# Patient Record
Sex: Male | Born: 1975 | Hispanic: Yes | Marital: Married | Smoking: Former smoker
Health system: Southern US, Community
[De-identification: ages and names within clinical notes are randomized; demographics above are authoritative.]

## PROBLEM LIST (undated history)

## (undated) DIAGNOSIS — G709 Myoneural disorder, unspecified: Secondary | ICD-10-CM

## (undated) DIAGNOSIS — I10 Essential (primary) hypertension: Secondary | ICD-10-CM

## (undated) DIAGNOSIS — E119 Type 2 diabetes mellitus without complications: Secondary | ICD-10-CM

## (undated) HISTORY — PX: UMBILICAL HERNIA REPAIR: SHX196

## (undated) HISTORY — PX: LUMBAR DISC SURGERY: SHX700

---

## 2008-05-14 HISTORY — PX: UMBILICAL HERNIA REPAIR: SHX196

## 2008-05-14 HISTORY — PX: LUMBAR DISC SURGERY: SHX700

## 2009-08-22 ENCOUNTER — Ambulatory Visit: Payer: Self-pay | Admitting: Family Medicine

## 2009-08-26 ENCOUNTER — Ambulatory Visit: Payer: Self-pay | Admitting: Internal Medicine

## 2009-10-07 ENCOUNTER — Ambulatory Visit: Payer: Self-pay | Admitting: Surgery

## 2009-10-11 ENCOUNTER — Ambulatory Visit: Payer: Self-pay | Admitting: Surgery

## 2009-11-16 ENCOUNTER — Ambulatory Visit: Payer: Self-pay | Admitting: Orthopedic Surgery

## 2009-12-05 ENCOUNTER — Encounter: Admission: RE | Admit: 2009-12-05 | Discharge: 2009-12-05 | Payer: Self-pay | Admitting: Neurosurgery

## 2009-12-23 ENCOUNTER — Encounter: Admission: RE | Admit: 2009-12-23 | Discharge: 2009-12-23 | Payer: Self-pay | Admitting: Neurosurgery

## 2010-03-17 ENCOUNTER — Encounter
Admission: RE | Admit: 2010-03-17 | Discharge: 2010-03-17 | Payer: Self-pay | Source: Home / Self Care | Admitting: Neurosurgery

## 2010-04-27 ENCOUNTER — Ambulatory Visit (HOSPITAL_COMMUNITY)
Admission: RE | Admit: 2010-04-27 | Discharge: 2010-04-28 | Payer: Self-pay | Source: Home / Self Care | Attending: Neurosurgery | Admitting: Neurosurgery

## 2010-07-25 LAB — CBC
HCT: 45.9 % (ref 39.0–52.0)
MCHC: 34 g/dL (ref 30.0–36.0)
MCV: 95.6 fL (ref 78.0–100.0)
RDW: 12.6 % (ref 11.5–15.5)

## 2013-03-04 ENCOUNTER — Ambulatory Visit: Payer: Self-pay | Admitting: Internal Medicine

## 2014-09-17 DIAGNOSIS — F17200 Nicotine dependence, unspecified, uncomplicated: Secondary | ICD-10-CM | POA: Insufficient documentation

## 2014-11-23 ENCOUNTER — Emergency Department
Admission: EM | Admit: 2014-11-23 | Discharge: 2014-11-23 | Disposition: A | Discharge status: Home / Self Care | Payer: No Typology Code available for payment source | Attending: Emergency Medicine | Admitting: Emergency Medicine

## 2014-11-23 ENCOUNTER — Emergency Department: Payer: No Typology Code available for payment source

## 2014-11-23 ENCOUNTER — Other Ambulatory Visit: Payer: Self-pay

## 2014-11-23 ENCOUNTER — Encounter: Payer: Self-pay | Admitting: Emergency Medicine

## 2014-11-23 DIAGNOSIS — R079 Chest pain, unspecified: Secondary | ICD-10-CM | POA: Diagnosis present

## 2014-11-23 DIAGNOSIS — E1165 Type 2 diabetes mellitus with hyperglycemia: Secondary | ICD-10-CM | POA: Diagnosis not present

## 2014-11-23 DIAGNOSIS — R0789 Other chest pain: Secondary | ICD-10-CM | POA: Diagnosis not present

## 2014-11-23 DIAGNOSIS — R739 Hyperglycemia, unspecified: Secondary | ICD-10-CM

## 2014-11-23 DIAGNOSIS — Z720 Tobacco use: Secondary | ICD-10-CM | POA: Insufficient documentation

## 2014-11-23 HISTORY — DX: Type 2 diabetes mellitus without complications: E11.9

## 2014-11-23 LAB — CBC
HCT: 44.8 % (ref 40.0–52.0)
Hemoglobin: 15.1 g/dL (ref 13.0–18.0)
MCH: 33.2 pg (ref 26.0–34.0)
MCHC: 33.6 g/dL (ref 32.0–36.0)
MCV: 98.7 fL (ref 80.0–100.0)
PLATELETS: 150 10*3/uL (ref 150–440)
RBC: 4.54 MIL/uL (ref 4.40–5.90)
RDW: 12.6 % (ref 11.5–14.5)
WBC: 14.8 10*3/uL — AB (ref 3.8–10.6)

## 2014-11-23 LAB — BASIC METABOLIC PANEL
ANION GAP: 9 (ref 5–15)
BUN: 13 mg/dL (ref 6–20)
CO2: 25 mmol/L (ref 22–32)
Calcium: 9.3 mg/dL (ref 8.9–10.3)
Chloride: 102 mmol/L (ref 101–111)
Creatinine, Ser: 0.9 mg/dL (ref 0.61–1.24)
GFR calc Af Amer: >60 mL/min (ref >60)
Glucose, Bld: 316 mg/dL — ABNORMAL HIGH (ref 65–99)
POTASSIUM: 4.1 mmol/L (ref 3.5–5.1)
Sodium: 136 mmol/L (ref 135–145)

## 2014-11-23 LAB — TROPONIN I: Troponin I: <0.03 ng/mL (ref <0.031)

## 2014-11-23 MED ORDER — KETOROLAC TROMETHAMINE 30 MG/ML IJ SOLN
30.0000 mg | Freq: Once | INTRAMUSCULAR | Status: AC
  Administered 2014-11-23: 30 mg via INTRAVENOUS
  Filled 2014-11-23: qty 1

## 2014-11-23 MED ORDER — SODIUM CHLORIDE 0.9 % IV SOLN
Freq: Once | INTRAVENOUS | Status: AC
  Administered 2014-11-23: 15:00:00 via INTRAVENOUS

## 2014-11-23 MED ORDER — IBUPROFEN 800 MG PO TABS: 800.0000 mg | ORAL_TABLET | Freq: Three times a day (TID) | ORAL | Status: DC | PRN

## 2014-11-23 MED ORDER — METFORMIN HCL 500 MG PO TABS: 500.0000 mg | ORAL_TABLET | Freq: Two times a day (BID) | ORAL | Status: DC

## 2014-11-23 NOTE — Discharge Instructions (Signed)

## 2014-11-23 NOTE — ED Notes (Signed)
Pt comes to ED POV c/o chest pain bilaterally especially on the lower section of rib cage.  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  He had one moment of sharp pain that radiated towards his back.  Describes an excess amount of diaphoresis compared to his normal when working. Denies shortness of breath, dizziness, or N/V.

## 2014-11-23 NOTE — ED Notes (Signed)
Patient transported to X-ray 

## 2014-11-23 NOTE — ED Provider Notes (Addendum)
[facility] Emergency Department Provider Note     Time seen: ----------------------------------------- 2:25 PM on 11/23/2014 -----------------------------------------    I have reviewed the triage vital signs and the nursing notes.   HISTORY  Chief Complaint Chest Pain    HPI Brendan BaliGabriel Russey is a 39 y.o. male who presents to ER for chest pain bilaterally that started while working on a roof. Patient states his been working on the roof since about Sunday, there is significant heat outside right now. States the pain radiate towards his back is worse when he lays down, had a hard time breathing due to it. Denies any current shortness of breath. Pain seems to be sharp on both sides of the chest and lower axillary area.   Past Medical History  Diagnosis Date  . Diabetes mellitus without complication     There are no active problems to display for this patient.   Past Surgical History  Procedure Laterality Date  . Umbilical hernia repair      Allergies Review of patient's allergies indicates no known allergies.  Social History History  Substance Use Topics  . Smoking status: Current Every Day Smoker -- 0.25 packs/day  . Smokeless tobacco: Not on file  . Alcohol Use: Yes    Review of Systems Constitutional: Negative for fever. Eyes: Negative for visual changes. ENT: Negative for sore throat. Cardiovascular: Positive for chest pain Respiratory: Positive for shortness of breath and orthopnea Gastrointestinal: Negative for abdominal pain, vomiting and diarrhea. Genitourinary: Negative for dysuria. Musculoskeletal: Negative for back pain. Skin: Negative for rash. Neurological: Negative for headaches, focal weakness or numbness.  10-point ROS otherwise negative.  ____________________________________________   PHYSICAL EXAM:  VITAL SIGNS: ED Triage Vitals  Enc Vitals Group     BP --      Pulse --      Resp --      Temp --      Temp  src --      SpO2 --      Weight --      Height --      Head Cir --      Peak Flow --      Pain Score 11/23/14 1417 5     Pain Loc --      Pain Edu? --      Excl. in GC? --     Constitutional: Alert and oriented. Well appearing and in no distress. Eyes: Conjunctivae are normal. PERRL. Normal extraocular movements. ENT   Head: Normocephalic and atraumatic.   Nose: No congestion/rhinnorhea.   Mouth/Throat: Mucous membranes are moist.   Neck: No stridor. Hematological/Lymphatic/Immunilogical: No cervical lymphadenopathy. Cardiovascular: Normal rate, regular rhythm. Normal and symmetric distal pulses are present in all extremities. No murmurs, rubs, or gallops. Respiratory: Normal respiratory effort without tachypnea nor retractions. Breath sounds are clear and equal bilaterally. No wheezes/rales/rhonchi. Gastrointestinal: Soft and nontender. No distention. No abdominal bruits. There is no CVA tenderness. Musculoskeletal: Nontender with normal range of motion in all extremities. Reproducible bilateral inferior axillary rib tenderness Neurologic:  Normal speech and language. No gross focal neurologic deficits are appreciated. Speech is normal. No gait instability. Skin:  Skin is warm, dry and intact. No rash noted. Psychiatric: Mood and affect are normal. Speech and behavior are normal. Patient exhibits appropriate insight and judgment. ____________________________________________  EKG: Interpreted by me. Normal sinus rhythm with normal axis normal intervals. No evidence of hypertrophy or acute infarction. Rate is 80 bpm  ____________________________________________  ED COURSE:  Pertinent labs & imaging results that were available during my care of the patient were reviewed by me and considered in my medical decision making (see chart for details).  ____________________________________________    LABS (pertinent positives/negatives)  Labs Reviewed  BASIC METABOLIC  PANEL - Abnormal; Notable for the following:    Glucose, Bld 316 (*)    All other components within normal limits  CBC  TROPONIN I    RADIOLOGY Images were viewed by me  Chest x-ray is unremarkable  ____________________________________________  FINAL ASSESSMENT AND PLAN  Chest pain, hyperglycemia  Plan: Pain is likely secondary to musculoskeletal strain, heavy physical activity and heat related. Patient is stable for outpatient follow-up, encouraged Motrin as needed for symptoms. We'll need to add metformin, currently is only taking glipizide for his diabetes control.   Emily Filbert, MD   Emily Filbert, MD 11/23/14 1543  Emily Filbert, MD 11/23/14 [phone]

## 2015-08-02 DIAGNOSIS — E782 Mixed hyperlipidemia: Secondary | ICD-10-CM | POA: Insufficient documentation

## 2015-08-19 ENCOUNTER — Encounter: Payer: No Typology Code available for payment source | Attending: Internal Medicine | Admitting: *Deleted

## 2015-08-19 ENCOUNTER — Encounter: Payer: Self-pay | Admitting: *Deleted

## 2015-08-19 VITALS — BP 110/68 | Ht 65.0 in | Wt 200.6 lb

## 2015-08-19 DIAGNOSIS — E119 Type 2 diabetes mellitus without complications: Secondary | ICD-10-CM | POA: Diagnosis not present

## 2015-08-19 NOTE — Patient Instructions (Addendum)
Check blood sugars 2 x day before breakfast and 2 hrs after supper every day Exercise: Begin walking for 15 minutes  3 days a week and increase to 150 minutes/week Nothing strenuous until blood sugars less than 250 Eat 3 meals day,  1-2  snacks a day Space meals 4-6 hours apart Limit fried foods Drink plenty of water Bring blood sugar records to the next appointment Return for appointment on:  Friday September 09, 2015 [facility])

## 2015-08-19 NOTE — Progress Notes (Signed)
Diabetes Self-Management Education  Visit Type: First/Initial  Appt. Start Time: 0925 Appt. End Time: 1050  08/19/2015  [name], identified by name and date of birth, is a 40 y.o. male with a diagnosis of Diabetes: Type 2.   ASSESSMENT  Blood pressure 110/68, height 5\' 5"  (1.651 m), weight 200 lb 9.6 oz (90.992 kg). Body mass index is 33.38 kg/(m^2).      Diabetes Self-Management Education - 08/19/15 1320    Visit Information   Visit Type First/Initial   Initial Visit   Diabetes Type Type 2   Are you currently following a meal plan? No  Pt works out of town for most days and eats out for all meals.    Are you taking your medications as prescribed? No  Pt reports he thought he was taking too many mg of Metformin so he stopped taking Glipizide. Explained the actions of both medications and he needed to continue taking. He hasn't received Bydureon from pharmacy and may not be able to afford.    Date Diagnosed 3 years ago   Health Coping   How would you rate your overall health? Poor   Psychosocial Assessment   Patient Belief/Attitude about Diabetes Afraid  "not good"   Self-care barriers English as a second language   Self-management support Doctor's office;Family   Patient Concerns Nutrition/Meal planning;Medication;Glycemic Control;Problem Solving;Healthy Lifestyle   Special Needs Other (comment)  Able to read and write English but may need some materials in [place].    Preferred Learning Style Auditory   Learning Readiness Ready   How often do you need to have someone help you when you read instructions, pamphlets, or other written materials from your doctor or pharmacy? 2 - Rarely   What is the last grade level you completed in school? 9th   Complications   Last HgB A1C per patient/outside source 11.8 %  08/02/15   How often do you check your blood sugar? 1-2 times/day   Fasting Blood glucose range (mg/dL) >161>200  FBG's [phone] mg/dL   Have you had a dilated eye  exam in the past 12 months? No   Have you had a dental exam in the past 12 months? No   Are you checking your feet? Yes   How many days per week are you checking your feet? 7   Dietary Intake   Breakfast chicken biscuit, oatmeal and fruit    Lunch fast foods - no fries   Dinner steak, hamburger, chicken, fish, with salad, beans, tortilla, rice   Snack (evening) sweet snack   Beverage(s) water, unsweetened coffee, diet soda   Exercise   Exercise Type ADL's   Patient Education   Previous Diabetes Education No   Disease state  Definition of diabetes, type 1 and 2, and the diagnosis of diabetes;Factors that contribute to the development of diabetes   Nutrition management  Role of diet in the treatment of diabetes and the relationship between the three main macronutrients and blood glucose level   Physical activity and exercise  Role of exercise on diabetes management, blood pressure control and cardiac health.   Medications Reviewed patients medication for diabetes, action, purpose, timing of dose and side effects.   Monitoring Purpose and frequency of SMBG.;Identified appropriate SMBG and/or A1C goals.   Acute complications Taught treatment of hypoglycemia - the 15 rule.   Chronic complications Relationship between chronic complications and blood glucose control;Reviewed with patient heart disease, higher risk of, and prevention   Psychosocial adjustment Identified  and addressed patients feelings and concerns about diabetes   Personal strategies to promote health Review risk of smoking and offered smoking cessation;Other (comment)  Alcohol in relation to blood sugars   Individualized Goals (developed by patient)   Reducing Risk Improve blood sugars Prevent diabetes complications Lead a healthier lifestyle Become more fit Quit smoking   Outcomes   Expected Outcomes Demonstrated interest in learning. Expect positive outcomes      Individualized Plan for Diabetes Self-Management  Training:   Learning Objective:  Patient will have a greater understanding of diabetes self-management. Patient education plan is to attend individual and/or group sessions per assessed needs and concerns.   Plan:   Patient Instructions  Check blood sugars 2 x day before breakfast and 2 hrs after supper every day Exercise: Begin walking for 15 minutes  3 days a week and increase to 150 minutes/week Nothing strenuous until blood sugars less than 250 Eat 3 meals day,  1-2  snacks a day Space meals 4-6 hours apart Limit fried foods Drink plenty of water Bring blood sugar records to the next appointment Return for appointment on:  Friday September 09, 2015 at 9:00 am with [facility] (dietitian)   Expected Outcomes:  Demonstrated interest in learning. Expect positive outcomes  Education material provided:  General Meal Planning Guidelines Simple Meal Plan Patient Assistance Paperwork for Bydureon Glucose tablets Symptoms, causes and treatments of Hypoglycemia  If problems or questions, patient to contact team via:   Sharion Settler, RN, CCM, CDE [phone]  Future DSME appointment:  Friday September 09, 2015 with dietitian. Due to his work schedule (he works out of town on most days of the week), he will be seen for 1:1 appointments with the nurse and dietitian.

## 2015-09-09 ENCOUNTER — Ambulatory Visit: Payer: No Typology Code available for payment source | Admitting: Dietician

## 2015-10-05 ENCOUNTER — Encounter: Payer: Self-pay | Admitting: *Deleted

## 2019-10-13 DIAGNOSIS — L03119 Cellulitis of unspecified part of limb: Secondary | ICD-10-CM

## 2019-10-13 DIAGNOSIS — M8610 Other acute osteomyelitis, unspecified site: Secondary | ICD-10-CM

## 2019-10-13 DIAGNOSIS — L02619 Cutaneous abscess of unspecified foot: Secondary | ICD-10-CM

## 2019-10-13 DIAGNOSIS — R7309 Other abnormal glucose: Secondary | ICD-10-CM

## 2019-10-13 DIAGNOSIS — I96 Gangrene, not elsewhere classified: Secondary | ICD-10-CM

## 2019-10-13 HISTORY — DX: Cellulitis of unspecified part of limb: L03.119

## 2019-10-13 HISTORY — DX: Gangrene, not elsewhere classified: I96

## 2019-10-13 HISTORY — DX: Cellulitis of unspecified part of limb: L02.619

## 2019-10-13 HISTORY — DX: Other abnormal glucose: R73.09

## 2019-10-13 HISTORY — DX: Other acute osteomyelitis, unspecified site: M86.10

## 2019-10-17 DIAGNOSIS — E1142 Type 2 diabetes mellitus with diabetic polyneuropathy: Secondary | ICD-10-CM | POA: Insufficient documentation

## 2019-10-26 ENCOUNTER — Other Ambulatory Visit: Payer: Self-pay | Admitting: Internal Medicine

## 2019-10-26 DIAGNOSIS — E11621 Type 2 diabetes mellitus with foot ulcer: Secondary | ICD-10-CM

## 2019-10-26 DIAGNOSIS — E1140 Type 2 diabetes mellitus with diabetic neuropathy, unspecified: Secondary | ICD-10-CM

## 2019-11-03 ENCOUNTER — Other Ambulatory Visit: Payer: Self-pay | Admitting: Podiatry

## 2019-11-04 ENCOUNTER — Encounter
Admission: RE | Admit: 2019-11-04 | Discharge: 2019-11-04 | Disposition: A | Discharge status: Home / Self Care | Payer: Commercial Managed Care - PPO | Source: Ambulatory Visit | Attending: Podiatry | Admitting: Podiatry

## 2019-11-04 ENCOUNTER — Other Ambulatory Visit: Payer: Self-pay

## 2019-11-04 DIAGNOSIS — Z01818 Encounter for other preprocedural examination: Secondary | ICD-10-CM | POA: Diagnosis present

## 2019-11-04 DIAGNOSIS — E118 Type 2 diabetes mellitus with unspecified complications: Secondary | ICD-10-CM | POA: Insufficient documentation

## 2019-11-04 DIAGNOSIS — Z20822 Contact with and (suspected) exposure to covid-19: Secondary | ICD-10-CM | POA: Diagnosis not present

## 2019-11-04 HISTORY — DX: Myoneural disorder, unspecified: G70.9

## 2019-11-04 NOTE — Patient Instructions (Signed)
INSTRUCTIONS FOR SURGERY     Your surgery is scheduled for:   Friday, June 25TH     To find out your arrival time for the day of surgery,          please call [phone] between 1 pm and 3 pm on :  Thursday, June 24TH     When you arrive for surgery, report to the [place].       Do NOT stop on the first floor to register.    REMEMBER: Instructions that are not followed completely may result in serious medical risk,  up to and including death, or upon the discretion of your surgeon and anesthesiologist,            your surgery may need to be rescheduled.  __X__ 1. Do not eat food after midnight the night before your procedure.                    No gum, candy, lozenger, tic tacs, tums or hard candies.                  ABSOLUTELY NOTHING SOLID IN YOUR MOUTH AFTER MIDNIGHT                    You may drink unlimited clear liquids up to 2 hours before you are scheduled to arrive for surgery.                   Do not drink anything within those 2 hours unless you need to take medicine, then take the                   smallest amount you need.  Clear liquids include:  water, apple juice without pulp,                   any flavor Gatorade, Black coffee, black tea.  Sugar may be added but no dairy/ honey /lemon.                        Broth and jello is not considered a clear liquid.  __x__  2. On the morning of surgery, please brush your teeth with toothpaste and water. You may rinse with                  mouthwash if you wish but DO NOT SWALLOW TOOTHPASTE OR MOUTHWASH  __X___3. NO alcohol for 24 hours before or after surgery.  __x___ 4.  Do NOT smoke or use e-cigarettes for 24 HOURS PRIOR TO SURGERY.                      DO NOT Use any chewable tobacco products for at least 6 hours prior to surgery.  __x___ 5. If you start any new medication after this appointment and prior to surgery, please                    Bring it with you on the day of surgery.  ___x__ 6. Notify your doctor if there is any change in  your medical condition, such as fever,                  infection, vomitting, diarrhea or any open sores.  __x___ 7.  USE the CHG SOAP as instructed, the night before surgery and the day of surgery.                   Once you have washed with this soap, do NOT use any of the following: Powders, perfumes                    or lotions. Please do not wear make up, hairpins, clips or nail polish. You may  wear deodorant.                   Men may shave their face and neck.  Women need to shave 48 hours prior to surgery.                   DO NOT wear ANY jewelry on the day of surgery. If there are rings that are too tight to                    remove easily, please address this prior to the surgery day. Piercings need to be removed.                                                                     NO METAL ON YOUR BODY.                    Do NOT bring any valuables.  If you came to Pre-Admit testing then you will not need license,                     insurance card or credit card.  If you will be staying overnight, please either leave your things in                     the car or have your family be responsible for these items.                     [facility] IS NOT RESPONSIBLE FOR BELONGINGS OR VALUABLES.  ___X__ 8. DO NOT wear contact lenses on surgery day.  You may not have dentures,                     Hearing aides, contacts or glasses in the operating room. These items can be                    Placed in the Recovery Room to receive immediately after surgery.  __x___ 9. IF YOU ARE SCHEDULED TO GO HOME ON THE SAME DAY, YOU MUST                   Have someone to drive you home and to stay with you  for the first 24 hours.                    Have an arrangement prior to arriving on surgery day.  ___x__ 10. Take the following medications on  the morning of surgery with a sip of water:                               1.                     2.                      3.                     4.                     5.                     6.  __X___ 11.  Follow any instructions provided to you by your surgeon.                        Such as enema, clear liquid bowel prep                     ##PLEASE DRINK ALL OF THE G2 DRINK AND FINISH IT BY 2 HOURS                        PRIOR TO ARRIVING AT [place].##  __X__  12. STOP ALL ASPIRIN PRODUCTS NOW                       THIS INCLUDES BC POWDERS / GOODIES POWDER  __x___ 13. STOP Anti-inflammatories as of: NOW                      This includes IBUPROFEN / MOTRIN / ADVIL / ALEVE/ NAPROXYN                    YOU MAY TAKE TYLENOL ANY TIME PRIOR TO SURGERY.  ___X__ 14.  Stop supplements until after surgery.                     This includes: OMEGA 3                  __X____16.  Stop Metformin 2 full days prior to surgery.  Stop on: NOW, Wednesday.                  DO NOT TAKE GLIPIZIDE OR METFORMIN ON SURGERY DAY.                   DO TAKE GLIPIZIDE ON Thursday.                     Do NOT take any diabetes medications on surgery day.  ___X___17.  Continue to take the following medications but do not take on the morning of surgery:                          GLIPIZIDE // METFORMIN  ____X__18. If staying overnight, please have appropriate shoes to wear to be able to walk around the unit.                   Wear clean and comfortable clothing to the hospital.  PLEASE BRING PHONE [place].  WEAR  LOOSE AND STRETCHY PANTS OR SHORTS SO YOUR FOOT WILL FIT THRU THE   LEG WITH A LARGE BANDAGE ON IT.

## 2019-11-05 ENCOUNTER — Encounter
Admission: RE | Admit: 2019-11-05 | Discharge: 2019-11-05 | Disposition: A | Discharge status: Home / Self Care | Payer: Commercial Managed Care - PPO | Source: Ambulatory Visit | Attending: Podiatry | Admitting: Podiatry

## 2019-11-05 DIAGNOSIS — Z01818 Encounter for other preprocedural examination: Secondary | ICD-10-CM | POA: Diagnosis not present

## 2019-11-05 LAB — CBC
HCT: 39 % (ref 39.0–52.0)
Hemoglobin: 13.9 g/dL (ref 13.0–17.0)
MCH: 33.1 pg (ref 26.0–34.0)
MCHC: 35.6 g/dL (ref 30.0–36.0)
MCV: 92.9 fL (ref 80.0–100.0)
Platelets: 259 10*3/uL (ref 150–400)
RBC: 4.2 MIL/uL — ABNORMAL LOW (ref 4.22–5.81)
RDW: 11 % — ABNORMAL LOW (ref 11.5–15.5)
WBC: 7.6 10*3/uL (ref 4.0–10.5)
nRBC: 0 % (ref 0.0–0.2)

## 2019-11-05 LAB — SARS CORONAVIRUS 2 (TAT 6-24 HRS): SARS Coronavirus 2: NEGATIVE

## 2019-11-06 ENCOUNTER — Ambulatory Visit: Payer: Commercial Managed Care - PPO | Admitting: Certified Registered Nurse Anesthetist

## 2019-11-06 ENCOUNTER — Ambulatory Visit
Admission: RE | Admit: 2019-11-06 | Discharge: 2019-11-06 | Disposition: A | Discharge status: Home / Self Care | Payer: Commercial Managed Care - PPO | Attending: Podiatry | Admitting: Podiatry

## 2019-11-06 ENCOUNTER — Other Ambulatory Visit: Payer: Self-pay

## 2019-11-06 ENCOUNTER — Encounter: Payer: Self-pay | Admitting: Podiatry

## 2019-11-06 ENCOUNTER — Encounter
Admission: RE | Disposition: A | Discharge status: Home / Self Care | Payer: Self-pay | Source: Home / Self Care | Attending: Podiatry

## 2019-11-06 DIAGNOSIS — E1152 Type 2 diabetes mellitus with diabetic peripheral angiopathy with gangrene: Secondary | ICD-10-CM | POA: Insufficient documentation

## 2019-11-06 DIAGNOSIS — E1165 Type 2 diabetes mellitus with hyperglycemia: Secondary | ICD-10-CM | POA: Diagnosis not present

## 2019-11-06 DIAGNOSIS — Z79899 Other long term (current) drug therapy: Secondary | ICD-10-CM | POA: Insufficient documentation

## 2019-11-06 DIAGNOSIS — Z87891 Personal history of nicotine dependence: Secondary | ICD-10-CM | POA: Insufficient documentation

## 2019-11-06 DIAGNOSIS — E1169 Type 2 diabetes mellitus with other specified complication: Secondary | ICD-10-CM | POA: Insufficient documentation

## 2019-11-06 DIAGNOSIS — E11621 Type 2 diabetes mellitus with foot ulcer: Secondary | ICD-10-CM | POA: Diagnosis not present

## 2019-11-06 DIAGNOSIS — L97522 Non-pressure chronic ulcer of other part of left foot with fat layer exposed: Secondary | ICD-10-CM | POA: Insufficient documentation

## 2019-11-06 DIAGNOSIS — M869 Osteomyelitis, unspecified: Secondary | ICD-10-CM | POA: Insufficient documentation

## 2019-11-06 DIAGNOSIS — Z09 Encounter for follow-up examination after completed treatment for conditions other than malignant neoplasm: Secondary | ICD-10-CM

## 2019-11-06 DIAGNOSIS — L03116 Cellulitis of left lower limb: Secondary | ICD-10-CM | POA: Diagnosis not present

## 2019-11-06 DIAGNOSIS — E1142 Type 2 diabetes mellitus with diabetic polyneuropathy: Secondary | ICD-10-CM | POA: Insufficient documentation

## 2019-11-06 DIAGNOSIS — E11628 Type 2 diabetes mellitus with other skin complications: Secondary | ICD-10-CM | POA: Diagnosis not present

## 2019-11-06 DIAGNOSIS — I96 Gangrene, not elsewhere classified: Secondary | ICD-10-CM | POA: Insufficient documentation

## 2019-11-06 DIAGNOSIS — Z7984 Long term (current) use of oral hypoglycemic drugs: Secondary | ICD-10-CM | POA: Insufficient documentation

## 2019-11-06 HISTORY — PX: AMPUTATION TOE: SHX6595

## 2019-11-06 LAB — GLUCOSE, CAPILLARY
Glucose-Capillary: 114 mg/dL — ABNORMAL HIGH (ref 70–99)
Glucose-Capillary: 107 mg/dL — ABNORMAL HIGH (ref 70–99)

## 2019-11-06 SURGERY — AMPUTATION, TOE
Anesthesia: General | Site: Toe | Laterality: Left

## 2019-11-06 MED ORDER — FENTANYL CITRATE (PF) 100 MCG/2ML IJ SOLN
INTRAMUSCULAR | Status: AC
  Filled 2019-11-06: qty 2

## 2019-11-06 MED ORDER — LACTATED RINGERS IV SOLN: INTRAVENOUS | Status: DC

## 2019-11-06 MED ORDER — BUPIVACAINE HCL (PF) 0.5 % IJ SOLN
INTRAMUSCULAR | Status: AC
  Filled 2019-11-06: qty 30

## 2019-11-06 MED ORDER — FENTANYL CITRATE (PF) 100 MCG/2ML IJ SOLN
INTRAMUSCULAR | Status: DC | PRN
  Administered 2019-11-06 (×2): 25 ug via INTRAVENOUS

## 2019-11-06 MED ORDER — NEOMYCIN-POLYMYXIN B GU 40-200000 IR SOLN
Status: DC | PRN
  Administered 2019-11-06: 2 mL

## 2019-11-06 MED ORDER — MIDAZOLAM HCL 2 MG/2ML IJ SOLN
INTRAMUSCULAR | Status: DC | PRN
  Administered 2019-11-06: 2 mg via INTRAVENOUS

## 2019-11-06 MED ORDER — FENTANYL CITRATE (PF) 100 MCG/2ML IJ SOLN: 25.0000 ug | INTRAMUSCULAR | Status: DC | PRN

## 2019-11-06 MED ORDER — POVIDONE-IODINE 7.5 % EX SOLN
Freq: Once | CUTANEOUS | Status: DC
  Filled 2019-11-06: qty 118

## 2019-11-06 MED ORDER — CHLORHEXIDINE GLUCONATE 0.12 % MT SOLN
15.0000 mL | Freq: Once | OROMUCOSAL | Status: AC
  Administered 2019-11-06: 15 mL via OROMUCOSAL

## 2019-11-06 MED ORDER — NEOMYCIN-POLYMYXIN B GU 40-200000 IR SOLN
Status: AC
  Filled 2019-11-06: qty 2

## 2019-11-06 MED ORDER — BUPIVACAINE HCL 0.5 % IJ SOLN
INTRAMUSCULAR | Status: DC | PRN
  Administered 2019-11-06: 20 mL

## 2019-11-06 MED ORDER — HYDROCODONE-ACETAMINOPHEN 5-325 MG PO TABS: 1.0000 | ORAL_TABLET | Freq: Four times a day (QID) | ORAL | 0 refills | Status: AC | PRN

## 2019-11-06 MED ORDER — CHLORHEXIDINE GLUCONATE 0.12 % MT SOLN
OROMUCOSAL | Status: AC
  Filled 2019-11-06: qty 15

## 2019-11-06 MED ORDER — OXYCODONE HCL 5 MG PO TABS
5.0000 mg | ORAL_TABLET | Freq: Once | ORAL | Status: AC | PRN
  Administered 2019-11-06: 5 mg via ORAL

## 2019-11-06 MED ORDER — OXYCODONE HCL 5 MG PO TABS
ORAL_TABLET | ORAL | Status: AC
  Filled 2019-11-06: qty 1

## 2019-11-06 MED ORDER — ACETAMINOPHEN 10 MG/ML IV SOLN: 1000.0000 mg | Freq: Once | INTRAVENOUS | Status: DC | PRN

## 2019-11-06 MED ORDER — MIDAZOLAM HCL 2 MG/2ML IJ SOLN
INTRAMUSCULAR | Status: AC
  Filled 2019-11-06: qty 2

## 2019-11-06 MED ORDER — CEFAZOLIN SODIUM-DEXTROSE 2-4 GM/100ML-% IV SOLN
INTRAVENOUS | Status: AC
  Filled 2019-11-06: qty 100

## 2019-11-06 MED ORDER — OXYCODONE HCL 5 MG/5ML PO SOLN: 5.0000 mg | Freq: Once | ORAL | Status: AC | PRN

## 2019-11-06 MED ORDER — ONDANSETRON HCL 4 MG/2ML IJ SOLN: 4.0000 mg | Freq: Once | INTRAMUSCULAR | Status: DC | PRN

## 2019-11-06 MED ORDER — PROPOFOL 500 MG/50ML IV EMUL
INTRAVENOUS | Status: DC | PRN
  Administered 2019-11-06: 100 ug/kg/min via INTRAVENOUS

## 2019-11-06 MED ORDER — VANCOMYCIN HCL 1000 MG IV SOLR
INTRAVENOUS | Status: AC
  Filled 2019-11-06: qty 1000

## 2019-11-06 MED ORDER — SODIUM CHLORIDE 0.9 % IV SOLN: INTRAVENOUS | Status: DC

## 2019-11-06 MED ORDER — ORAL CARE MOUTH RINSE: 15.0000 mL | Freq: Once | OROMUCOSAL | Status: AC

## 2019-11-06 MED ORDER — PROPOFOL 10 MG/ML IV BOLUS
INTRAVENOUS | Status: AC
  Filled 2019-11-06: qty 40

## 2019-11-06 MED ORDER — CEFAZOLIN SODIUM-DEXTROSE 2-4 GM/100ML-% IV SOLN
2.0000 g | INTRAVENOUS | Status: AC
  Administered 2019-11-06: 2 g via INTRAVENOUS

## 2019-11-06 MED ORDER — PHENYLEPHRINE HCL (PRESSORS) 10 MG/ML IV SOLN
INTRAVENOUS | Status: DC | PRN
  Administered 2019-11-06: 100 ug via INTRAVENOUS

## 2019-11-06 MED ORDER — ONDANSETRON HCL 4 MG PO TABS: 4.0000 mg | ORAL_TABLET | Freq: Three times a day (TID) | ORAL | 0 refills | Status: AC | PRN

## 2019-11-06 SURGICAL SUPPLY — 45 items
BLADE MED AGGRESSIVE (BLADE) IMPLANT
BLADE OSC/SAGITTAL MD 5.5X18 (BLADE) ×2 IMPLANT
BLADE SURG 15 STRL LF DISP TIS (BLADE) ×1 IMPLANT
BLADE SURG 15 STRL SS (BLADE) ×1
BLADE SURG MINI STRL (BLADE) IMPLANT
BNDG CONFORM 2 STRL LF (GAUZE/BANDAGES/DRESSINGS) IMPLANT
BNDG ELASTIC 4X5.8 VLCR STR LF (GAUZE/BANDAGES/DRESSINGS) ×2 IMPLANT
BNDG ESMARK 4X12 TAN STRL LF (GAUZE/BANDAGES/DRESSINGS) ×2 IMPLANT
BNDG GAUZE 4.5X4.1 6PLY STRL (MISCELLANEOUS) ×2 IMPLANT
CANISTER SUCT 1200ML W/VALVE (MISCELLANEOUS) ×2 IMPLANT
CNTNR SPEC 2.5X3XGRAD LEK (MISCELLANEOUS) ×1
CONT SPEC 4OZ STER OR WHT (MISCELLANEOUS) ×1
CONTAINER SPEC 2.5X3XGRAD LEK (MISCELLANEOUS) ×1 IMPLANT
COVER WAND RF STERILE (DRAPES) ×2 IMPLANT
CUFF TOURN 18 STER (MISCELLANEOUS) IMPLANT
CUFF TOURN DUAL PL 12 NO SLV (MISCELLANEOUS) ×2 IMPLANT
DRAPE FLUOR MINI C-ARM 54X84 (DRAPES) IMPLANT
DURAPREP 26ML APPLICATOR (WOUND CARE) ×2 IMPLANT
ELECT REM PT RETURN 9FT ADLT (ELECTROSURGICAL) ×2
ELECTRODE REM PT RTRN 9FT ADLT (ELECTROSURGICAL) ×1 IMPLANT
GAUZE SPONGE 4X4 12PLY STRL (GAUZE/BANDAGES/DRESSINGS) ×2 IMPLANT
GAUZE XEROFORM 1X8 LF (GAUZE/BANDAGES/DRESSINGS) ×2 IMPLANT
GLOVE BIO SURGEON STRL SZ7 (GLOVE) ×2 IMPLANT
GLOVE INDICATOR 7.0 STRL GRN (GLOVE) ×2 IMPLANT
GOWN STRL REUS W/ TWL LRG LVL3 (GOWN DISPOSABLE) ×2 IMPLANT
GOWN STRL REUS W/TWL LRG LVL3 (GOWN DISPOSABLE) ×2
HANDPIECE VERSAJET DEBRIDEMENT (MISCELLANEOUS) IMPLANT
KIT TURNOVER KIT A (KITS) ×2 IMPLANT
LABEL OR SOLS (LABEL) ×2 IMPLANT
NEEDLE FILTER BLUNT 18X 1/2SAF (NEEDLE) ×1
NEEDLE FILTER BLUNT 18X1 1/2 (NEEDLE) ×1 IMPLANT
NEEDLE HYPO 25X1 1.5 SAFETY (NEEDLE) ×4 IMPLANT
NS IRRIG 500ML POUR BTL (IV SOLUTION) ×2 IMPLANT
PACK EXTREMITY (MISCELLANEOUS) ×2 IMPLANT
SOL .9 NS 3000ML IRR  AL (IV SOLUTION)
SOL .9 NS 3000ML IRR UROMATIC (IV SOLUTION) IMPLANT
SOL PREP PVP 2OZ (MISCELLANEOUS) ×2
SOLUTION PREP PVP 2OZ (MISCELLANEOUS) ×1 IMPLANT
STOCKINETTE STRL 6IN 960660 (GAUZE/BANDAGES/DRESSINGS) ×2 IMPLANT
SUT ETHILON 3-0 FS-10 30 BLK (SUTURE) ×4
SUT VIC AB 3-0 SH 27 (SUTURE) ×1
SUT VIC AB 3-0 SH 27X BRD (SUTURE) ×1 IMPLANT
SUTURE EHLN 3-0 FS-10 30 BLK (SUTURE) ×2 IMPLANT
SWAB CULTURE AMIES ANAERIB BLU (MISCELLANEOUS) ×2 IMPLANT
SYR 10ML LL (SYRINGE) ×2 IMPLANT

## 2019-11-06 NOTE — Anesthesia Preprocedure Evaluation (Signed)
Anesthesia Evaluation  Patient identified by MRN, date of birth, ID band Patient awake    Reviewed: Allergy & Precautions, NPO status , Patient's Chart, lab work & pertinent test results  History of Anesthesia Complications Negative for: history of anesthetic complications  Airway Mallampati: II  TM Distance: >3 FB Neck ROM: Full    Dental  (+) Teeth Intact, Chipped, Dental Advisory Given   Pulmonary neg pulmonary ROS, neg sleep apnea, neg COPD, Patient abstained from smoking.Not current smoker, former smoker,    Pulmonary exam normal breath sounds clear to auscultation       Cardiovascular Exercise Tolerance: Good METS(-) hypertension(-) CAD and (-) Past MI negative cardio ROS  (-) dysrhythmias  Rhythm:Regular Rate:Normal - Systolic murmurs    Neuro/Psych negative neurological ROS  negative psych ROS   GI/Hepatic neg GERD  ,(+)     (-) substance abuse  ,   Endo/Other  diabetes  Renal/GU negative Renal ROS     Musculoskeletal   Abdominal   Peds  Hematology   Anesthesia Other Findings Past Medical History: 10/2019: Acute osteomyelitis (HCC)     Comment:  phalanx left foot 10/2019: Cellulitis and abscess of foot     Comment:  left.  using betadine wet to dry dressings.  treated               with multiple antibiotics. + pseudomonas No date: Diabetes mellitus without complication (HCC) 10/2019: Elevated hemoglobin A1c     Comment:  results are 12.5% 10/2019: Gangrene (HCC)     Comment:  left toe No date: Neuromuscular disorder (HCC)     Comment:  diabetic neuropathy  Reproductive/Obstetrics                             Anesthesia Physical Anesthesia Plan  ASA: II  Anesthesia Plan: General   Post-op Pain Management:    Induction: Intravenous  PONV Risk Score and Plan: 2 and Ondansetron, Propofol infusion, TIVA and Midazolam  Airway Management Planned: Nasal  Cannula  Additional Equipment: None  Intra-op Plan:   Post-operative Plan:   Informed Consent: I have reviewed the patients History and Physical, chart, labs and discussed the procedure including the risks, benefits and alternatives for the proposed anesthesia with the patient or authorized representative who has indicated his/her understanding and acceptance.     Dental advisory given  Plan Discussed with: CRNA and Surgeon  Anesthesia Plan Comments: (Discussed risks of anesthesia with patient, including possibility of difficulty with spontaneous ventilation under anesthesia necessitating airway intervention, PONV, and rare risks such as cardiac or respiratory or neurological events. Patient understands. Interview conducted in presence of in-person Spanish interpreter)        Anesthesia Quick Evaluation

## 2019-11-06 NOTE — H&P (Signed)
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  DIABETES TYPE 2 WITH POLYNEUROPATHY.   The various methods of treatment have been discussed with the patient.  No guarantees were given.  After consideration of risks, benefits and other options for treatment, the patient has consented to surgery.  I have reviewed the patients' chart and labs.    PROCEDURE: LEFT HALLUX PARTIAL AMPUTATION  A history and physical examination was performed in my office.  The patient was reexamined.  There have been no changes to this history and physical examination.  Rosetta Posner, DPM

## 2019-11-06 NOTE — Discharge Instructions (Signed)
[facility]ERY [facility]  POST OPERATIVE INSTRUCTIONS FOR DR. TROXLER, DR. Ether Griffins, AND [facility] P[facility]PART[facility]   1. Take your medication as prescribed.  Pain medication should be taken only as needed.  Continue taking antibiotics as prescribed until gone.  2. Keep the dressing clean, dry and intact.  3. Keep your foot elevated above the heart level for the first 48 hours.  4. Walking to the bathroom and brief periods of walking are acceptable, unless we have instructed you to be non-weight bearing.  5. Always wear your post-op shoe when walking.  Always use your crutches if you are to be non-weight bearing.  6. Do not take a shower. Baths are permissible as long as the foot is kept out of the water.   7. Every hour you are awake:  - Bend your knee 15 times. - Flex foot 15 times - Massage calf 15 times  8. Call [facility] [phone]) if any of the following problems occur: - You develop a temperature or fever. - The bandage becomes saturated with blood. - Medication does not stop your pain. - Injury of the foot occurs. - Any symptoms of infection including redness, odor, or red streaks running from wound.   AMBULATORY SURGERY  DISCHARGE INSTRUCTIONS   1) The drugs that you were given will stay in your system until tomorrow so for the next 24 hours you should not:  A) Drive an automobile B) Make any legal decisions C) Drink any alcoholic beverage   2) You may resume regular meals tomorrow.  Today it is better to start with liquids and gradually work up to solid foods.  You may eat anything you prefer, but it is better to start with liquids, then soup and crackers, and gradually work up to solid foods.   3) Please notify your doctor immediately if you have any unusual bleeding, trouble breathing, redness and pain at the surgery site, drainage, fever, or pain not relieved by medication.    4) Additional  Instructions:        Please contact your physician with any problems or Same Day Surgery at [phone], Monday through Friday 6 am to 4 pm, or [place] at [facility] number at [phone].

## 2019-11-06 NOTE — Op Note (Signed)
PODIATRY / FOOT AND ANKLE SURGERY OPERATIVE REPORT    SURGEON: Caroline More, DPM  PRE-OPERATIVE DIAGNOSIS:  1. 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 DM2 with polyneuropathy, uncontrolled  POST-OPERATIVE DIAGNOSIS: same  PROCEDURE(S): 1. Left partial hallux amputation  HEMOSTASIS: left ankle tourniquet, 20 minutes  ANESTHESIA: MAC  ESTIMATED BLOOD LOSS: 10 cc  FINDING(S): 1.  L hallux necrosis distally at the distal phalanx to the IPJ  PATHOLOGY/SPECIMEN(S):  L hallux wound culture, bone path with proximal margin of head PP marked in ink  INDICATIONS:   Brendan Pruitt is a 44 y.o. male who presents with an ulceration to the left hallux distally.  Patient has had this ulceration for a number of weeks now and presented to clinic for further evaluation and care after being seen by his PCP.  X-rays were taken at previous visit showing osteomyelitic changes to the distal phalanx of the left big toe.  Discussed all treatment options with the patient both conservative and surgical attempts at correction clean potential risks and complications and at this time patient is elected for surgery consisting of left hallux partial amputation.  Discussed importance of strict blood glucose control.  DESCRIPTION: After obtaining full informed written consent, the patient was brought back to the operating room and placed supine upon the operating table.  The patient received IV antibiotics prior to induction.  After obtaining adequate anesthesia a preoperative block was performed with 10 cc of half percent Marcaine plain the left hallux digital block.  An ankle tourniquet was placed on the left side.  The patient was prepped and draped in the standard fashion.  An Esmarch bandage was used to exsanguinate the left lower extremity pneumatic ankle tourniquet was inflated.  Attention was directed to the left big toe where a fishmouth incision was made stating more dorsally than  plantarly slightly proximal to the IPJ hallux.  The incision was made directly to bone.  The incision was made in such a way to remove all necrotic and nonviable tissue that was distal to the incision line.  The IPJ was identified and extensor tenotomy capsulotomy was performed followed by release the collateral ligaments and any connection to the plantar plate/flexor tendon.  The IPJ hallux was disarticulated and the distal phalanx was passed off in the operative site for pathology.  A wound culture was also taken from the distal phalanx at that time.  Circumferential dissection was then performed around the head of the proximal phalanx at the operative site to the midshaft level.  The extensor tendon and flexor tendon was cut as far proximally as possible.  A sagittal bone saw was then used to resect the head and distal shaft of the proximal phalanx to the level of the midshaft portion.  The bone appeared to be hard in nature to this level and had no evidence of osteomyelitis.  The bone fragment was passed off in the operative site and colored in purple ink to mark the proximal margin.  The toe was then passed off and called left great toe with proximal margin marked in ink.  The surgical site was flushed with copious amounts normal sterile saline.  The skin was then reapproximated well coapted with with 3-0 nylon in a combination of simple and horizontal mattress type stitching.  An additional 10 cc of half percent Marcaine plain was injected about the operative site and as a first ray block.  The pneumatic ankle tourniquet was deflated and a prompt hyperemic response was  noted all digits left foot.  A postoperative dressing was applied consisting of Xeroform followed by 4 x 4 gauze, Kerlix, Coban.  The patient tolerated the procedure and anesthesia well was transferred to recovery room vital signs stable vascular status intact to all toes left foot that remained.  The patient was transferred to the  recovery room without incident.  Patient will be discharged home with the appropriate follow-up information and discharge instructions with the appropriate medications.  COMPLICATIONS: None  CONDITION: Good, stable  Caroline More, DPM

## 2019-11-06 NOTE — Transfer of Care (Signed)
Immediate Anesthesia Transfer of Care Note  Patient: Brendan Pruitt  Procedure(s) Performed: AMPUTATION TOE IPJ LEFT HALLUX (Left Toe)  Patient Location: PACU  Anesthesia Type:General  Level of Consciousness: awake, alert  and oriented  Airway & Oxygen Therapy: Patient Spontanous Breathing  Post-op Assessment: Report given to RN  Post vital signs: Reviewed and stable  Last Vitals:  Vitals Value Taken Time  BP 106/65 11/06/19 1311  Temp 36.5 C 11/06/19 1311  Pulse 81 11/06/19 1311  Resp 3 11/06/19 1311  SpO2 96 % 11/06/19 1311    Last Pain:  Vitals:   11/06/19 1045  TempSrc: Tympanic  PainSc: 0-No pain         Complications: No complications documented.

## 2019-11-07 NOTE — Anesthesia Postprocedure Evaluation (Signed)
Anesthesia Post Note  Patient: Brendan Pruitt  Procedure(s) Performed: AMPUTATION TOE IPJ LEFT HALLUX (Left Toe)  Patient location during evaluation: PACU Anesthesia Type: General Level of consciousness: awake and alert Pain management: pain level controlled Vital Signs Assessment: post-procedure vital signs reviewed and stable Respiratory status: spontaneous breathing, nonlabored ventilation, respiratory function stable and patient connected to nasal cannula oxygen Cardiovascular status: blood pressure returned to baseline and stable Postop Assessment: no apparent nausea or vomiting Anesthetic complications: no   No complications documented.   Last Vitals:  Vitals:   11/06/19 1403 11/06/19 1445  BP: (!) 131/97 107/84  Pulse: 77 79  Resp: 18 18  Temp:    SpO2: 100% 100%    Last Pain:  Vitals:   11/06/19 1445  TempSrc:   PainSc: 0-No pain                 Corinda Gubler

## 2019-11-08 ENCOUNTER — Encounter: Payer: Self-pay | Admitting: Podiatry

## 2019-11-10 LAB — SURGICAL PATHOLOGY

## 2019-11-11 LAB — AEROBIC/ANAEROBIC CULTURE W GRAM STAIN (SURGICAL/DEEP WOUND): Gram Stain: NONE SEEN

## 2019-11-13 ENCOUNTER — Other Ambulatory Visit: Payer: Self-pay

## 2019-11-13 ENCOUNTER — Emergency Department: Payer: Commercial Managed Care - PPO

## 2019-11-13 ENCOUNTER — Encounter: Payer: Self-pay | Admitting: *Deleted

## 2019-11-13 ENCOUNTER — Emergency Department
Admission: EM | Admit: 2019-11-13 | Discharge: 2019-11-13 | Disposition: A | Discharge status: Home / Self Care | Payer: Commercial Managed Care - PPO | Attending: Emergency Medicine | Admitting: Emergency Medicine

## 2019-11-13 DIAGNOSIS — Z87891 Personal history of nicotine dependence: Secondary | ICD-10-CM | POA: Insufficient documentation

## 2019-11-13 DIAGNOSIS — K5900 Constipation, unspecified: Secondary | ICD-10-CM | POA: Diagnosis not present

## 2019-11-13 DIAGNOSIS — E1140 Type 2 diabetes mellitus with diabetic neuropathy, unspecified: Secondary | ICD-10-CM | POA: Diagnosis not present

## 2019-11-13 DIAGNOSIS — Z7984 Long term (current) use of oral hypoglycemic drugs: Secondary | ICD-10-CM | POA: Insufficient documentation

## 2019-11-13 DIAGNOSIS — R1084 Generalized abdominal pain: Secondary | ICD-10-CM | POA: Diagnosis not present

## 2019-11-13 DIAGNOSIS — R109 Unspecified abdominal pain: Secondary | ICD-10-CM | POA: Diagnosis present

## 2019-11-13 MED ORDER — LACTULOSE 10 GM/15ML PO SOLN
20.0000 g | Freq: Every day | ORAL | 0 refills | Status: DC | PRN
Start: 2019-11-13 — End: 2020-11-18

## 2019-11-13 MED ORDER — LACTULOSE 10 GM/15ML PO SOLN
30.0000 g | Freq: Once | ORAL | Status: AC
  Administered 2019-11-13: 30 g via ORAL
  Filled 2019-11-13: qty 60

## 2019-11-13 NOTE — ED Provider Notes (Signed)
[facility] Emergency Department Provider Note   ____________________________________________   First MD Initiated Contact with Patient 11/13/19 [phone]     (approximate)  I have reviewed the triage vital signs and the nursing notes.   HISTORY  Chief Complaint Constipation   HPI Brendan Pruitt is a 44 y.o. male who presents to the ED from home with a chief complaint of abdominal pain, constipation and difficulty urinating.  Patient had recent foot surgery and has been taking Norco for pain.  Has not had a BM x2 days; usually has a daily BM.  Saw his podiatrist in follow-up 2 days ago who recommended he take senna.  Patient denies fever, chills, cough, chest pain, shortness of breath, nausea or vomiting.  Has noted difficulty urinating and states he dribbles in the night.       Past Medical History:  Diagnosis Date  . Acute osteomyelitis (HCC) 10/2019   phalanx left foot  . Cellulitis and abscess of foot 10/2019   left.  using betadine wet to dry dressings.  treated with multiple antibiotics. + pseudomonas  . Diabetes mellitus without complication (HCC)   . Elevated hemoglobin A1c 10/2019   results are 12.5%  . Gangrene (HCC) 10/2019   left toe  . Neuromuscular disorder (HCC)    diabetic neuropathy    There are no problems to display for this patient.   Past Surgical History:  Procedure Laterality Date  . AMPUTATION TOE Left 11/06/2019   Procedure: AMPUTATION TOE IPJ LEFT HALLUX;  Surgeon: Rosetta Posner, DPM;  Location: [facility] ORS;  Service: Podiatry;  Laterality: Left;  . LUMBAR DISC SURGERY  2010   no metal  . UMBILICAL HERNIA REPAIR  2010   mesh     Prior to Admission medications   Medication Sig Start Date End Date Taking? Authorizing Provider  ciprofloxacin (CIPRO) 500 MG tablet Take 500 mg by mouth 2 (two) times daily.    [provider]  doxycycline (VIBRAMYCIN) 100 MG capsule Take 100 mg by mouth 2 (two) times daily. 10/23/19    [provider]  gabapentin (NEURONTIN) 100 MG capsule Take 100 mg by mouth 2 (two) times daily.  10/23/19   [provider]  glipiZIDE (GLUCOTROL XL) 10 MG 24 hr tablet Take 10 mg by mouth 2 (two) times daily. 10/17/19   [provider]  HYDROcodone-acetaminophen (NORCO/VICODIN) 5-325 MG tablet Take 1 tablet by mouth every 6 (six) hours as needed for up to 7 days (pain.). 11/06/19 11/13/19  Rosetta Posner, DPM  lactulose (CHRONULAC) 10 GM/15ML solution Take 30 mLs (20 g total) by mouth daily as needed for mild constipation. 11/13/19   Irean Hong, MD  metFORMIN (GLUCOPHAGE) 1000 MG tablet Take 1,000 mg by mouth 2 (two) times daily with a meal.  08/05/15   [provider]  Omega-3 Fatty Acids (OMEGA 3 PO) Take 1,080 mg by mouth in the morning and at bedtime.    [provider]  ondansetron (ZOFRAN) 4 MG tablet Take 1 tablet (4 mg total) by mouth every 8 (eight) hours as needed for up to 7 days for nausea or vomiting. 11/06/19 11/13/19  Rosetta Posner, DPM  OZEMPIC, 1 MG/DOSE, 4 MG/3ML SOPN Inject 1 mg into the skin every Sunday.  10/23/19   [provider]  povidone-Iodine (BETADINE) 5 % SOLN topical solution Apply 1 application topically as needed for wound care.    [provider]    Allergies Patient has no known allergies.  Family History  Problem Relation Age of Onset  . Diabetes Mother   . Diabetes Sister     Social History Social History   Tobacco Use  . Smoking status: Former Smoker    Packs/day: 0.25    Years: 4.00    Pack years: 1.00    Types: Cigarettes    Quit date: 2016    Years since quitting: 5.5  . Smokeless tobacco: Never Used  Vaping Use  . Vaping Use: Never used  Substance Use Topics  . Alcohol use: Yes    Alcohol/week: 20.0 standard drinks    Types: 20 Cans of beer per week    Comment: does not drink much lately  . Drug use: No    Review of Systems  Constitutional: No fever/chills Eyes: No visual  changes. ENT: No sore throat. Cardiovascular: Denies chest pain. Respiratory: Denies shortness of breath. Gastrointestinal: Positive for abdominal pain.  No nausea, no vomiting.  No diarrhea.  Positive for constipation. Genitourinary: Negative for dysuria. Musculoskeletal: Negative for back pain. Skin: Negative for rash. Neurological: Negative for headaches, focal weakness or numbness.   ____________________________________________   PHYSICAL EXAM:  VITAL SIGNS: ED Triage Vitals  Enc Vitals Group     BP 11/13/19 0435 (!) 138/106     Pulse Rate 11/13/19 0435 (!) 103     Resp 11/13/19 0435 18     Temp --      Temp src --      SpO2 11/13/19 0435 100 %     Weight 11/13/19 0436 180 lb (81.6 kg)     Height 11/13/19 0436 5\' 5"  (1.651 m)     Head Circumference --      Peak Flow --      Pain Score 11/13/19 0437 10     Pain Loc --      Pain Edu? --      Excl. in GC? --     Constitutional: Alert and oriented. Well appearing and in mild acute distress. Eyes: Conjunctivae are normal. PERRL. EOMI. Head: Atraumatic. Nose: No congestion/rhinnorhea. Mouth/Throat: Mucous membranes are moist.  Oropharynx non-erythematous. Neck: No stridor.   Cardiovascular: Normal rate, regular rhythm. Grossly normal heart sounds.  Good peripheral circulation. Respiratory: Normal respiratory effort.  No retractions. Lungs CTAB. Gastrointestinal: Soft and mildly diffusely tender to palpation without rebound or guarding.  Mild distention. No abdominal bruits. No CVA tenderness. Musculoskeletal: Left foot in bandage from podiatric surgery.  No lower extremity tenderness nor edema.  No joint effusions. Neurologic:  Normal speech and language. No gross focal neurologic deficits are appreciated. No gait instability. Skin:  Skin is warm, dry and intact. No rash noted. Psychiatric: Mood and affect are normal. Speech and behavior are normal.  ____________________________________________   LABS (all labs  ordered are listed, but only abnormal results are displayed)  Labs Reviewed - No data to display ____________________________________________  EKG  None ____________________________________________  RADIOLOGY  ED MD interpretation: No acute cardiopulmonary process; moderate to large stool burden  Official radiology report(s): DG Abd Acute W/Chest  Result Date: 11/13/2019 CLINICAL DATA:  Abdominal pain and distension. EXAM: DG ABDOMEN ACUTE W/ 1V CHEST COMPARISON:  Chest x-ray 11/23/2014 FINDINGS: The cardiac silhouette, mediastinal and hilar contours are within normal limits. The lungs are clear of an acute process. No worrisome pulmonary lesions. No pleural effusion. The bony thorax is intact. Two views of the abdomen demonstrate moderate to large amount of stool throughout the colon and down into the rectum suggesting constipation and  possible fecal impaction. No distended small bowel loops to suggest obstruction. The soft tissue shadows are maintained. No worrisome calcifications. The bony structures are unremarkable. IMPRESSION: 1. No acute cardiopulmonary findings. 2. Moderate to large amount of stool throughout the colon suggesting constipation and possible fecal impaction. Electronically Signed   By: Rudie Meyer M.D.   On: 11/13/2019 05:09    ____________________________________________   PROCEDURES  Procedure(s) performed (including Critical Care):  Procedures   ____________________________________________   INITIAL IMPRESSION / ASSESSMENT AND PLAN / ED COURSE  As part of my medical decision making, I reviewed the following data within the electronic MEDICAL RECORD NUMBER Nursing notes reviewed and incorporated, Old chart reviewed, Radiograph reviewed and Notes from prior ED visits     Brendan Pruitt was evaluated in Emergency Department on 11/13/2019 for the symptoms described in the history of present illness. He was evaluated in the context of the global COVID-19 pandemic,  which necessitated consideration that the patient might be at risk for infection with the SARS-CoV-2 virus that causes COVID-19. Institutional protocols and algorithms that pertain to the evaluation of patients at risk for COVID-19 are in a state of rapid change based on information released by regulatory bodies including the CDC and federal and state organizations. These policies and algorithms were followed during the patient's care in the ED.    44 year old male presenting with constipation and difficulty urinating in the setting of taking narcotic pain medicine status post recent left foot surgery.  Differential diagnosis includes but is not limited to constipation secondary to narcotic pain medication, ileus, SBO, urinary retention, etc.  Will obtain plain film x-rays and bladder scan.   Clinical Course as of Nov 13 639  Fri Nov 13, 2019  2035 Bladder scan 294 mL; patient voiding now.  Updated him on imaging results.  Will discharge home on Lactulose and recommendation for daily bowel regimen.  Strict return precautions given.  Patient verbalizes understanding and agrees with plan of care.   [JS]    Clinical Course User Index [JS] Irean Hong, MD     ____________________________________________   FINAL CLINICAL IMPRESSION(S) / ED DIAGNOSES  Final diagnoses:  Generalized abdominal pain  Constipation, unspecified constipation type     ED Discharge Orders         Ordered    lactulose (CHRONULAC) 10 GM/15ML solution  Daily PRN     Discontinue  Reprint     11/13/19 0530           Note:  This document was prepared using Dragon voice recognition software and may include unintentional dictation errors.   Irean Hong, MD 11/13/19 [phone]

## 2019-11-13 NOTE — Discharge Instructions (Addendum)
1.  Take Lactulose as needed for bowel movements. 2.  I recommend the following over-the-counter medications to regulate daily bowel movements: MiraLAX Stool softener (you may continue Senna) Fiber Plenty of fluids 3.  Return to the ER for worsening symptoms, persistent vomiting, difficulty breathing or other concerns.

## 2019-11-13 NOTE — ED Notes (Signed)
Pt reports no bowel movement since this Wednesday. Pt reports some ability to urinate, but unable to urinate a "normal amount"  Pt reports recent foot surgery to left foot. Pt reports taking stool softener.   Presents with mild abdominal distention

## 2019-11-13 NOTE — ED Triage Notes (Addendum)
Pt arrives ambulatory to triage, c/o constipation for about 3 days and difficulty urinating through the night "only a little bit at a time". C/o generalized abdominal pain. Pt has been taking norco and a stool softener since his surgery.

## 2019-11-15 ENCOUNTER — Emergency Department
Admission: EM | Admit: 2019-11-15 | Discharge: 2019-11-15 | Disposition: A | Discharge status: Home / Self Care | Payer: Commercial Managed Care - PPO | Attending: Emergency Medicine | Admitting: Emergency Medicine

## 2019-11-15 ENCOUNTER — Encounter: Payer: Self-pay | Admitting: Emergency Medicine

## 2019-11-15 ENCOUNTER — Emergency Department: Payer: Commercial Managed Care - PPO

## 2019-11-15 ENCOUNTER — Other Ambulatory Visit: Payer: Self-pay

## 2019-11-15 DIAGNOSIS — E1140 Type 2 diabetes mellitus with diabetic neuropathy, unspecified: Secondary | ICD-10-CM | POA: Insufficient documentation

## 2019-11-15 DIAGNOSIS — Z87891 Personal history of nicotine dependence: Secondary | ICD-10-CM | POA: Diagnosis not present

## 2019-11-15 DIAGNOSIS — K5900 Constipation, unspecified: Secondary | ICD-10-CM | POA: Insufficient documentation

## 2019-11-15 DIAGNOSIS — K5903 Drug induced constipation: Secondary | ICD-10-CM

## 2019-11-15 DIAGNOSIS — Z7984 Long term (current) use of oral hypoglycemic drugs: Secondary | ICD-10-CM | POA: Insufficient documentation

## 2019-11-15 DIAGNOSIS — R339 Retention of urine, unspecified: Secondary | ICD-10-CM | POA: Insufficient documentation

## 2019-11-15 DIAGNOSIS — R1084 Generalized abdominal pain: Secondary | ICD-10-CM | POA: Diagnosis present

## 2019-11-15 LAB — COMPREHENSIVE METABOLIC PANEL
ALT: 28 U/L (ref 0–44)
AST: 27 U/L (ref 15–41)
Albumin: 4 g/dL (ref 3.5–5.0)
Alkaline Phosphatase: 81 U/L (ref 38–126)
Anion gap: 11 (ref 5–15)
BUN: 13 mg/dL (ref 6–20)
CO2: 25 mmol/L (ref 22–32)
Calcium: 9.1 mg/dL (ref 8.9–10.3)
Chloride: 97 mmol/L — ABNORMAL LOW (ref 98–111)
Creatinine, Ser: 0.94 mg/dL (ref 0.61–1.24)
GFR calc Af Amer: >60 mL/min (ref >60)
GFR calc non Af Amer: >60 mL/min (ref >60)
Glucose, Bld: 137 mg/dL — ABNORMAL HIGH (ref 70–99)
Potassium: 3.6 mmol/L (ref 3.5–5.1)
Sodium: 133 mmol/L — ABNORMAL LOW (ref 135–145)
Total Bilirubin: 0.8 mg/dL (ref 0.3–1.2)
Total Protein: 7.7 g/dL (ref 6.5–8.1)

## 2019-11-15 LAB — URINALYSIS, COMPLETE (UACMP) WITH MICROSCOPIC
Bilirubin Urine: NEGATIVE
Glucose, UA: NEGATIVE mg/dL
Hgb urine dipstick: NEGATIVE
Ketones, ur: NEGATIVE mg/dL
Leukocytes,Ua: NEGATIVE
Nitrite: NEGATIVE
Protein, ur: NEGATIVE mg/dL
Specific Gravity, Urine: 1.02 (ref 1.005–1.030)
pH: 7 (ref 5.0–8.0)

## 2019-11-15 LAB — CBC
HCT: 37.8 % — ABNORMAL LOW (ref 39.0–52.0)
Hemoglobin: 13.4 g/dL (ref 13.0–17.0)
MCH: 33 pg (ref 26.0–34.0)
MCHC: 35.4 g/dL (ref 30.0–36.0)
MCV: 93.1 fL (ref 80.0–100.0)
Platelets: 197 10*3/uL (ref 150–400)
RBC: 4.06 MIL/uL — ABNORMAL LOW (ref 4.22–5.81)
RDW: 11 % — ABNORMAL LOW (ref 11.5–15.5)
WBC: 10.2 10*3/uL (ref 4.0–10.5)
nRBC: 0 % (ref 0.0–0.2)

## 2019-11-15 LAB — LIPASE, BLOOD: Lipase: 43 U/L (ref 11–51)

## 2019-11-15 MED ORDER — IOHEXOL 300 MG/ML  SOLN
100.0000 mL | Freq: Once | INTRAMUSCULAR | Status: AC | PRN
  Administered 2019-11-15: 100 mL via INTRAVENOUS

## 2019-11-15 MED ORDER — LUBIPROSTONE 24 MCG PO CAPS: 24.0000 ug | ORAL_CAPSULE | Freq: Two times a day (BID) | ORAL | 0 refills | Status: DC

## 2019-11-15 MED ORDER — ONDANSETRON HCL 4 MG/2ML IJ SOLN
4.0000 mg | INTRAMUSCULAR | Status: AC
  Administered 2019-11-15: 4 mg via INTRAVENOUS
  Filled 2019-11-15: qty 2

## 2019-11-15 MED ORDER — IOHEXOL 9 MG/ML PO SOLN
500.0000 mL | Freq: Two times a day (BID) | ORAL | Status: DC | PRN
  Administered 2019-11-15: 500 mL via ORAL

## 2019-11-15 MED ORDER — GOLYTELY 236 G PO SOLR
4.0000 L | Freq: Once | ORAL | 0 refills | Status: AC
Start: 2019-11-15 — End: 2019-11-15

## 2019-11-15 NOTE — ED Notes (Signed)
Pt in CT scan, report received.

## 2019-11-15 NOTE — ED Notes (Signed)
Pt remains to sit on toilet to attempt to have bowel movement.

## 2019-11-15 NOTE — ED Provider Notes (Signed)
[facility] Emergency Department Provider Note  ____________________________________________   First MD Initiated Contact with Patient 11/15/19 [phone]     (approximate)  I have reviewed the triage vital signs and the nursing notes.   HISTORY  Chief Complaint Abdominal Pain    HPI Brendan Pruitt is a 44 y.o. male    who presents for evaluation of generalized abdominal pain, difficulty urinating, and urinary retention.  The symptoms have been gradually worsening over the last for 5 days.  His constipation is thought to be secondary to narcotic pain medicine for recent surgery on his left foot.  He was seen in the ED 2 days ago for same symptoms.  He had an acute abdomen series of x-rays and lab work which was all reassuring.  He claims that since his ED visit he has taken lactulose, MiraLAX, Colace, and senna, and nothing has worked.  He claims that he passes a lot of gas but has not passed any stool in days.  he claims it is getting increasingly difficult to urinate even though he has the urge to do so.  He has no back pain.  He denies fever/chills, sore throat, chest pain, nausea, vomiting.  He describes the symptoms as severe.  He says that the abdominal pain is generalized, aching, and dull and sometimes cramping.        Past Medical History:  Diagnosis Date  . Acute osteomyelitis (HCC) 10/2019   phalanx left foot  . Cellulitis and abscess of foot 10/2019   left.  using betadine wet to dry dressings.  treated with multiple antibiotics. + pseudomonas  . Diabetes mellitus without complication (HCC)   . Elevated hemoglobin A1c 10/2019   results are 12.5%  . Gangrene (HCC) 10/2019   left toe  . Neuromuscular disorder (HCC)    diabetic neuropathy    There are no problems to display for this patient.   Past Surgical History:  Procedure Laterality Date  . AMPUTATION TOE Left 11/06/2019   Procedure: AMPUTATION TOE IPJ LEFT HALLUX;  Surgeon: Rosetta Posner, DPM;  Location: [facility] ORS;  Service: Podiatry;  Laterality: Left;  . LUMBAR DISC SURGERY  2010   no metal  . UMBILICAL HERNIA REPAIR  2010   mesh     Prior to Admission medications   Medication Sig Start Date End Date Taking? Authorizing Provider  ciprofloxacin (CIPRO) 500 MG tablet Take 500 mg by mouth 2 (two) times daily.    [provider]  doxycycline (VIBRAMYCIN) 100 MG capsule Take 100 mg by mouth 2 (two) times daily. 10/23/19   [provider]  gabapentin (NEURONTIN) 100 MG capsule Take 100 mg by mouth 2 (two) times daily.  10/23/19   [provider]  glipiZIDE (GLUCOTROL XL) 10 MG 24 hr tablet Take 10 mg by mouth 2 (two) times daily. 10/17/19   [provider]  lactulose (CHRONULAC) 10 GM/15ML solution Take 30 mLs (20 g total) by mouth daily as needed for mild constipation. 11/13/19   Irean Hong, MD  metFORMIN (GLUCOPHAGE) 1000 MG tablet Take 1,000 mg by mouth 2 (two) times daily with a meal.  08/05/15   [provider]  Omega-3 Fatty Acids (OMEGA 3 PO) Take 1,080 mg by mouth in the morning and at bedtime.    [provider]  OZEMPIC, 1 MG/DOSE, 4 MG/3ML SOPN Inject 1 mg into the skin every Sunday.  10/23/19   [provider]  povidone-Iodine (BETADINE) 5 % SOLN topical  solution Apply 1 application topically as needed for wound care.    [provider]    Allergies Patient has no known allergies.  Family History  Problem Relation Age of Onset  . Diabetes Mother   . Diabetes Sister     Social History Social History   Tobacco Use  . Smoking status: Former Smoker    Packs/day: 0.25    Years: 4.00    Pack years: 1.00    Types: Cigarettes    Quit date: 2016    Years since quitting: 5.5  . Smokeless tobacco: Never Used  Vaping Use  . Vaping Use: Never used  Substance Use Topics  . Alcohol use: Yes    Alcohol/week: 20.0 standard drinks    Types: 20 Cans of beer per week    Comment: does not drink  much lately  . Drug use: No    Review of Systems Constitutional: No fever/chills Eyes: No visual changes. ENT: No sore throat. Cardiovascular: Denies chest pain. Respiratory: Denies shortness of breath. Gastrointestinal: Generalized abdominal pain with constipation. Genitourinary: Urinary retention.  Negative for dysuria. Musculoskeletal: Negative for neck pain.  Negative for back pain. Integumentary: Negative for rash. Neurological: Negative for headaches, focal weakness or numbness.   ____________________________________________   PHYSICAL EXAM:  VITAL SIGNS: ED Triage Vitals  Enc Vitals Group     BP 11/15/19 0252 90/60     Pulse Rate 11/15/19 0251 (!) 101     Resp 11/15/19 0251 18     Temp 11/15/19 0251 98.1 F (36.7 C)     Temp src --      SpO2 11/15/19 0252 94 %     Weight 11/15/19 0251 81.6 kg (180 lb)     Height 11/15/19 0251 1.651 m (5\' 5" )     Head Circumference --      Peak Flow --      Pain Score 11/15/19 0251 10     Pain Loc --      Pain Edu? --      Excl. in GC? --     Constitutional: Alert and oriented.  Eyes: Conjunctivae are normal.  Head: Atraumatic. Nose: No congestion/rhinnorhea. Mouth/Throat: Patient is wearing a mask. Neck: No stridor.  No meningeal signs.   Cardiovascular: Normal rate, regular rhythm. Good peripheral circulation. Grossly normal heart sounds. Respiratory: Normal respiratory effort.  No retractions. Gastrointestinal: Soft and possibly mildly distended.  Generalized tenderness to palpation, worse in the lower part of the abdomen (suprapubic region). Musculoskeletal: No lower extremity tenderness nor edema. No gross deformities of extremities. Neurologic:  Normal speech and language. No gross focal neurologic deficits are appreciated.  Skin:  Skin is warm, dry and intact. Psychiatric: Mood and affect are normal. Speech and behavior are normal.  ____________________________________________   LABS (all labs ordered are  listed, but only abnormal results are displayed)  Labs Reviewed  COMPREHENSIVE METABOLIC PANEL - Abnormal; Notable for the following components:      Result Value   Sodium 133 (*)    Chloride 97 (*)    Glucose, Bld 137 (*)    All other components within normal limits  CBC - Abnormal; Notable for the following components:   RBC 4.06 (*)    HCT 37.8 (*)    RDW 11.0 (*)    All other components within normal limits  URINALYSIS, COMPLETE (UACMP) WITH MICROSCOPIC - Abnormal; Notable for the following components:   Color, Urine YELLOW (*)    APPearance CLEAR (*)  Bacteria, UA RARE (*)    All other components within normal limits  LIPASE, BLOOD   ____________________________________________  EKG  No indication for emergent EKG ____________________________________________  RADIOLOGY I, Loleta Rose, personally viewed and evaluated these images (plain radiographs) as part of my medical decision making, as well as reviewing the written report by the radiologist.  ED MD interpretation: CT abdomen pelvis is pending at the time of transfer of care to Dr. Fanny Bien  Official radiology report(s):   ____________________________________________   PROCEDURES   Procedure(s) performed (including Critical Care):  Procedures   ____________________________________________   INITIAL IMPRESSION / MDM / ASSESSMENT AND PLAN / ED COURSE  As part of my medical decision making, I reviewed the following data within the electronic MEDICAL RECORD NUMBER Nursing notes reviewed and incorporated, Labs reviewed , Old chart reviewed, Patient signed out to Dr. Fanny Bien and Notes from prior ED visits   Differential diagnosis includes, but is not limited to, constipation/obstipation, SBO/ileus, UTI/pyelonephritis, ureteral/renal colic.  The patient was evaluated several days ago and claims that he is taking multiple laxatives to help him have a bowel movement but that he has not done so.  Given his persistent  worsening symptoms in spite of treatment, I will further evaluate with a CT abdomen/pelvis with oral and IV contrast.  I am giving him a dose of Zofran 4 mg IV to help him tolerate drinking the oral contrast.  The patient's comprehensive metabolic panel and CBC are essentially normal.  Normal lipase.  Urinalysis is pending.  I am transferring ED care to Dr. Rosann Auerbach to follow-up on the urinalysis and CT scan and determine the appropriate treatment and disposition.  If there are nonemergent findings on the CT scan he should be appropriate for discharge and outpatient follow-up.           ____________________________________________  FINAL CLINICAL IMPRESSION(S) / ED DIAGNOSES  Final diagnoses:  Constipation, unspecified constipation type     MEDICATIONS GIVEN DURING THIS VISIT:  Medications  iohexol (OMNIPAQUE) 9 MG/ML oral solution 500 mL (500 mLs Oral Contrast Given 11/15/19 0640)  ondansetron (ZOFRAN) injection 4 mg (4 mg Intravenous Given 11/15/19 0641)  iohexol (OMNIPAQUE) 300 MG/ML solution 100 mL (100 mLs Intravenous Contrast Given 11/15/19 0656)     ED Discharge Orders    None      *Please note:  Brendan Pruitt was evaluated in Emergency Department on 11/15/2019 for the symptoms described in the history of present illness. He was evaluated in the context of the global COVID-19 pandemic, which necessitated consideration that the patient might be at risk for infection with the SARS-CoV-2 virus that causes COVID-19. Institutional protocols and algorithms that pertain to the evaluation of patients at risk for COVID-19 are in a state of rapid change based on information released by regulatory bodies including the CDC and federal and state organizations. These policies and algorithms were followed during the patient's care in the ED.  Some ED evaluations and interventions may be delayed as a result of limited staffing during and after the pandemic.*  Note:  This document was prepared  using Dragon voice recognition software and may include unintentional dictation errors.   Loleta Rose, MD 11/15/19 [phone]

## 2019-11-15 NOTE — ED Notes (Signed)
Bladder scan reveals >456 mL

## 2019-11-15 NOTE — ED Notes (Addendum)
Pt reports minimal stool from enema. Dr Fanny Bien notified.

## 2019-11-15 NOTE — ED Notes (Signed)
Pt on toilet, informed to pull red cord if needs assistance

## 2019-11-15 NOTE — Discharge Instructions (Signed)
? ?  Please return to the emergency room right away if you are to develop a fever, severe nausea, your pain becomes severe or worsens, you are unable to keep food down, begin vomiting any dark or bloody fluid, you develop any dark or bloody stools, feel dehydrated, or other new concerns or symptoms arise. ? ?

## 2019-11-15 NOTE — ED Triage Notes (Signed)
Patient with complaint of lower abdominal pain and constipation. Patient states that he was seen last Friday for the same. Patient states that his last BM was Tuesday.

## 2019-11-15 NOTE — ED Provider Notes (Signed)
CT ABDOMEN PELVIS W CONTRAST  Result Date: 11/15/2019 CLINICAL DATA:  Nausea, vomiting and abdomen pain. EXAM: CT ABDOMEN AND PELVIS WITH CONTRAST TECHNIQUE: Multidetector CT imaging of the abdomen and pelvis was performed using the standard protocol following bolus administration of intravenous contrast. CONTRAST:  OMNIPAQUE IOHEXOL 300 MG/ML  SOLN COMPARISON:  March 04, 2013 FINDINGS: Lower chest: No acute abnormality. Hepatobiliary: No focal liver abnormality is seen. No gallstones, gallbladder wall thickening, or biliary dilatation. Pancreas: Unremarkable. No pancreatic ductal dilatation or surrounding inflammatory changes. Spleen: Normal in size without focal abnormality. Adrenals/Urinary Tract: Adrenal glands are unremarkable. Kidneys are normal, without renal calculi, focal lesion, or hydronephrosis. Bladder is unremarkable. Stomach/Bowel: Minimal hiatal hernia is identified. Stomach is otherwise normal. There is no small bowel obstruction. The appendix is normal. Inflammation is noted surrounding the distal sigmoid colon and rectum. Extensive bowel content is identified throughout colon. Vascular/Lymphatic: Aortic atherosclerosis. No enlarged abdominal or pelvic lymph nodes. Reproductive: Prostate is unremarkable. Other: None. Musculoskeletal: Degenerative joint changes of the spine are noted. IMPRESSION: 1. Inflammation is noted surrounding the distal sigmoid colon and rectum. This is nonspecific but can be seen in colitis/proctitis. 2. Extensive bowel content is identified throughout colon. This can be seen in constipation. 3. Aortic atherosclerosis. Aortic Atherosclerosis (ICD10-I70.0). Electronically Signed   By: Sherian Rein M.D.   On: 11/15/2019 07:23     Imaging reviewed.  Appears most consistent with likely significant constipation.  In the patient's clinical setting, prescription medication use, etc. I suspect severe opioid induced constipation.  Patient given enema.  After 30  minutes, he does report that he defecated a few times with good effect and he feels much better.  He has been able to urinate and feels he is able to evacuate his urine and bladder now.  Interviewed with [place] interpreter, Welty, but patient really did not seem to require specific interpretation and all his questions were answered.  He will trial Amitiza and a single GoLYTELY prep.  Careful return precautions discussed.  Follow-up with primary.  Return precautions and treatment recommendations and follow-up discussed with the patient who is agreeable with the plan.  His pain is well controlled, he feels improved, able to urinate well now, and reports feeling much better.   Sharyn Creamer, MD 11/15/19 [phone]

## 2020-03-11 DIAGNOSIS — H538 Other visual disturbances: Secondary | ICD-10-CM | POA: Insufficient documentation

## 2020-03-11 DIAGNOSIS — Z8616 Personal history of COVID-19: Secondary | ICD-10-CM | POA: Insufficient documentation

## 2020-05-31 DIAGNOSIS — Z89412 Acquired absence of left great toe: Secondary | ICD-10-CM | POA: Insufficient documentation

## 2020-09-09 ENCOUNTER — Other Ambulatory Visit (INDEPENDENT_AMBULATORY_CARE_PROVIDER_SITE_OTHER): Payer: Self-pay | Admitting: Vascular Surgery

## 2020-09-09 DIAGNOSIS — L97909 Non-pressure chronic ulcer of unspecified part of unspecified lower leg with unspecified severity: Secondary | ICD-10-CM

## 2020-09-09 DIAGNOSIS — I739 Peripheral vascular disease, unspecified: Secondary | ICD-10-CM

## 2020-09-12 ENCOUNTER — Encounter (INDEPENDENT_AMBULATORY_CARE_PROVIDER_SITE_OTHER): Payer: Self-pay | Admitting: Vascular Surgery

## 2020-09-12 ENCOUNTER — Other Ambulatory Visit: Payer: Self-pay

## 2020-09-12 ENCOUNTER — Ambulatory Visit (INDEPENDENT_AMBULATORY_CARE_PROVIDER_SITE_OTHER): Payer: Self-pay | Admitting: Vascular Surgery

## 2020-09-12 ENCOUNTER — Ambulatory Visit (INDEPENDENT_AMBULATORY_CARE_PROVIDER_SITE_OTHER): Payer: Commercial Managed Care - PPO

## 2020-09-12 VITALS — BP 144/94 | HR 92 | Resp 16 | Ht 65.5 in | Wt 210.0 lb

## 2020-09-12 DIAGNOSIS — L97919 Non-pressure chronic ulcer of unspecified part of right lower leg with unspecified severity: Secondary | ICD-10-CM

## 2020-09-12 DIAGNOSIS — L97929 Non-pressure chronic ulcer of unspecified part of left lower leg with unspecified severity: Secondary | ICD-10-CM

## 2020-09-12 DIAGNOSIS — L97909 Non-pressure chronic ulcer of unspecified part of unspecified lower leg with unspecified severity: Secondary | ICD-10-CM | POA: Diagnosis not present

## 2020-09-12 DIAGNOSIS — I739 Peripheral vascular disease, unspecified: Secondary | ICD-10-CM | POA: Diagnosis not present

## 2020-09-12 DIAGNOSIS — E782 Mixed hyperlipidemia: Secondary | ICD-10-CM

## 2020-09-12 DIAGNOSIS — E1142 Type 2 diabetes mellitus with diabetic polyneuropathy: Secondary | ICD-10-CM

## 2020-09-12 DIAGNOSIS — E1165 Type 2 diabetes mellitus with hyperglycemia: Secondary | ICD-10-CM

## 2020-09-12 DIAGNOSIS — E119 Type 2 diabetes mellitus without complications: Secondary | ICD-10-CM | POA: Insufficient documentation

## 2020-09-12 NOTE — Progress Notes (Signed)
MRN : [id number]  Brendan Pruitt is a 45 y.o. ([birth year]) male who presents with chief complaint of  Chief Complaint  Patient presents with  . New Patient (Initial Visit)    Ref Baker PVD/foot ulcer  .  History of Present Illness:   The patient is seen for evaluation of open wounds of both lower extremities and diminished pulses.  He states that the wounds occurred after using a massaging machine.  They have been present for over a month.  He notes that he really cannot feel his legs from the knees down and states he does not understand why.  He is familiar with the term diabetic neuropathy.  The patient denies rest pain or dangling of an extremity off the side of the bed during the night for relief. No prior interventions or surgeries.  No history of back problems or DJD of the lumbar sacral spine.   The patient denies amaurosis fugax or recent TIA symptoms. There are no recent neurological changes noted. The patient denies history of DVT, PE or superficial thrombophlebitis. The patient denies recent episodes of angina or shortness of breath.  ABIs are obtained in the office today demonstrate normal Doppler signals in both the posterior tibial and dorsalis pedis distributions bilaterally.  Toe tracings are strongly pulsatile with a TBI on the right of 0.83 and a TBI on the left of 1.25 both well within the normal range.  Current Meds  Medication Sig  . doxycycline (VIBRAMYCIN) 100 MG capsule Take 100 mg by mouth 2 (two) times daily.  Marland Kitchen gabapentin (NEURONTIN) 100 MG capsule Take 100 mg by mouth 2 (two) times daily.   Marland Kitchen glipiZIDE (GLUCOTROL XL) 10 MG 24 hr tablet Take 10 mg by mouth 2 (two) times daily.  Marland Kitchen lubiprostone (AMITIZA) 24 MCG capsule Take 1 capsule (24 mcg total) by mouth 2 (two) times daily with a meal.  . metFORMIN (GLUCOPHAGE) 1000 MG tablet Take 1,000 mg by mouth 2 (two) times daily with a meal.   . Omega-3 Fatty Acids (OMEGA 3 PO) Take 1,080 mg by mouth in the  morning and at bedtime.  Marland Kitchen OZEMPIC, 1 MG/DOSE, 4 MG/3ML SOPN Inject 1 mg into the skin every Sunday.   . povidone-Iodine (BETADINE) 5 % SOLN topical solution Apply 1 application topically as needed for wound care.    Past Medical History:  Diagnosis Date  . Acute osteomyelitis (HCC) 10/2019   phalanx left foot  . Cellulitis and abscess of foot 10/2019   left.  using betadine wet to dry dressings.  treated with multiple antibiotics. + pseudomonas  . Diabetes mellitus without complication (HCC)   . Elevated hemoglobin A1c 10/2019   results are 12.5%  . Gangrene (HCC) 10/2019   left toe  . Neuromuscular disorder (HCC)    diabetic neuropathy    Past Surgical History:  Procedure Laterality Date  . AMPUTATION TOE Left 11/06/2019   Procedure: AMPUTATION TOE IPJ LEFT HALLUX;  Surgeon: Rosetta Posner, DPM;  Location: [facility] ORS;  Service: Podiatry;  Laterality: Left;  . LUMBAR DISC SURGERY  2010   no metal  . UMBILICAL HERNIA REPAIR  2010   mesh     Social History Social History   Tobacco Use  . Smoking status: Former Smoker    Packs/day: 0.25    Years: 4.00    Pack years: 1.00    Types: Cigarettes    Quit date: 2016    Years since quitting: 6.3  . Smokeless tobacco: Never  Used  Vaping Use  . Vaping Use: Never used  Substance Use Topics  . Alcohol use: Yes    Alcohol/week: 20.0 standard drinks    Types: 20 Cans of beer per week    Comment: does not drink much lately  . Drug use: No    Family History Family History  Problem Relation Age of Onset  . Diabetes Mother   . Diabetes Sister   No family history of bleeding/clotting disorders, porphyria or autoimmune disease   No Known Allergies   REVIEW OF SYSTEMS (Negative unless checked)  Constitutional: [] Weight loss  [] Fever  [] Chills Cardiac: [] Chest pain   [] Chest pressure   [] Palpitations   [] Shortness of breath when laying flat   [] Shortness of breath with exertion. Vascular:  [] Pain in legs with walking   [] Pain  in legs at rest  [] History of DVT   [] Phlebitis   [x] Swelling in legs   [] Varicose veins   [x] Non-healing ulcers Pulmonary:   [] Uses home oxygen   [] Productive cough   [] Hemoptysis   [] Wheeze  [] COPD   [] Asthma Neurologic:  [] Dizziness   [] Seizures   [] History of stroke   [] History of TIA  [] Aphasia   [] Vissual changes   [] Weakness or numbness in arm   [] Weakness or numbness in leg Musculoskeletal:   [] Joint swelling   [] Joint pain   [] Low back pain Hematologic:  [] Easy bruising  [] Easy bleeding   [] Hypercoagulable state   [] Anemic Gastrointestinal:  [] Diarrhea   [] Vomiting  [] Gastroesophageal reflux/heartburn   [] Difficulty swallowing. Genitourinary:  [] Chronic kidney disease   [] Difficult urination  [] Frequent urination   [] Blood in urine Skin:  [x] Rashes   [x] Ulcers  Psychological:  [] History of anxiety   []  History of major depression.  Physical Examination  Vitals:   09/12/20 0832  BP: (!) 144/94  Pulse: 92  Resp: 16  Weight: 210 lb (95.3 kg)  Height: 5' 5.5" (1.664 m)   Body mass index is 34.41 kg/m. Gen: WD/WN, NAD Head: [place]/AT, No temporalis wasting.  Ear/Nose/Throat: Hearing grossly intact, nares w/o erythema or drainage, poor dentition Eyes: PER, EOMI, sclera nonicteric.  Neck: Supple, no masses.  No bruit or JVD.  Pulmonary:  Good air movement, clear to auscultation bilaterally, no use of accessory muscles.  Cardiac: RRR, normal S1, S2, no Murmurs. Vascular: Multiple wounds of the feet which are bandaged.  On his shin and ankle area he has multiple healed wounds with several that are open but dry.  No evidence of infection.  There is 1+ edema. Vessel Right Left  Radial Palpable Palpable  Gastrointestinal: soft, non-distended. No guarding/no peritoneal signs.  Musculoskeletal: M/S 5/5 throughout.  No deformity or atrophy.  Neurologic: CN 2-12 intact. Pain and light touch intact in extremities.  Symmetrical.  Speech is fluent. Motor exam as listed above. Psychiatric:  Judgment intact, Mood & affect appropriate for pt's clinical situation. Dermatologic: Moderate venous rashes with ulcers noted.  No changes consistent with cellulitis. Lymph : Moderate lichenification and skin changes of chronic lymphedema.  CBC Lab Results  Component Value Date   WBC 10.2 11/15/2019   HGB 13.4 11/15/2019   HCT 37.8 (L) 11/15/2019   MCV 93.1 11/15/2019   PLT 197 11/15/2019    BMET    Component Value Date/Time   NA 133 (L) 11/15/2019 0254   K 3.6 11/15/2019 0254   CL 97 (L) 11/15/2019 0254   CO2 25 11/15/2019 0254   GLUCOSE 137 (H) 11/15/2019 0254   BUN 13 11/15/2019 0254  CREATININE 0.94 11/15/2019 0254   CALCIUM 9.1 11/15/2019 0254   GFRNONAA >60 11/15/2019 0254   GFRAA >60 11/15/2019 0254   CrCl cannot be calculated (Patient's most recent lab result is older than the maximum 21 days allowed.).  COAG No results found for: INR, PROTIME  Radiology No results found.   Assessment/Plan 1. Ulcers of both lower legs (HCC) Recommend:  I do not find evidence of life style limiting vascular disease. The patient specifically denies life style limitation.  Based on his noninvasive studies he has adequate arterial perfusion for wound healing.  ABI's of the legs do not identify significant vascular problems.  The patient should continue walking and begin a more formal exercise program. The patient should continue his antiplatelet therapy and aggressive treatment of the lipid abnormalities.  The patient should begin wearing graduated compression socks 10-15 mmHg strength to control his edema and prevent further blisters and wounds.  I specifically gave him information regarding Jobst sensitive foot socks.  Patient will follow-up with me on a PRN basis   2. Diabetic polyneuropathy associated with type 2 diabetes mellitus (HCC) I think this is a significant problem given his mechanism of injury.  I did discuss with him diabetic neuropathy and why it causes  problems to hopefully give him a better understanding.  Further treatment is deferred to his endocrinologist and or primary care provider.  3. Type 2 diabetes mellitus with hyperglycemia, without long-term current use of insulin (HCC) Continue hypoglycemic medications as already ordered, these medications have been reviewed and there are no changes at this time.  Hgb A1C to be monitored as already arranged by primary service   4. Hyperlipemia, mixed Continue statin as ordered and reviewed, no changes at this time     Levora Dredge, MD  09/12/2020 8:44 AM

## 2020-09-14 ENCOUNTER — Other Ambulatory Visit: Payer: Self-pay

## 2020-09-14 ENCOUNTER — Encounter: Payer: Self-pay | Admitting: Ophthalmology

## 2020-09-19 NOTE — Discharge Instructions (Signed)

## 2020-09-21 ENCOUNTER — Encounter: Payer: Self-pay | Admitting: Ophthalmology

## 2020-09-21 ENCOUNTER — Encounter
Admission: RE | Disposition: A | Discharge status: Home / Self Care | Payer: Self-pay | Source: Home / Self Care | Attending: Ophthalmology

## 2020-09-21 ENCOUNTER — Other Ambulatory Visit: Payer: Self-pay

## 2020-09-21 ENCOUNTER — Ambulatory Visit: Payer: Commercial Managed Care - PPO | Admitting: Anesthesiology

## 2020-09-21 ENCOUNTER — Ambulatory Visit
Admission: RE | Admit: 2020-09-21 | Discharge: 2020-09-21 | Disposition: A | Discharge status: Home / Self Care | Payer: Commercial Managed Care - PPO | Attending: Ophthalmology | Admitting: Ophthalmology

## 2020-09-21 DIAGNOSIS — Z79899 Other long term (current) drug therapy: Secondary | ICD-10-CM | POA: Insufficient documentation

## 2020-09-21 DIAGNOSIS — Z87891 Personal history of nicotine dependence: Secondary | ICD-10-CM | POA: Diagnosis not present

## 2020-09-21 DIAGNOSIS — Z833 Family history of diabetes mellitus: Secondary | ICD-10-CM | POA: Insufficient documentation

## 2020-09-21 DIAGNOSIS — H2511 Age-related nuclear cataract, right eye: Secondary | ICD-10-CM | POA: Insufficient documentation

## 2020-09-21 DIAGNOSIS — E1136 Type 2 diabetes mellitus with diabetic cataract: Secondary | ICD-10-CM | POA: Insufficient documentation

## 2020-09-21 DIAGNOSIS — Z7984 Long term (current) use of oral hypoglycemic drugs: Secondary | ICD-10-CM | POA: Insufficient documentation

## 2020-09-21 HISTORY — PX: CATARACT EXTRACTION W/PHACO: SHX586

## 2020-09-21 HISTORY — DX: Essential (primary) hypertension: I10

## 2020-09-21 LAB — GLUCOSE, CAPILLARY
Glucose-Capillary: 203 mg/dL — ABNORMAL HIGH (ref 70–99)
Glucose-Capillary: 202 mg/dL — ABNORMAL HIGH (ref 70–99)

## 2020-09-21 SURGERY — PHACOEMULSIFICATION, CATARACT, WITH IOL INSERTION
Anesthesia: Monitor Anesthesia Care | Site: Eye | Laterality: Right

## 2020-09-21 MED ORDER — CEFUROXIME OPHTHALMIC INJECTION 1 MG/0.1 ML
INJECTION | OPHTHALMIC | Status: DC | PRN
  Administered 2020-09-21: 0.1 mL via INTRACAMERAL

## 2020-09-21 MED ORDER — MIDAZOLAM HCL 2 MG/2ML IJ SOLN
INTRAMUSCULAR | Status: DC | PRN
  Administered 2020-09-21: 2 mg via INTRAVENOUS

## 2020-09-21 MED ORDER — NA HYALUR & NA CHOND-NA HYALUR 0.4-0.35 ML IO KIT
PACK | INTRAOCULAR | Status: DC | PRN
  Administered 2020-09-21: 1 mL via INTRAOCULAR

## 2020-09-21 MED ORDER — BRIMONIDINE TARTRATE-TIMOLOL 0.2-0.5 % OP SOLN
OPHTHALMIC | Status: DC | PRN
  Administered 2020-09-21: 1 [drp] via OPHTHALMIC

## 2020-09-21 MED ORDER — SODIUM HYALURONATE 10 MG/ML IO SOLUTION
PREFILLED_SYRINGE | INTRAOCULAR | Status: DC | PRN
  Administered 2020-09-21: 0.55 mL via INTRAOCULAR

## 2020-09-21 MED ORDER — EPINEPHRINE PF 1 MG/ML IJ SOLN
INTRAOCULAR | Status: DC | PRN
  Administered 2020-09-21: 118 mL via OPHTHALMIC

## 2020-09-21 MED ORDER — FENTANYL CITRATE (PF) 100 MCG/2ML IJ SOLN
INTRAMUSCULAR | Status: DC | PRN
  Administered 2020-09-21 (×2): 50 ug via INTRAVENOUS

## 2020-09-21 MED ORDER — LACTATED RINGERS IV SOLN: INTRAVENOUS | Status: DC

## 2020-09-21 MED ORDER — ARMC OPHTHALMIC DILATING DROPS
1.0000 "application " | OPHTHALMIC | Status: DC | PRN
  Administered 2020-09-21 (×3): 1 via OPHTHALMIC

## 2020-09-21 MED ORDER — LIDOCAINE HCL (PF) 2 % IJ SOLN
INTRAOCULAR | Status: DC | PRN
  Administered 2020-09-21: 1 mL via INTRAMUSCULAR

## 2020-09-21 MED ORDER — TETRACAINE HCL 0.5 % OP SOLN
1.0000 [drp] | OPHTHALMIC | Status: DC | PRN
  Administered 2020-09-21 (×3): 1 [drp] via OPHTHALMIC

## 2020-09-21 SURGICAL SUPPLY — 24 items
CANNULA ANT/CHMB 27GA (MISCELLANEOUS) ×2 IMPLANT
GLOVE SURG ENC TEXT LTX SZ7.5 (GLOVE) ×2 IMPLANT
GLOVE SURG TRIUMPH 8.0 PF LTX (GLOVE) ×2 IMPLANT
GOWN STRL REUS W/ TWL LRG LVL3 (GOWN DISPOSABLE) ×2 IMPLANT
GOWN STRL REUS W/TWL LRG LVL3 (GOWN DISPOSABLE) ×4
LENS IOL TECNIS EYHANCE 22.5 (Intraocular Lens) ×2 IMPLANT
MARKER SKIN DUAL TIP RULER LAB (MISCELLANEOUS) ×2 IMPLANT
NDL RETROBULBAR .5 NSTRL (NEEDLE) IMPLANT
NEEDLE CAPSULORHEX 25GA (NEEDLE) ×2 IMPLANT
NEEDLE FILTER BLUNT 18X 1/2SAF (NEEDLE) ×2
NEEDLE FILTER BLUNT 18X1 1/2 (NEEDLE) ×2 IMPLANT
PACK CATARACT BRASINGTON (MISCELLANEOUS) ×2 IMPLANT
PACK EYE AFTER SURG (MISCELLANEOUS) ×2 IMPLANT
PACK OPTHALMIC (MISCELLANEOUS) ×2 IMPLANT
RING MALYGIN 7.0 (MISCELLANEOUS) IMPLANT
SOLUTION OPHTHALMIC SALT (MISCELLANEOUS) ×2 IMPLANT
SUT ETHILON 10-0 CS-B-6CS-B-6 (SUTURE)
SUT VICRYL  9 0 (SUTURE)
SUT VICRYL 9 0 (SUTURE) IMPLANT
SUTURE EHLN 10-0 CS-B-6CS-B-6 (SUTURE) IMPLANT
SYR 3ML LL SCALE MARK (SYRINGE) ×4 IMPLANT
SYR TB 1ML LUER SLIP (SYRINGE) ×2 IMPLANT
WATER STERILE IRR 250ML POUR (IV SOLUTION) ×2 IMPLANT
WIPE NON LINTING 3.25X3.25 (MISCELLANEOUS) ×2 IMPLANT

## 2020-09-21 NOTE — Op Note (Signed)
  LOCATION:  [facility]   PREOPERATIVE DIAGNOSIS:    Nuclear sclerotic cataract right eye. H25.11   POSTOPERATIVE DIAGNOSIS:  Nuclear sclerotic cataract right eye.     PROCEDURE:  Phacoemusification with posterior chamber intraocular lens placement of the right eye   ULTRASOUND TIME: Procedure(s) with comments: CATARACT EXTRACTION PHACO AND INTRAOCULAR LENS PLACEMENT (IOC) RIGHT DIABETIC (Right) - Diabetic - oral meds cde 9.48 01:48.8 minutes 8.7%  LENS:   Implant Name Type Inv. Item Serial No. Manufacturer Lot No. LRB No. Used Action  LENS IOL TECNIS EYHANCE 22.5 - [id number] Intraocular Lens LENS IOL TECNIS EYHANCE 22.5 [id number] JOHNSON   Right 1 Implanted         SURGEON:  Deirdre Evener, MD   ANESTHESIA:  Topical with tetracaine drops and 2% Xylocaine jelly, augmented with 1% preservative-free intracameral lidocaine.    COMPLICATIONS:  None.   DESCRIPTION OF PROCEDURE:  The patient was identified in the holding room and transported to the operating room and placed in the supine position under the operating microscope.  The right eye was identified as the operative eye and it was prepped and draped in the usual sterile ophthalmic fashion.   A 1 millimeter clear-corneal paracentesis was made at the 12:00 position.  0.5 ml of preservative-free 1% lidocaine was injected into the anterior chamber. The anterior chamber was filled with Viscoat viscoelastic.  A 2.4 millimeter keratome was used to make a near-clear corneal incision at the 9:00 position.  A curvilinear capsulorrhexis was made with a cystotome and capsulorrhexis forceps.  Balanced salt solution was used to hydrodissect and hydrodelineate the nucleus.   Phacoemulsification was then used in stop and chop fashion to remove the lens nucleus and epinucleus.  The remaining cortex was then removed using the irrigation and aspiration handpiece. Provisc was then placed into the capsular bag to distend it for  lens placement.  A lens was then injected into the capsular bag.  The remaining viscoelastic was aspirated.   Wounds were hydrated with balanced salt solution.  The anterior chamber was inflated to a physiologic pressure with balanced salt solution.  No wound leaks were noted. Cefuroxime 0.1 ml of a 10mg /ml solution was injected into the anterior chamber for a dose of 1 mg of intracameral antibiotic at the completion of the case.   Timolol and Brimonidine drops were applied to the eye.  The patient was taken to the recovery room in stable condition without complications of anesthesia or surgery.   Lejla Moeser 09/21/2020, 11:30 AM

## 2020-09-21 NOTE — H&P (Signed)
[name]   Primary Care Physician:  Barbette Reichmann, MD Ophthalmologist: Dr. Lockie Mola  Pre-Procedure History & Physical: HPI:  Brendan Pruitt is a 45 y.o. male here for ophthalmic surgery.   Past Medical History:  Diagnosis Date  . Acute osteomyelitis (HCC) 10/2019   phalanx left foot  . Cellulitis and abscess of foot 10/2019   left.  using betadine wet to dry dressings.  treated with multiple antibiotics. + pseudomonas  . Diabetes mellitus without complication (HCC)   . Elevated hemoglobin A1c 10/2019   results are 12.5%  . Gangrene (HCC) 10/2019   left toe  . Hypertension   . Neuromuscular disorder (HCC)    diabetic neuropathy    Past Surgical History:  Procedure Laterality Date  . AMPUTATION TOE Left 11/06/2019   Procedure: AMPUTATION TOE IPJ LEFT HALLUX;  Surgeon: Rosetta Posner, DPM;  Location: [facility] ORS;  Service: Podiatry;  Laterality: Left;  . LUMBAR DISC SURGERY  2010   no metal  . UMBILICAL HERNIA REPAIR  2010   mesh     Prior to Admission medications   Medication Sig Start Date End Date Taking? Authorizing Provider  dapagliflozin propanediol (FARXIGA) 10 MG TABS tablet Take 10 mg by mouth daily.   Yes [provider]  gabapentin (NEURONTIN) 100 MG capsule Take 300 mg by mouth 2 (two) times daily. 10/23/19  Yes [provider]  glipiZIDE (GLUCOTROL XL) 10 MG 24 hr tablet Take 10 mg by mouth 2 (two) times daily. 10/17/19  Yes [provider]  losartan (COZAAR) 25 MG tablet Take 25 mg by mouth daily.   Yes [provider]  lubiprostone (AMITIZA) 24 MCG capsule Take 1 capsule (24 mcg total) by mouth 2 (two) times daily with a meal. 11/15/19  Yes Sharyn Creamer, MD  metFORMIN (GLUCOPHAGE) 1000 MG tablet Take 1,000 mg by mouth 2 (two) times daily with a meal.  08/05/15  Yes [provider]  Multiple Vitamin (ONE-A-DAY MENS PO) Take by mouth daily.   Yes [provider]  Omega-3 Fatty Acids (OMEGA 3  PO) Take 1,080 mg by mouth in the morning and at bedtime.   Yes [provider]  OZEMPIC, 1 MG/DOSE, 4 MG/3ML SOPN Inject 1 mg into the skin every Sunday.  10/23/19  Yes [provider]  povidone-Iodine (BETADINE) 5 % SOLN topical solution Apply 1 application topically as needed for wound care.   Yes [provider]  lactulose (CHRONULAC) 10 GM/15ML solution Take 30 mLs (20 g total) by mouth daily as needed for mild constipation. Patient not taking: No sig reported 11/13/19   Irean Hong, MD    Allergies as of 08/02/2020  . (No Known Allergies)    Family History  Problem Relation Age of Onset  . Diabetes Mother   . Diabetes Sister     Social History   Socioeconomic History  . Marital status: Married    Spouse name: Publishing rights manager  . Number of children: Not on file  . Years of education: Not on file  . Highest education level: Not on file  Occupational History  . Not on file  Tobacco Use  . Smoking status: Former Smoker    Packs/day: 0.25    Years: 4.00    Pack years: 1.00    Types: Cigarettes    Quit date: 2016    Years since quitting: 6.3  . Smokeless tobacco: Never Used  Vaping Use  . Vaping Use: Never used  Substance and  Sexual Activity  . Alcohol use: Yes    Alcohol/week: 20.0 standard drinks    Types: 20 Cans of beer per week    Comment: does not drink much lately  . Drug use: No  . Sexual activity: Yes  Other Topics Concern  . Not on file  Social History Narrative   Patient lives with his wife and 5 children.   Social Determinants of Health   Financial Resource Strain: Not on file  Food Insecurity: Not on file  Transportation Needs: Not on file  Physical Activity: Not on file  Stress: Not on file  Social Connections: Not on file  Intimate Partner Violence: Not on file    Review of Systems: See HPI, otherwise negative ROS  Physical Exam: BP (!) 132/102   Pulse 88   Temp 97.8 F (36.6 C) (Temporal)   Ht 5\' 5"  (1.651 m)   Wt  93.4 kg   SpO2 99%   BMI 34.28 kg/m  General:   Alert,  pleasant and cooperative in NAD Head:  Normocephalic and atraumatic. Lungs:  Clear to auscultation.    Heart:  Regular rate and rhythm.   Impression/Plan: Brendan Pruitt is here for ophthalmic surgery.  Risks, benefits, limitations, and alternatives regarding ophthalmic surgery have been reviewed with the patient.  Questions have been answered.  All parties agreeable.   [name], MD  09/21/2020, 9:55 AM

## 2020-09-21 NOTE — Transfer of Care (Signed)
Immediate Anesthesia Transfer of Care Note  Patient: Brendan Pruitt  Procedure(s) Performed: CATARACT EXTRACTION PHACO AND INTRAOCULAR LENS PLACEMENT (IOC) RIGHT DIABETIC (Right Eye)  Patient Location: PACU  Anesthesia Type: MAC  Level of Consciousness: awake, alert  and patient cooperative  Airway and Oxygen Therapy: Patient Spontanous Breathing and Patient connected to supplemental oxygen  Post-op Assessment: Post-op Vital signs reviewed, Patient's Cardiovascular Status Stable, Respiratory Function Stable, Patent Airway and No signs of Nausea or vomiting  Post-op Vital Signs: Reviewed and stable  Complications: No complications documented.

## 2020-09-21 NOTE — Anesthesia Procedure Notes (Signed)
Procedure Name: MAC Performed by: Rylin Seavey, CRNA Pre-anesthesia Checklist: Patient identified, Emergency Drugs available, Suction available, Timeout performed and Patient being monitored Patient Re-evaluated:Patient Re-evaluated prior to induction Oxygen Delivery Method: Nasal cannula Placement Confirmation: positive ETCO2       

## 2020-09-21 NOTE — Anesthesia Postprocedure Evaluation (Signed)
Anesthesia Post Note  Patient: Brendan Pruitt  Procedure(s) Performed: CATARACT EXTRACTION PHACO AND INTRAOCULAR LENS PLACEMENT (IOC) RIGHT DIABETIC (Right Eye)     Patient location during evaluation: PACU Anesthesia Type: MAC Level of consciousness: awake and alert Pain management: pain level controlled Vital Signs Assessment: post-procedure vital signs reviewed and stable Respiratory status: spontaneous breathing, nonlabored ventilation, respiratory function stable and patient connected to nasal cannula oxygen Cardiovascular status: stable and blood pressure returned to baseline Postop Assessment: no apparent nausea or vomiting Anesthetic complications: no   No complications documented.  Fidel Levy

## 2020-09-21 NOTE — Anesthesia Preprocedure Evaluation (Addendum)
Anesthesia Evaluation  Patient identified by MRN, date of birth, ID band Patient awake    Reviewed: Allergy & Precautions, NPO status , Patient's Chart, lab work & pertinent test results  History of Anesthesia Complications Negative for: history of anesthetic complications  Airway Mallampati: II  TM Distance: >3 FB Neck ROM: full    Dental no notable dental hx.    Pulmonary neg pulmonary ROS, former smoker,    Pulmonary exam normal        Cardiovascular Exercise Tolerance: Good hypertension, Normal cardiovascular exam     Neuro/Psych Neuropathy leg negative neurological ROS  negative psych ROS   GI/Hepatic negative GI ROS, Neg liver ROS,   Endo/Other  diabetesMorbid obesity (bmi 34)diabetic neuropathy  Renal/GU negative Renal ROS  negative genitourinary   Musculoskeletal negative musculoskeletal ROS (+) Hx toe amputation   Abdominal   Peds negative pediatric ROS (+)  Hematology negative hematology ROS (+)   Anesthesia Other Findings pcp: Hande, Rober Minion, MD at 09/05/2020;  bilateral feet : open wounds of both lower extremities.   He states that the wounds occurred after using a massaging machine.    Reproductive/Obstetrics negative OB ROS                            Anesthesia Physical Anesthesia Plan  ASA: II  Anesthesia Plan: MAC   Post-op Pain Management:    Induction:   PONV Risk Score and Plan: 1  Airway Management Planned:   Additional Equipment:   Intra-op Plan:   Post-operative Plan:   Informed Consent: I have reviewed the patients History and Physical, chart, labs and discussed the procedure including the risks, benefits and alternatives for the proposed anesthesia with the patient or authorized representative who has indicated his/her understanding and acceptance.       Plan Discussed with: CRNA  Anesthesia Plan Comments:          Anesthesia Quick Evaluation

## 2020-09-22 ENCOUNTER — Encounter: Payer: Self-pay | Admitting: Ophthalmology

## 2020-09-28 ENCOUNTER — Encounter: Payer: Self-pay | Admitting: Ophthalmology

## 2020-10-07 NOTE — Discharge Instructions (Signed)

## 2020-10-12 ENCOUNTER — Encounter
Admission: RE | Disposition: A | Discharge status: Home / Self Care | Payer: Self-pay | Source: Home / Self Care | Attending: Ophthalmology

## 2020-10-12 ENCOUNTER — Ambulatory Visit: Payer: Commercial Managed Care - PPO | Admitting: Anesthesiology

## 2020-10-12 ENCOUNTER — Other Ambulatory Visit: Payer: Self-pay

## 2020-10-12 ENCOUNTER — Ambulatory Visit
Admission: RE | Admit: 2020-10-12 | Discharge: 2020-10-12 | Disposition: A | Discharge status: Home / Self Care | Payer: Commercial Managed Care - PPO | Attending: Ophthalmology | Admitting: Ophthalmology

## 2020-10-12 DIAGNOSIS — Z87891 Personal history of nicotine dependence: Secondary | ICD-10-CM | POA: Insufficient documentation

## 2020-10-12 DIAGNOSIS — Z961 Presence of intraocular lens: Secondary | ICD-10-CM | POA: Diagnosis not present

## 2020-10-12 DIAGNOSIS — Z79899 Other long term (current) drug therapy: Secondary | ICD-10-CM | POA: Insufficient documentation

## 2020-10-12 DIAGNOSIS — H2512 Age-related nuclear cataract, left eye: Secondary | ICD-10-CM | POA: Insufficient documentation

## 2020-10-12 DIAGNOSIS — E1136 Type 2 diabetes mellitus with diabetic cataract: Secondary | ICD-10-CM | POA: Insufficient documentation

## 2020-10-12 DIAGNOSIS — Z9841 Cataract extraction status, right eye: Secondary | ICD-10-CM | POA: Insufficient documentation

## 2020-10-12 DIAGNOSIS — Z7984 Long term (current) use of oral hypoglycemic drugs: Secondary | ICD-10-CM | POA: Insufficient documentation

## 2020-10-12 HISTORY — PX: CATARACT EXTRACTION W/PHACO: SHX586

## 2020-10-12 LAB — GLUCOSE, CAPILLARY
Glucose-Capillary: 284 mg/dL — ABNORMAL HIGH (ref 70–99)
Glucose-Capillary: 283 mg/dL — ABNORMAL HIGH (ref 70–99)

## 2020-10-12 SURGERY — PHACOEMULSIFICATION, CATARACT, WITH IOL INSERTION
Anesthesia: Monitor Anesthesia Care | Site: Eye | Laterality: Left

## 2020-10-12 MED ORDER — ARMC OPHTHALMIC DILATING DROPS
1.0000 "application " | OPHTHALMIC | Status: DC | PRN
  Administered 2020-10-12 (×3): 1 via OPHTHALMIC

## 2020-10-12 MED ORDER — TETRACAINE HCL 0.5 % OP SOLN
1.0000 [drp] | OPHTHALMIC | Status: DC | PRN
  Administered 2020-10-12 (×3): 1 [drp] via OPHTHALMIC

## 2020-10-12 MED ORDER — MIDAZOLAM HCL 2 MG/2ML IJ SOLN
INTRAMUSCULAR | Status: DC | PRN
  Administered 2020-10-12: 2 mg via INTRAVENOUS

## 2020-10-12 MED ORDER — FENTANYL CITRATE (PF) 100 MCG/2ML IJ SOLN
INTRAMUSCULAR | Status: DC | PRN
  Administered 2020-10-12 (×2): 50 ug via INTRAVENOUS

## 2020-10-12 MED ORDER — NA HYALUR & NA CHOND-NA HYALUR 0.4-0.35 ML IO KIT
PACK | INTRAOCULAR | Status: DC | PRN
  Administered 2020-10-12: 1 mL via INTRAOCULAR

## 2020-10-12 MED ORDER — BRIMONIDINE TARTRATE-TIMOLOL 0.2-0.5 % OP SOLN
OPHTHALMIC | Status: DC | PRN
  Administered 2020-10-12: 1 [drp] via OPHTHALMIC

## 2020-10-12 MED ORDER — ONDANSETRON HCL 4 MG/2ML IJ SOLN
4.0000 mg | Freq: Once | INTRAMUSCULAR | Status: DC | PRN
Start: 2020-10-12 — End: 2020-10-12

## 2020-10-12 MED ORDER — ACETAMINOPHEN 325 MG PO TABS
325.0000 mg | ORAL_TABLET | ORAL | Status: DC | PRN
Start: 2020-10-12 — End: 2020-10-12

## 2020-10-12 MED ORDER — CEFUROXIME OPHTHALMIC INJECTION 1 MG/0.1 ML
INJECTION | OPHTHALMIC | Status: DC | PRN
  Administered 2020-10-12: 0.1 mL via INTRACAMERAL

## 2020-10-12 MED ORDER — LACTATED RINGERS IV SOLN: INTRAVENOUS | Status: DC

## 2020-10-12 MED ORDER — ACETAMINOPHEN 160 MG/5ML PO SOLN
325.0000 mg | ORAL | Status: DC | PRN
Start: 2020-10-12 — End: 2020-10-12

## 2020-10-12 MED ORDER — EPINEPHRINE PF 1 MG/ML IJ SOLN
INTRAOCULAR | Status: DC | PRN
  Administered 2020-10-12: 86 mL via OPHTHALMIC

## 2020-10-12 MED ORDER — LIDOCAINE HCL (PF) 2 % IJ SOLN
INTRAOCULAR | Status: DC | PRN
  Administered 2020-10-12: 2 mL

## 2020-10-12 SURGICAL SUPPLY — 18 items
CANNULA ANT/CHMB 27GA (MISCELLANEOUS) ×2 IMPLANT
GLOVE SURG ENC TEXT LTX SZ7.5 (GLOVE) ×2 IMPLANT
GLOVE SURG TRIUMPH 8.0 PF LTX (GLOVE) ×2 IMPLANT
GOWN STRL REUS W/ TWL LRG LVL3 (GOWN DISPOSABLE) ×2 IMPLANT
GOWN STRL REUS W/TWL LRG LVL3 (GOWN DISPOSABLE) ×4
LENS IOL TECNIS EYHANCE 22.5 (Intraocular Lens) ×2 IMPLANT
MARKER SKIN DUAL TIP RULER LAB (MISCELLANEOUS) ×2 IMPLANT
NEEDLE CAPSULORHEX 25GA (NEEDLE) ×2 IMPLANT
NEEDLE FILTER BLUNT 18X 1/2SAF (NEEDLE) ×2
NEEDLE FILTER BLUNT 18X1 1/2 (NEEDLE) ×2 IMPLANT
PACK CATARACT BRASINGTON (MISCELLANEOUS) ×2 IMPLANT
PACK EYE AFTER SURG (MISCELLANEOUS) ×2 IMPLANT
PACK OPTHALMIC (MISCELLANEOUS) ×2 IMPLANT
SOLUTION OPHTHALMIC SALT (MISCELLANEOUS) ×2 IMPLANT
SYR 3ML LL SCALE MARK (SYRINGE) ×4 IMPLANT
SYR TB 1ML LUER SLIP (SYRINGE) ×2 IMPLANT
WATER STERILE IRR 250ML POUR (IV SOLUTION) ×2 IMPLANT
WIPE NON LINTING 3.25X3.25 (MISCELLANEOUS) ×2 IMPLANT

## 2020-10-12 NOTE — H&P (Signed)
[facility]   Primary Care Physician:  Barbette Reichmann, MD Ophthalmologist: Dr. Lockie Mola  Pre-Procedure History & Physical: HPI:  Brendan Pruitt is a 45 y.o. male here for ophthalmic surgery.   Past Medical History:  Diagnosis Date  . Acute osteomyelitis (HCC) 10/2019   phalanx left foot  . Cellulitis and abscess of foot 10/2019   left.  using betadine wet to dry dressings.  treated with multiple antibiotics. + pseudomonas  . Diabetes mellitus without complication (HCC)   . Elevated hemoglobin A1c 10/2019   results are 12.5%  . Gangrene (HCC) 10/2019   left toe  . Hypertension   . Neuromuscular disorder (HCC)    diabetic neuropathy    Past Surgical History:  Procedure Laterality Date  . AMPUTATION TOE Left 11/06/2019   Procedure: AMPUTATION TOE IPJ LEFT HALLUX;  Surgeon: Rosetta Posner, DPM;  Location: [facility] ORS;  Service: Podiatry;  Laterality: Left;  . CATARACT EXTRACTION W/PHACO Right 09/21/2020   Procedure: CATARACT EXTRACTION PHACO AND INTRAOCULAR LENS PLACEMENT (IOC) RIGHT DIABETIC;  Surgeon: Lockie Mola, MD;  Location: [facility];  Service: Ophthalmology;  Laterality: Right;  Diabetic - oral meds cde 9.48 01:48.8 minutes 8.7%  . LUMBAR DISC SURGERY  2010   no metal  . UMBILICAL HERNIA REPAIR  2010   mesh     Prior to Admission medications   Medication Sig Start Date End Date Taking? Authorizing Provider  dapagliflozin propanediol (FARXIGA) 10 MG TABS tablet Take 10 mg by mouth daily.   Yes [provider]  gabapentin (NEURONTIN) 100 MG capsule Take 300 mg by mouth 2 (two) times daily. 10/23/19  Yes [provider]  glipiZIDE (GLUCOTROL XL) 10 MG 24 hr tablet Take 10 mg by mouth 2 (two) times daily. 10/17/19  Yes [provider]  lactulose (CHRONULAC) 10 GM/15ML solution Take 30 mLs (20 g total) by mouth daily as needed for mild constipation. 11/13/19  Yes Irean Hong, MD  losartan (COZAAR) 25 MG tablet  Take 25 mg by mouth daily.   Yes [provider]  lubiprostone (AMITIZA) 24 MCG capsule Take 1 capsule (24 mcg total) by mouth 2 (two) times daily with a meal. 11/15/19  Yes Sharyn Creamer, MD  metFORMIN (GLUCOPHAGE) 1000 MG tablet Take 1,000 mg by mouth 2 (two) times daily with a meal.  08/05/15  Yes [provider]  Multiple Vitamin (ONE-A-DAY MENS PO) Take by mouth daily.   Yes [provider]  Omega-3 Fatty Acids (OMEGA 3 PO) Take 1,080 mg by mouth in the morning and at bedtime.   Yes [provider]  OZEMPIC, 1 MG/DOSE, 4 MG/3ML SOPN Inject 1 mg into the skin every Sunday.  10/23/19  Yes [provider]  povidone-Iodine (BETADINE) 5 % SOLN topical solution Apply 1 application topically as needed for wound care.   Yes [provider]    Allergies as of 08/02/2020  . (No Known Allergies)    Family History  Problem Relation Age of Onset  . Diabetes Mother   . Diabetes Sister     Social History   Socioeconomic History  . Marital status: Married    Spouse name: Publishing rights manager  . Number of children: Not on file  . Years of education: Not on file  . Highest education level: Not on file  Occupational History  . Not on file  Tobacco Use  . Smoking status: Former Smoker    Packs/day: 0.25    Years: 4.00  Pack years: 1.00    Types: Cigarettes    Quit date: 2016    Years since quitting: 6.4  . Smokeless tobacco: Never Used  Vaping Use  . Vaping Use: Never used  Substance and Sexual Activity  . Alcohol use: Yes    Alcohol/week: 20.0 standard drinks    Types: 20 Cans of beer per week    Comment: does not drink much lately  . Drug use: No  . Sexual activity: Yes  Other Topics Concern  . Not on file  Social History Narrative   Patient lives with his wife and 5 children.   Social Determinants of Health   Financial Resource Strain: Not on file  Food Insecurity: Not on file  Transportation Needs: Not on file  Physical Activity:  Not on file  Stress: Not on file  Social Connections: Not on file  Intimate Partner Violence: Not on file    Review of Systems: See HPI, otherwise negative ROS  Physical Exam: BP (!) 149/95   Pulse 99   Temp 98.5 F (36.9 C) (Temporal)   Resp 18   Ht 5\' 5"  (1.651 m)   Wt 95.3 kg   SpO2 99%   BMI 34.95 kg/m  General:   Alert,  pleasant and cooperative in NAD Head:  Normocephalic and atraumatic. Lungs:  Clear to auscultation.    Heart:  Regular rate and rhythm.   Impression/Plan: Sigfredo Schreier is here for ophthalmic surgery.  Risks, benefits, limitations, and alternatives regarding ophthalmic surgery have been reviewed with the patient.  Questions have been answered.  All parties agreeable.   [name], MD  10/12/2020, 10:25 AM

## 2020-10-12 NOTE — Anesthesia Procedure Notes (Signed)
Procedure Name: MAC Date/Time: 10/12/2020 11:35 AM Performed by: Mayme Genta, CRNA Pre-anesthesia Checklist: Patient identified, Emergency Drugs available, Suction available, Timeout performed and Patient being monitored Patient Re-evaluated:Patient Re-evaluated prior to induction Oxygen Delivery Method: Nasal cannula Placement Confirmation: positive ETCO2

## 2020-10-12 NOTE — Transfer of Care (Signed)
Immediate Anesthesia Transfer of Care Note  Patient: Brendan Pruitt  Procedure(s) Performed: CATARACT EXTRACTION PHACO AND INTRAOCULAR LENS PLACEMENT (IOC) LEFT DIABETIC 8.16 01:37.0 (Left Eye)  Patient Location: PACU  Anesthesia Type: MAC  Level of Consciousness: awake, alert  and patient cooperative  Airway and Oxygen Therapy: Patient Spontanous Breathing and Patient connected to supplemental oxygen  Post-op Assessment: Post-op Vital signs reviewed, Patient's Cardiovascular Status Stable, Respiratory Function Stable, Patent Airway and No signs of Nausea or vomiting  Post-op Vital Signs: Reviewed and stable  Complications: No complications documented.

## 2020-10-12 NOTE — Anesthesia Preprocedure Evaluation (Signed)
Anesthesia Evaluation  Patient identified by MRN, date of birth, ID band Patient awake    Reviewed: Allergy & Precautions, NPO status , Patient's Chart, lab work & pertinent test results  History of Anesthesia Complications Negative for: history of anesthetic complications  Airway Mallampati: II  TM Distance: >3 FB Neck ROM: full    Dental no notable dental hx.    Pulmonary neg pulmonary ROS, former smoker,    Pulmonary exam normal breath sounds clear to auscultation       Cardiovascular Exercise Tolerance: Good hypertension, Normal cardiovascular exam Rhythm:Regular Rate:Normal     Neuro/Psych Neuropathy leg  Neuromuscular disease negative psych ROS   GI/Hepatic negative GI ROS, Neg liver ROS,   Endo/Other  diabetesMorbid obesity (bmi 34)diabetic neuropathy  Renal/GU negative Renal ROS  negative genitourinary   Musculoskeletal negative musculoskeletal ROS (+) Hx toe amputation   Abdominal (+) + obese,   Peds negative pediatric ROS (+)  Hematology negative hematology ROS (+)   Anesthesia Other Findings pcp: Hande, Rober Minion, MD at 09/05/2020;  bilateral feet : open wounds of both lower extremities.   He states that the wounds occurred after using a massaging machine.    Reproductive/Obstetrics negative OB ROS                             Anesthesia Physical  Anesthesia Plan  ASA: III  Anesthesia Plan: MAC   Post-op Pain Management:    Induction: Intravenous  PONV Risk Score and Plan: 1 and Treatment may vary due to age or medical condition  Airway Management Planned: Nasal Cannula  Additional Equipment:   Intra-op Plan:   Post-operative Plan:   Informed Consent: I have reviewed the patients History and Physical, chart, labs and discussed the procedure including the risks, benefits and alternatives for the proposed anesthesia with the patient or authorized  representative who has indicated his/her understanding and acceptance.       Plan Discussed with: CRNA  Anesthesia Plan Comments:         Anesthesia Quick Evaluation  Patient Active Problem List   Diagnosis Date Noted  . Type 2 diabetes mellitus with hyperglycemia, without long-term current use of insulin ([place]) 09/12/2020  . Ulcers of both lower legs ([place]) 09/12/2020  . History of amputation of left great toe ([place]) 05/31/2020  . Blurring of vision 03/11/2020  . History of 2019 novel coronavirus disease (COVID-19) 03/11/2020  . Diabetic polyneuropathy associated with type 2 diabetes mellitus ([place]) 10/17/2019  . Hyperlipemia, mixed 08/02/2015  . Tobacco use disorder 09/17/2014    CBC Latest Ref Rng & Units 11/15/2019 11/05/2019 11/23/2014  WBC 4.0 - 10.5 K/uL 10.2 7.6 14.8(H)  Hemoglobin 13.0 - 17.0 g/dL 13.4 13.9 15.1  Hematocrit 39.0 - 52.0 % 37.8(L) 39.0 44.8  Platelets 150 - 400 K/uL 197 259 150   BMP Latest Ref Rng & Units 11/15/2019 11/23/2014  Glucose 70 - 99 mg/dL 137(H) 316(H)  BUN 6 - 20 mg/dL 13 13  Creatinine 0.61 - 1.24 mg/dL 0.94 0.90  Sodium 135 - 145 mmol/L 133(L) 136  Potassium 3.5 - 5.1 mmol/L 3.6 4.1  Chloride 98 - 111 mmol/L 97(L) 102  CO2 22 - 32 mmol/L 25 25  Calcium 8.9 - 10.3 mg/dL 9.1 9.3    Risks and benefits of anesthesia discussed at length, patient or surrogate demonstrates understanding. Appropriately NPO. Plan to proceed with anesthesia.  Champ Mungo, MD 10/12/20

## 2020-10-12 NOTE — Op Note (Signed)
  OPERATIVE NOTE  Ladd Cen [id number] 10/12/2020   PREOPERATIVE DIAGNOSIS:  Nuclear sclerotic cataract left eye. H25.12   POSTOPERATIVE DIAGNOSIS:    Nuclear sclerotic cataract left eye.     PROCEDURE:  Phacoemusification with posterior chamber intraocular lens placement of the left eye  Ultrasound time: Procedure(s) with comments: CATARACT EXTRACTION PHACO AND INTRAOCULAR LENS PLACEMENT (IOC) LEFT DIABETIC 8.16 01:37.0 (Left) - Diabetic - oral meds  LENS:   Implant Name Type Inv. Item Serial No. Manufacturer Lot No. LRB No. Used Action  LENS IOL TECNIS EYHANCE 22.5 - [id number] Intraocular Lens LENS IOL TECNIS EYHANCE 22.5 [id number] JOHNSON   Left 1 Implanted      SURGEON:  Deirdre Evener, MD   ANESTHESIA:  Topical with tetracaine drops and 2% Xylocaine jelly, augmented with 1% preservative-free intracameral lidocaine.    COMPLICATIONS:  None.   DESCRIPTION OF PROCEDURE:  The patient was identified in the holding room and transported to the operating room and placed in the supine position under the operating microscope.  The left eye was identified as the operative eye and it was prepped and draped in the usual sterile ophthalmic fashion.   A 1 millimeter clear-corneal paracentesis was made at the 1:30 position.  0.5 ml of preservative-free 1% lidocaine was injected into the anterior chamber.  The anterior chamber was filled with Viscoat viscoelastic.  A 2.4 millimeter keratome was used to make a near-clear corneal incision at the 10:30 position.  .  A curvilinear capsulorrhexis was made with a cystotome and capsulorrhexis forceps.  Balanced salt solution was used to hydrodissect and hydrodelineate the nucleus.   Phacoemulsification was then used in stop and chop fashion to remove the lens nucleus and epinucleus.  The remaining cortex was then removed using the irrigation and aspiration handpiece. Provisc was then placed into the capsular bag to distend it for lens  placement.  A lens was then injected into the capsular bag.  The remaining viscoelastic was aspirated.   Wounds were hydrated with balanced salt solution.  The anterior chamber was inflated to a physiologic pressure with balanced salt solution.  No wound leaks were noted. Cefuroxime 0.1 ml of a 10mg /ml solution was injected into the anterior chamber for a dose of 1 mg of intracameral antibiotic at the completion of the case.   Timolol and Brimonidine drops were applied to the eye.  The patient was taken to the recovery room in stable condition without complications of anesthesia or surgery.  Maron Stanzione 10/12/2020, 11:41 AM

## 2020-10-12 NOTE — Anesthesia Postprocedure Evaluation (Signed)
Anesthesia Post Note  Patient: Brendan Pruitt  Procedure(s) Performed: CATARACT EXTRACTION PHACO AND INTRAOCULAR LENS PLACEMENT (IOC) LEFT DIABETIC 8.16 01:37.0 (Left Eye)     Patient location during evaluation: PACU Anesthesia Type: MAC Level of consciousness: awake and alert Pain management: pain level controlled Vital Signs Assessment: post-procedure vital signs reviewed and stable Respiratory status: spontaneous breathing, nonlabored ventilation, respiratory function stable and patient connected to nasal cannula oxygen Cardiovascular status: stable and blood pressure returned to baseline Postop Assessment: no apparent nausea or vomiting Anesthetic complications: no   No complications documented.  Sinda Du

## 2020-10-13 ENCOUNTER — Encounter: Payer: Self-pay | Admitting: Ophthalmology

## 2020-11-14 ENCOUNTER — Inpatient Hospital Stay: Payer: Commercial Managed Care - PPO | Admitting: Anesthesiology

## 2020-11-14 ENCOUNTER — Emergency Department: Payer: Commercial Managed Care - PPO

## 2020-11-14 ENCOUNTER — Encounter
Admission: EM | Disposition: A | Discharge status: Home / Self Care | Payer: Self-pay | Source: Home / Self Care | Attending: Internal Medicine

## 2020-11-14 ENCOUNTER — Other Ambulatory Visit: Payer: Self-pay

## 2020-11-14 ENCOUNTER — Inpatient Hospital Stay: Payer: Commercial Managed Care - PPO

## 2020-11-14 ENCOUNTER — Inpatient Hospital Stay
Admission: EM | Admit: 2020-11-14 | Discharge: 2020-11-18 | DRG: 854 | Disposition: A | Discharge status: Home / Self Care | Payer: Commercial Managed Care - PPO | Attending: Internal Medicine | Admitting: Internal Medicine

## 2020-11-14 DIAGNOSIS — Z89422 Acquired absence of other left toe(s): Secondary | ICD-10-CM

## 2020-11-14 DIAGNOSIS — F1090 Alcohol use, unspecified, uncomplicated: Secondary | ICD-10-CM | POA: Insufficient documentation

## 2020-11-14 DIAGNOSIS — Z833 Family history of diabetes mellitus: Secondary | ICD-10-CM

## 2020-11-14 DIAGNOSIS — E11621 Type 2 diabetes mellitus with foot ulcer: Secondary | ICD-10-CM | POA: Diagnosis present

## 2020-11-14 DIAGNOSIS — A419 Sepsis, unspecified organism: Principal | ICD-10-CM | POA: Diagnosis present

## 2020-11-14 DIAGNOSIS — E1169 Type 2 diabetes mellitus with other specified complication: Secondary | ICD-10-CM | POA: Diagnosis present

## 2020-11-14 DIAGNOSIS — E1142 Type 2 diabetes mellitus with diabetic polyneuropathy: Secondary | ICD-10-CM | POA: Diagnosis present

## 2020-11-14 DIAGNOSIS — L03115 Cellulitis of right lower limb: Secondary | ICD-10-CM | POA: Diagnosis present

## 2020-11-14 DIAGNOSIS — E785 Hyperlipidemia, unspecified: Secondary | ICD-10-CM | POA: Diagnosis present

## 2020-11-14 DIAGNOSIS — I10 Essential (primary) hypertension: Secondary | ICD-10-CM | POA: Diagnosis present

## 2020-11-14 DIAGNOSIS — M79671 Pain in right foot: Secondary | ICD-10-CM | POA: Diagnosis present

## 2020-11-14 DIAGNOSIS — Z79899 Other long term (current) drug therapy: Secondary | ICD-10-CM | POA: Diagnosis not present

## 2020-11-14 DIAGNOSIS — Z7984 Long term (current) use of oral hypoglycemic drugs: Secondary | ICD-10-CM | POA: Diagnosis not present

## 2020-11-14 DIAGNOSIS — F1011 Alcohol abuse, in remission: Secondary | ICD-10-CM | POA: Diagnosis present

## 2020-11-14 DIAGNOSIS — Z789 Other specified health status: Secondary | ICD-10-CM | POA: Insufficient documentation

## 2020-11-14 DIAGNOSIS — L089 Local infection of the skin and subcutaneous tissue, unspecified: Secondary | ICD-10-CM | POA: Diagnosis present

## 2020-11-14 DIAGNOSIS — Z9109 Other allergy status, other than to drugs and biological substances: Secondary | ICD-10-CM

## 2020-11-14 DIAGNOSIS — L97516 Non-pressure chronic ulcer of other part of right foot with bone involvement without evidence of necrosis: Secondary | ICD-10-CM | POA: Diagnosis not present

## 2020-11-14 DIAGNOSIS — M86171 Other acute osteomyelitis, right ankle and foot: Secondary | ICD-10-CM | POA: Diagnosis present

## 2020-11-14 DIAGNOSIS — Z87891 Personal history of nicotine dependence: Secondary | ICD-10-CM

## 2020-11-14 DIAGNOSIS — Z7289 Other problems related to lifestyle: Secondary | ICD-10-CM | POA: Insufficient documentation

## 2020-11-14 DIAGNOSIS — F17200 Nicotine dependence, unspecified, uncomplicated: Secondary | ICD-10-CM | POA: Diagnosis present

## 2020-11-14 DIAGNOSIS — E1152 Type 2 diabetes mellitus with diabetic peripheral angiopathy with gangrene: Secondary | ICD-10-CM | POA: Diagnosis present

## 2020-11-14 DIAGNOSIS — Z23 Encounter for immunization: Secondary | ICD-10-CM | POA: Diagnosis not present

## 2020-11-14 DIAGNOSIS — Z20822 Contact with and (suspected) exposure to covid-19: Secondary | ICD-10-CM | POA: Diagnosis present

## 2020-11-14 DIAGNOSIS — R52 Pain, unspecified: Secondary | ICD-10-CM | POA: Diagnosis not present

## 2020-11-14 DIAGNOSIS — L97529 Non-pressure chronic ulcer of other part of left foot with unspecified severity: Secondary | ICD-10-CM | POA: Diagnosis present

## 2020-11-14 DIAGNOSIS — E1140 Type 2 diabetes mellitus with diabetic neuropathy, unspecified: Secondary | ICD-10-CM | POA: Diagnosis present

## 2020-11-14 DIAGNOSIS — E1143 Type 2 diabetes mellitus with diabetic autonomic (poly)neuropathy: Secondary | ICD-10-CM | POA: Diagnosis not present

## 2020-11-14 DIAGNOSIS — L97519 Non-pressure chronic ulcer of other part of right foot with unspecified severity: Secondary | ICD-10-CM | POA: Diagnosis present

## 2020-11-14 DIAGNOSIS — E11628 Type 2 diabetes mellitus with other skin complications: Secondary | ICD-10-CM | POA: Diagnosis present

## 2020-11-14 DIAGNOSIS — E1165 Type 2 diabetes mellitus with hyperglycemia: Secondary | ICD-10-CM

## 2020-11-14 DIAGNOSIS — L02611 Cutaneous abscess of right foot: Secondary | ICD-10-CM | POA: Diagnosis present

## 2020-11-14 DIAGNOSIS — E119 Type 2 diabetes mellitus without complications: Secondary | ICD-10-CM | POA: Diagnosis present

## 2020-11-14 HISTORY — PX: I & D EXTREMITY: SHX5045

## 2020-11-14 LAB — CBC WITH DIFFERENTIAL/PLATELET
Abs Immature Granulocytes: 0.1 10*3/uL — ABNORMAL HIGH (ref 0.00–0.07)
Basophils Absolute: 0.1 10*3/uL (ref 0.0–0.1)
Basophils Relative: 1 %
Eosinophils Absolute: 0.1 10*3/uL (ref 0.0–0.5)
Eosinophils Relative: 1 %
HCT: 41.7 % (ref 39.0–52.0)
Hemoglobin: 14.2 g/dL (ref 13.0–17.0)
Immature Granulocytes: 1 %
Lymphocytes Relative: 17 %
Lymphs Abs: 2 10*3/uL (ref 0.7–4.0)
MCH: 33.6 pg (ref 26.0–34.0)
MCHC: 34.1 g/dL (ref 30.0–36.0)
MCV: 98.8 fL (ref 80.0–100.0)
Monocytes Absolute: 1.5 10*3/uL — ABNORMAL HIGH (ref 0.1–1.0)
Monocytes Relative: 13 %
Neutro Abs: 7.7 10*3/uL (ref 1.7–7.7)
Neutrophils Relative %: 67 %
Platelets: 164 10*3/uL (ref 150–400)
RBC: 4.22 MIL/uL (ref 4.22–5.81)
RDW: 13 % (ref 11.5–15.5)
WBC: 11.5 10*3/uL — ABNORMAL HIGH (ref 4.0–10.5)
nRBC: 0 % (ref 0.0–0.2)

## 2020-11-14 LAB — SEDIMENTATION RATE: Sed Rate: 72 mm/hr — ABNORMAL HIGH (ref 0–15)

## 2020-11-14 LAB — HIV ANTIBODY (ROUTINE TESTING W REFLEX): HIV Screen 4th Generation wRfx: NONREACTIVE

## 2020-11-14 LAB — RESP PANEL BY RT-PCR (FLU A&B, COVID) ARPGX2
Influenza A by PCR: NEGATIVE
Influenza B by PCR: NEGATIVE
SARS Coronavirus 2 by RT PCR: NEGATIVE

## 2020-11-14 LAB — COMPREHENSIVE METABOLIC PANEL
ALT: 30 U/L (ref 0–44)
AST: 29 U/L (ref 15–41)
Albumin: 3.9 g/dL (ref 3.5–5.0)
Alkaline Phosphatase: 128 U/L — ABNORMAL HIGH (ref 38–126)
Anion gap: 11 (ref 5–15)
BUN: 14 mg/dL (ref 6–20)
CO2: 24 mmol/L (ref 22–32)
Calcium: 9.3 mg/dL (ref 8.9–10.3)
Chloride: 98 mmol/L (ref 98–111)
Creatinine, Ser: 0.77 mg/dL (ref 0.61–1.24)
GFR, Estimated: >60 mL/min (ref >60)
Glucose, Bld: 241 mg/dL — ABNORMAL HIGH (ref 70–99)
Potassium: 3.8 mmol/L (ref 3.5–5.1)
Sodium: 133 mmol/L — ABNORMAL LOW (ref 135–145)
Total Bilirubin: 1.1 mg/dL (ref 0.3–1.2)
Total Protein: 8.3 g/dL — ABNORMAL HIGH (ref 6.5–8.1)

## 2020-11-14 LAB — CBG MONITORING, ED
Glucose-Capillary: 141 mg/dL — ABNORMAL HIGH (ref 70–99)
Glucose-Capillary: 147 mg/dL — ABNORMAL HIGH (ref 70–99)

## 2020-11-14 LAB — PROTIME-INR
INR: 1.1 (ref 0.8–1.2)
Prothrombin Time: 14.1 seconds (ref 11.4–15.2)

## 2020-11-14 LAB — C-REACTIVE PROTEIN: CRP: 14 mg/dL — ABNORMAL HIGH (ref <1.0)

## 2020-11-14 LAB — LACTIC ACID, PLASMA: Lactic Acid, Venous: 1.2 mmol/L (ref 0.5–1.9)

## 2020-11-14 LAB — GLUCOSE, CAPILLARY
Glucose-Capillary: 142 mg/dL — ABNORMAL HIGH (ref 70–99)
Glucose-Capillary: 147 mg/dL — ABNORMAL HIGH (ref 70–99)

## 2020-11-14 LAB — PROCALCITONIN: Procalcitonin: <0.1 ng/mL

## 2020-11-14 LAB — APTT: aPTT: 34 seconds (ref 24–36)

## 2020-11-14 SURGERY — IRRIGATION AND DEBRIDEMENT EXTREMITY
Anesthesia: General | Site: Foot | Laterality: Right

## 2020-11-14 MED ORDER — PHENYLEPHRINE HCL (PRESSORS) 10 MG/ML IV SOLN
INTRAVENOUS | Status: AC
  Filled 2020-11-14: qty 1

## 2020-11-14 MED ORDER — KETOROLAC TROMETHAMINE 30 MG/ML IJ SOLN: 30.0000 mg | Freq: Once | INTRAMUSCULAR | Status: DC | PRN

## 2020-11-14 MED ORDER — ADULT MULTIVITAMIN W/MINERALS CH
1.0000 | ORAL_TABLET | Freq: Every day | ORAL | Status: DC
  Administered 2020-11-14 – 2020-11-18 (×5): 1 via ORAL
  Filled 2020-11-14 (×5): qty 1

## 2020-11-14 MED ORDER — INSULIN ASPART 100 UNIT/ML IJ SOLN
0.0000 [IU] | Freq: Every day | INTRAMUSCULAR | Status: DC
  Administered 2020-11-16 – 2020-11-17 (×2): 2 [IU] via SUBCUTANEOUS
  Filled 2020-11-14 (×2): qty 1

## 2020-11-14 MED ORDER — VANCOMYCIN HCL IN DEXTROSE 1-5 GM/200ML-% IV SOLN
1000.0000 mg | Freq: Once | INTRAVENOUS | Status: AC
  Administered 2020-11-14: 1000 mg via INTRAVENOUS
  Filled 2020-11-14: qty 200

## 2020-11-14 MED ORDER — PROPOFOL 10 MG/ML IV BOLUS
INTRAVENOUS | Status: AC
  Filled 2020-11-14: qty 20

## 2020-11-14 MED ORDER — BUPIVACAINE HCL (PF) 0.5 % IJ SOLN
INTRAMUSCULAR | Status: DC | PRN
  Administered 2020-11-14: 9 mL

## 2020-11-14 MED ORDER — FENTANYL CITRATE (PF) 100 MCG/2ML IJ SOLN
INTRAMUSCULAR | Status: AC
  Filled 2020-11-14: qty 2

## 2020-11-14 MED ORDER — OXYCODONE HCL 5 MG/5ML PO SOLN: 5.0000 mg | Freq: Once | ORAL | Status: DC | PRN

## 2020-11-14 MED ORDER — PHENYLEPHRINE HCL (PRESSORS) 10 MG/ML IV SOLN
INTRAVENOUS | Status: DC | PRN
  Administered 2020-11-14 (×7): 100 ug via INTRAVENOUS

## 2020-11-14 MED ORDER — OXYCODONE-ACETAMINOPHEN 5-325 MG PO TABS
1.0000 | ORAL_TABLET | ORAL | Status: DC | PRN
  Administered 2020-11-15: 1 via ORAL
  Administered 2020-11-16: 2 via ORAL
  Administered 2020-11-16 – 2020-11-17 (×2): 1 via ORAL
  Administered 2020-11-18: 2 via ORAL
  Filled 2020-11-14 (×2): qty 1
  Filled 2020-11-14 (×2): qty 2
  Filled 2020-11-14: qty 1

## 2020-11-14 MED ORDER — SUCCINYLCHOLINE CHLORIDE 200 MG/10ML IV SOSY
PREFILLED_SYRINGE | INTRAVENOUS | Status: AC
  Filled 2020-11-14: qty 10

## 2020-11-14 MED ORDER — OXYCODONE HCL 5 MG PO TABS: 5.0000 mg | ORAL_TABLET | Freq: Once | ORAL | Status: DC | PRN

## 2020-11-14 MED ORDER — OXYCODONE-ACETAMINOPHEN 5-325 MG PO TABS: 1.0000 | ORAL_TABLET | ORAL | Status: DC | PRN

## 2020-11-14 MED ORDER — MIDAZOLAM HCL 2 MG/2ML IJ SOLN
INTRAMUSCULAR | Status: DC | PRN
  Administered 2020-11-14: 2 mg via INTRAVENOUS

## 2020-11-14 MED ORDER — ONDANSETRON HCL 4 MG/2ML IJ SOLN
INTRAMUSCULAR | Status: DC | PRN
  Administered 2020-11-14: 4 mg via INTRAVENOUS

## 2020-11-14 MED ORDER — SODIUM CHLORIDE 0.9 % IV SOLN: INTRAVENOUS | Status: DC

## 2020-11-14 MED ORDER — OMEGA-3-ACID ETHYL ESTERS 1 G PO CAPS
1.0000 g | ORAL_CAPSULE | Freq: Every day | ORAL | Status: DC
  Administered 2020-11-14 – 2020-11-18 (×5): 1 g via ORAL
  Filled 2020-11-14 (×5): qty 1

## 2020-11-14 MED ORDER — SODIUM CHLORIDE 0.9 % IV SOLN: 2.0000 g | INTRAVENOUS | Status: DC

## 2020-11-14 MED ORDER — SUCCINYLCHOLINE CHLORIDE 20 MG/ML IJ SOLN
INTRAMUSCULAR | Status: DC | PRN
  Administered 2020-11-14: 120 mg via INTRAVENOUS

## 2020-11-14 MED ORDER — INSULIN ASPART 100 UNIT/ML IJ SOLN
0.0000 [IU] | Freq: Three times a day (TID) | INTRAMUSCULAR | Status: DC
  Administered 2020-11-14: 1 [IU] via SUBCUTANEOUS
  Administered 2020-11-15 (×3): 2 [IU] via SUBCUTANEOUS
  Administered 2020-11-16: 1 [IU] via SUBCUTANEOUS
  Administered 2020-11-16: 5 [IU] via SUBCUTANEOUS
  Administered 2020-11-16 – 2020-11-17 (×3): 2 [IU] via SUBCUTANEOUS
  Administered 2020-11-17: 3 [IU] via SUBCUTANEOUS
  Administered 2020-11-18: 1 [IU] via SUBCUTANEOUS
  Administered 2020-11-18: 3 [IU] via SUBCUTANEOUS
  Filled 2020-11-14 (×12): qty 1

## 2020-11-14 MED ORDER — FENTANYL CITRATE (PF) 100 MCG/2ML IJ SOLN
INTRAMUSCULAR | Status: DC | PRN
  Administered 2020-11-14: 100 ug via INTRAVENOUS

## 2020-11-14 MED ORDER — PROPOFOL 10 MG/ML IV BOLUS
INTRAVENOUS | Status: DC | PRN
  Administered 2020-11-14: 170 mg via INTRAVENOUS

## 2020-11-14 MED ORDER — VANCOMYCIN HCL IN DEXTROSE 1-5 GM/200ML-% IV SOLN
1000.0000 mg | Freq: Two times a day (BID) | INTRAVENOUS | Status: DC
  Administered 2020-11-14 – 2020-11-15 (×3): 1000 mg via INTRAVENOUS
  Filled 2020-11-14 (×7): qty 200

## 2020-11-14 MED ORDER — MIDAZOLAM HCL 2 MG/2ML IJ SOLN
INTRAMUSCULAR | Status: AC
  Filled 2020-11-14: qty 2

## 2020-11-14 MED ORDER — SODIUM CHLORIDE 0.9 % IV SOLN
2.0000 g | Freq: Three times a day (TID) | INTRAVENOUS | Status: DC
  Administered 2020-11-15 (×3): 2 g via INTRAVENOUS
  Filled 2020-11-14 (×5): qty 2

## 2020-11-14 MED ORDER — ONDANSETRON HCL 4 MG/2ML IJ SOLN: 4.0000 mg | Freq: Three times a day (TID) | INTRAMUSCULAR | Status: DC | PRN

## 2020-11-14 MED ORDER — ONDANSETRON HCL 4 MG/2ML IJ SOLN
4.0000 mg | Freq: Three times a day (TID) | INTRAMUSCULAR | Status: DC | PRN
  Administered 2020-11-14: 4 mg via INTRAVENOUS
  Filled 2020-11-14: qty 2

## 2020-11-14 MED ORDER — FENTANYL CITRATE (PF) 100 MCG/2ML IJ SOLN: 25.0000 ug | INTRAMUSCULAR | Status: DC | PRN

## 2020-11-14 MED ORDER — HYDRALAZINE HCL 20 MG/ML IJ SOLN: 5.0000 mg | INTRAMUSCULAR | Status: DC | PRN

## 2020-11-14 MED ORDER — ONDANSETRON HCL 4 MG/2ML IJ SOLN
INTRAMUSCULAR | Status: AC
  Filled 2020-11-14: qty 2

## 2020-11-14 MED ORDER — ACETAMINOPHEN 325 MG PO TABS
650.0000 mg | ORAL_TABLET | Freq: Four times a day (QID) | ORAL | Status: DC | PRN
  Administered 2020-11-17: 650 mg via ORAL
  Filled 2020-11-14: qty 2

## 2020-11-14 MED ORDER — LIDOCAINE HCL (CARDIAC) PF 100 MG/5ML IV SOSY
PREFILLED_SYRINGE | INTRAVENOUS | Status: DC | PRN
  Administered 2020-11-14: 100 mg via INTRAVENOUS

## 2020-11-14 MED ORDER — SODIUM CHLORIDE 0.9 % IR SOLN
Status: DC | PRN
  Administered 2020-11-14: 3000 mL
  Administered 2020-11-14: 500 mL

## 2020-11-14 MED ORDER — SODIUM CHLORIDE 0.9 % IV BOLUS
1000.0000 mL | Freq: Once | INTRAVENOUS | Status: AC
  Administered 2020-11-14: 1000 mL via INTRAVENOUS

## 2020-11-14 MED ORDER — ONDANSETRON HCL 4 MG/2ML IJ SOLN: 4.0000 mg | Freq: Once | INTRAMUSCULAR | Status: DC | PRN

## 2020-11-14 MED ORDER — VANCOMYCIN HCL 1000 MG IV SOLR
INTRAVENOUS | Status: DC | PRN
  Administered 2020-11-14: 1000 mg

## 2020-11-14 MED ORDER — GABAPENTIN 300 MG PO CAPS
300.0000 mg | ORAL_CAPSULE | Freq: Two times a day (BID) | ORAL | Status: DC
  Administered 2020-11-14 – 2020-11-18 (×8): 300 mg via ORAL
  Filled 2020-11-14 (×8): qty 1

## 2020-11-14 MED ORDER — MORPHINE SULFATE (PF) 2 MG/ML IV SOLN
2.0000 mg | INTRAVENOUS | Status: DC | PRN
  Administered 2020-11-14 (×2): 2 mg via INTRAVENOUS
  Filled 2020-11-14 (×2): qty 1

## 2020-11-14 MED ORDER — SODIUM CHLORIDE 0.9 % IV SOLN
2.0000 g | Freq: Once | INTRAVENOUS | Status: AC
  Administered 2020-11-14: 2 g via INTRAVENOUS
  Filled 2020-11-14: qty 20

## 2020-11-14 SURGICAL SUPPLY — 14 items
BNDG ELASTIC 4X5.8 VLCR STR LF (GAUZE/BANDAGES/DRESSINGS) ×4 IMPLANT
BNDG GAUZE ELAST 4 BULKY (GAUZE/BANDAGES/DRESSINGS) ×4 IMPLANT
CUFF TOURN SGL QUICK 18 (TOURNIQUET CUFF) ×2 IMPLANT
GAUZE SPONGE 4X4 12PLY STRL (GAUZE/BANDAGES/DRESSINGS) ×2 IMPLANT
GAUZE XEROFORM 1X8 LF (GAUZE/BANDAGES/DRESSINGS) ×2 IMPLANT
HANDPIECE VERSAJET DEBRIDEMENT (MISCELLANEOUS) ×2 IMPLANT
IV NS IRRIG 3000ML ARTHROMATIC (IV SOLUTION) ×2 IMPLANT
KIT STIMULAN RAPID CURE 5CC (Orthopedic Implant) ×2 IMPLANT
NS IRRIG 500ML POUR BTL (IV SOLUTION) ×2 IMPLANT
PAD ABD DERMACEA PRESS 5X9 (GAUZE/BANDAGES/DRESSINGS) ×4 IMPLANT
PULSAVAC PLUS IRRIG FAN TIP (DISPOSABLE) ×2
SUT ETHILON 3-0 FS-10 30 BLK (SUTURE) ×2
SUTURE EHLN 3-0 FS-10 30 BLK (SUTURE) ×1 IMPLANT
TIP FAN IRRIG PULSAVAC PLUS (DISPOSABLE) ×1 IMPLANT

## 2020-11-14 NOTE — ED Notes (Signed)
Lab called, needs blue top and 3 SST tubes. Collected and sent.

## 2020-11-14 NOTE — ED Provider Notes (Signed)
[facility] Emergency Department Provider Note  ____________________________________________   Event Date/Time   First MD Initiated Contact with Patient 11/14/20 0809     (approximate)  I have reviewed the triage vital signs and the nursing notes.   HISTORY  Chief Complaint Wound Infection    HPI Brendan Pruitt is a 45 y.o. male with diabetes who comes in with bilateral feet wound.  Patient reports his right foot wound has purulent drainage and bleeding noted from it.  Patient had x-rays done by his primary doctor and started on antibiotics.  Reports having a fever at home.  Patient reports having a fever for the past 3 days, intermittent, nothing makes it better or worse.  He reports being started on antibiotics on 6/30 with ciprofloxacin.  However 3 days ago he started noticing some drainage from the right lateral foot wound and some increasing pain up into his leg.  Therefore he came into the emergency room to be evaluated.  Patient does report prior to the redness started he had some pain in his calf   On review of records patient is followed by Dr. Excell Seltzer podiatrist.          Past Medical History:  Diagnosis Date   Acute osteomyelitis (HCC) 10/2019   phalanx left foot   Cellulitis and abscess of foot 10/2019   left.  using betadine wet to dry dressings.  treated with multiple antibiotics. 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   Hypertension    Neuromuscular disorder (HCC)    diabetic neuropathy    Patient Active Problem List   Diagnosis Date Noted   Type 2 diabetes mellitus with hyperglycemia, without long-term current use of insulin (HCC) 09/12/2020   Ulcers of both lower legs (HCC) 09/12/2020   History of amputation of left great toe (HCC) 05/31/2020   Blurring of vision 03/11/2020   History of 2019 novel coronavirus  disease (COVID-19) 03/11/2020   Diabetic polyneuropathy associated with type 2 diabetes mellitus (HCC) 10/17/2019   Hyperlipemia, mixed 08/02/2015   Tobacco use disorder 09/17/2014    Past Surgical History:  Procedure Laterality Date   AMPUTATION TOE Left 11/06/2019   Procedure: AMPUTATION TOE IPJ LEFT HALLUX;  Surgeon: Rosetta Posner, DPM;  Location: [facility] ORS;  Service: Podiatry;  Laterality: Left;   CATARACT EXTRACTION W/PHACO Right 09/21/2020   Procedure: CATARACT EXTRACTION PHACO AND INTRAOCULAR LENS PLACEMENT (IOC) RIGHT DIABETIC;  Surgeon: Lockie Mola, MD;  Location: [facility];  Service: Ophthalmology;  Laterality: Right;  Diabetic - oral meds cde 9.48 01:48.8 minutes 8.7%   CATARACT EXTRACTION W/PHACO Left 10/12/2020   Procedure: CATARACT EXTRACTION PHACO AND INTRAOCULAR LENS PLACEMENT (IOC) LEFT DIABETIC 8.16 01:37.0;  Surgeon: Lockie Mola, MD;  Location: [facility];  Service: Ophthalmology;  Laterality: Left;  Diabetic - oral meds   LUMBAR DISC SURGERY  2010   no metal   UMBILICAL HERNIA REPAIR  2010   mesh     Prior to Admission medications   Medication Sig Start Date End Date Taking? Authorizing Provider  dapagliflozin propanediol (FARXIGA) 10 MG TABS tablet Take 10 mg by mouth daily.    [provider]  gabapentin (NEURONTIN) 100 MG capsule Take 300 mg by mouth 2 (two) times daily. 10/23/19   [provider]  glipiZIDE (GLUCOTROL XL) 10 MG 24 hr tablet Take 10 mg  by mouth 2 (two) times daily. 10/17/19   [provider]  lactulose (CHRONULAC) 10 GM/15ML solution Take 30 mLs (20 g total) by mouth daily as needed for mild constipation. 11/13/19   Irean HongSung, Jade J, MD  losartan (COZAAR) 25 MG tablet Take 25 mg by mouth daily.    [provider]  lubiprostone (AMITIZA) 24 MCG capsule Take 1 capsule (24 mcg total) by mouth 2 (two) times daily with a meal. 11/15/19   Sharyn CreamerQuale, Mark, MD  metFORMIN (GLUCOPHAGE) 1000 MG tablet  Take 1,000 mg by mouth 2 (two) times daily with a meal.  08/05/15   [provider]  Multiple Vitamin (ONE-A-DAY MENS PO) Take by mouth daily.    [provider]  Omega-3 Fatty Acids (OMEGA 3 PO) Take 1,080 mg by mouth in the morning and at bedtime.    [provider]  OZEMPIC, 1 MG/DOSE, 4 MG/3ML SOPN Inject 1 mg into the skin every Sunday.  10/23/19   [provider]  povidone-Iodine (BETADINE) 5 % SOLN topical solution Apply 1 application topically as needed for wound care.    [provider]    Allergies Patient has no known allergies.  Family History  Problem Relation Age of Onset   Diabetes Mother    Diabetes Sister     Social History Social History   Tobacco Use   Smoking status: Former    Packs/day: 0.25    Years: 4.00    Pack years: 1.00    Types: Cigarettes    Quit date: 2016    Years since quitting: 6.5   Smokeless tobacco: Never  Vaping Use   Vaping Use: Never used  Substance Use Topics   Alcohol use: Yes    Alcohol/week: 20.0 standard drinks    Types: 20 Cans of beer per week    Comment: does not drink much lately   Drug use: No      Review of Systems Constitutional: fever  Eyes: No visual changes. ENT: No sore throat. Cardiovascular: Denies chest pain. Respiratory: Denies shortness of breath. Gastrointestinal: No abdominal pain.  No nausea, no vomiting.  No diarrhea.  No constipation. Genitourinary: Negative for dysuria. Musculoskeletal: Negative for back pain. Feet wound  Skin: Negative for rash. Neurological: Negative for headaches, focal weakness or numbness. All other ROS negative ____________________________________________   PHYSICAL EXAM:  VITAL SIGNS: ED Triage Vitals [11/14/20 0232]  Enc Vitals Group     BP (!) 151/104     Pulse Rate 100     Resp 16     Temp 98.9 F (37.2 C)     Temp Source Oral     SpO2 100 %     Weight 213 lb (96.6 kg)     Height 5\' 5"  (1.651 m)     Head  Circumference      Peak Flow      Pain Score 10     Pain Loc      Pain Edu?      Excl. in GC?     Constitutional: Alert and oriented. Well appearing and in no acute distress. Eyes: Conjunctivae are normal. EOMI. Head: Atraumatic. Nose: No congestion/rhinnorhea. Mouth/Throat: Mucous membranes are moist.   Neck: No stridor. Trachea Midline. FROM Cardiovascular: Normal rate, regular rhythm. Grossly normal heart sounds.  Good peripheral circulation. Respiratory: Normal respiratory effort.  No retractions. Lungs CTAB. Gastrointestinal: Soft and nontender. No distention. No abdominal bruits.  Musculoskeletal: Warm well-perfused feet.  Ulcer noted to the bottom of  the left foot that does not appear infected.  Also noted to the bottom of the right foot does not appear infected but some purulent drainage noted from a ulcer on the lateral side of the right foot with erythema and redness up into the foot.  Able to range ankle. Neurologic:  Normal speech and language. No gross focal neurologic deficits are appreciated.  Skin:  Skin is warm, dry and intact. No rash noted. Psychiatric: Mood and affect are normal. Speech and behavior are normal. GU: Deferred   ____________________________________________   LABS (all labs ordered are listed, but only abnormal results are displayed)  Labs Reviewed  COMPREHENSIVE METABOLIC PANEL - Abnormal; Notable for the following components:      Result Value   Sodium 133 (*)    Glucose, Bld 241 (*)    Total Protein 8.3 (*)    Alkaline Phosphatase 128 (*)    All other components within normal limits  CBC WITH DIFFERENTIAL/PLATELET - Abnormal; Notable for the following components:   WBC 11.5 (*)    Monocytes Absolute 1.5 (*)    Abs Immature Granulocytes 0.10 (*)    All other components within normal limits  LACTIC ACID, PLASMA   ____________________________________________  RADIOLOGY Vela Prose, personally viewed and evaluated these images (plain  radiographs) as part of my medical decision making, as well as reviewing the written report by the radiologist.  ED MD interpretation: No osteo-  Official radiology report(s): DG Foot Complete Right  Result Date: 11/14/2020 CLINICAL DATA:  45 year old male with diabetic foot wounds. Purulent drainage and bleeding. EXAM: RIGHT FOOT COMPLETE - 3+ VIEW COMPARISON:  None. FINDINGS: Calcified peripheral vascular disease. Normal background bone mineralization. Dystrophic calcifications at the anterior distal tibia. No soft tissue gas. Generalized distal soft tissue swelling in the right foot. No fracture or dislocation. No cortical osteolysis identified. IMPRESSION: 1. Soft tissue swelling with no radiographic evidence of osteomyelitis. 2. Calcified peripheral vascular disease. Electronically Signed   By: Odessa Fleming M.D.   On: 11/14/2020 05:49    ____________________________________________   PROCEDURES  Procedure(s) performed (including Critical Care):  .Critical Care  Date/Time: 11/14/2020 11:22 AM Performed by: Concha Se, MD Authorized by: Concha Se, MD   Critical care provider statement:    Critical care time (minutes):  45   Critical care was necessary to treat or prevent imminent or life-threatening deterioration of the following conditions:  Sepsis   Critical care was time spent personally by me on the following activities:  Discussions with consultants, evaluation of patient's response to treatment, examination of patient, ordering and performing treatments and interventions, ordering and review of laboratory studies, ordering and review of radiographic studies, pulse oximetry, re-evaluation of patient's condition, obtaining history from patient or surrogate and review of old charts   ____________________________________________   INITIAL IMPRESSION / ASSESSMENT AND PLAN / ED COURSE  Brendan Pruitt was evaluated in Emergency Department on 11/14/2020 for the symptoms described in  the history of present illness. He was evaluated in the context of the global COVID-19 pandemic, which necessitated consideration that the patient might be at risk for infection with the SARS-CoV-2 virus that causes COVID-19. Institutional protocols and algorithms that pertain to the evaluation of patients at risk for COVID-19 are in a state of rapid change based on information released by regulatory bodies including the CDC and federal and state organizations. These policies and algorithms were followed during the patient's care in the ED.     Patient is  a 45 year old who comes in with low-grade temperatures, tachycardia with concern for cellulitis.  Vascularly intact.  X-ray without evidence of osteo-.  Low suspicion for necrotizing fasciitis.  Patient does report some pain in the calf prior to the redness starting to get ultrasound to make sure no evidence of DVT.  Ultrasound negative.  Patient was seen by Dr. Alberteen Spindle who recommends MRI of the foot and potential debridement.  Patient started on vancomycin and ceftriaxone for concern for sepsis.  Lactate is normal and patient does not appear to need any pressors.  We will discussed the hospital team for admission      ____________________________________________   FINAL CLINICAL IMPRESSION(S) / ED DIAGNOSES   Final diagnoses:  Pain  Sepsis, due to unspecified organism, unspecified whether acute organ dysfunction present (HCC)  Cellulitis of right lower extremity      MEDICATIONS GIVEN DURING THIS VISIT:  Medications  vancomycin (VANCOCIN) IVPB 1000 mg/200 mL premix (0 mg Intravenous Stopped 11/14/20 1040)  cefTRIAXone (ROCEPHIN) 2 g in sodium chloride 0.9 % 100 mL IVPB (0 g Intravenous Stopped 11/14/20 0945)  sodium chloride 0.9 % bolus 1,000 mL (0 mLs Intravenous Stopped 11/14/20 1057)     ED Discharge Orders     None        Note:  This document was prepared using Dragon voice recognition software and may include unintentional  dictation errors.    Concha Se, MD 11/14/20 1124

## 2020-11-14 NOTE — Consult Note (Signed)
Pharmacy Antibiotic Note  Mitsuo Budnick is a 45 y.o. male with medical history including diabetes admitted on 11/14/2020 with  diabetic foot infection .  Pharmacy has been consulted for vancomycin dosing. Right foot X-ray with soft tissue swelling and no evidence of osteomyelitis. Patient is also ordered ceftriaxone.   Patient to go for I&D of right foot abscess today  Plan:  Vancomycin 2 g IV LD followed by maintenance regimen of vancomycin 1 g IV q12h --Calculated AUC: 453, Cmin 11.4 --Daily Scr per protocol while on IV vancomycin --Levels at steady state as clinically indicated  Height: 5\' 5"  (165.1 cm) Weight: 96.6 kg (213 lb) IBW/kg (Calculated) : 61.5  Temp (24hrs), Avg:99.5 F (37.5 C), Min:98.9 F (37.2 C), Max:100.1 F (37.8 C)  Recent Labs  Lab 11/14/20 0236  WBC 11.5*  CREATININE 0.77  LATICACIDVEN 1.2    Estimated Creatinine Clearance: 128.5 mL/min (by C-G formula based on SCr of 0.77 mg/dL).    No Known Allergies  Antimicrobials this admission: Ceftriaxone 7/4 >>  Vancomycin 7/4 >>   Dose adjustments this admission: N/A  Microbiology results: 7/4 BCx: pending  Thank you for allowing pharmacy to be a part of this patient's care.  9/4 11/14/2020 12:25 PM

## 2020-11-14 NOTE — ED Notes (Signed)
Dr Niu at bedside 

## 2020-11-14 NOTE — ED Notes (Signed)
Dr. Klein at bedside 

## 2020-11-14 NOTE — ED Notes (Signed)
Pt ambulatory in lobby without difficulty.  

## 2020-11-14 NOTE — Anesthesia Procedure Notes (Signed)
Procedure Name: Intubation Date/Time: 11/14/2020 2:43 PM Performed by: Irving Burton, CRNA Pre-anesthesia Checklist: Patient identified, Patient being monitored, Timeout performed, Emergency Drugs available and Suction available Patient Re-evaluated:Patient Re-evaluated prior to induction Oxygen Delivery Method: Circle system utilized Preoxygenation: Pre-oxygenation with 100% oxygen Induction Type: IV induction Ventilation: Mask ventilation without difficulty Laryngoscope Size: McGraph and 4 Grade View: Grade I Tube type: Oral Tube size: 7.5 mm Number of attempts: 1 Airway Equipment and Method: Stylet Placement Confirmation: ETT inserted through vocal cords under direct vision, positive ETCO2 and breath sounds checked- equal and bilateral Secured at: 23 cm Tube secured with: Tape Dental Injury: Teeth and Oropharynx as per pre-operative assessment  Difficulty Due To: Difficult Airway- due to limited oral opening

## 2020-11-14 NOTE — ED Notes (Signed)
Pt to MRI

## 2020-11-14 NOTE — Consult Note (Signed)
Reason for Consult: Cellulitis with abscess right foot. Referring Physician: [name] is an 45 y.o. male.  HPI: This is a 45 year old male with chronic history of ulcerations on both feet over the last 6 months.  Recently seen outpatient and Cipro added to his antibiotic regimen.  Patient relates worsening of the infection with increased redness and swelling over the last few days.  Has had some fever.  Presented to the emergency department for evaluation.  Past Medical History:  Diagnosis Date   Acute osteomyelitis (HCC) 10/2019   phalanx left foot   Cellulitis and abscess of foot 10/2019   left.  using betadine wet to dry dressings.  treated with multiple antibiotics. 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   Hypertension    Neuromuscular disorder (HCC)    diabetic neuropathy    Past Surgical History:  Procedure Laterality Date   AMPUTATION TOE Left 11/06/2019   Procedure: AMPUTATION TOE IPJ LEFT HALLUX;  Surgeon: Rosetta Posner, DPM;  Location: [facility];  Service: Podiatry;  Laterality: Left;   CATARACT EXTRACTION W/PHACO Right 09/21/2020   Procedure: CATARACT EXTRACTION PHACO AND INTRAOCULAR LENS PLACEMENT (IOC) RIGHT DIABETIC;  Surgeon: Lockie Mola, MD;  Location: [facility];  Service: Ophthalmology;  Laterality: Right;  Diabetic - oral meds cde 9.48 01:48.8 minutes 8.7%   CATARACT EXTRACTION W/PHACO Left 10/12/2020   Procedure: CATARACT EXTRACTION PHACO AND INTRAOCULAR LENS PLACEMENT (IOC) LEFT DIABETIC 8.16 01:37.0;  Surgeon: Lockie Mola, MD;  Location: [facility];  Service: Ophthalmology;  Laterality: Left;  Diabetic - oral meds   LUMBAR DISC SURGERY  2010   no metal   UMBILICAL HERNIA REPAIR  2010   mesh     Family History  Problem Relation Age of Onset   Diabetes Mother    Diabetes Sister     Social History:   reports that he quit smoking about 6 years ago. His smoking use included cigarettes. He has a 1.00 pack-year smoking history. He has never used smokeless tobacco. He reports current alcohol use of about 20.0 standard drinks of alcohol per week. He reports that he does not use drugs.  Allergies: No Known Allergies  Medications: Scheduled:  insulin aspart  0-5 Units Subcutaneous QHS   insulin aspart  0-9 Units Subcutaneous TID WC    Results for orders placed or performed during the hospital encounter of 11/14/20 (from the past 48 hour(s))  Lactic acid, plasma     Status: None   Collection Time: 11/14/20  2:36 AM  Result Value Ref Range   Lactic Acid, Venous 1.2 0.5 - 1.9 mmol/L    Comment: Performed at [facility], [address] [place], [place] [address]  Comprehensive metabolic panel     Status: Abnormal   Collection Time: 11/14/20  2:36 AM  Result Value Ref Range   Sodium 133 (L) 135 - 145 mmol/L   Potassium 3.8 3.5 - 5.1 mmol/L   Chloride 98 98 - 111 mmol/L   CO2 24 22 - 32 mmol/L   Glucose, Bld 241 (H) 70 - 99 mg/dL    Comment: Glucose reference range applies only to samples taken after fasting for at least 8 hours.   BUN 14 6 - 20 mg/dL   Creatinine, Ser 1.65 0.61 - 1.24 mg/dL   Calcium 9.3 8.9 - 53.7 mg/dL   Total  Protein 8.3 (H) 6.5 - 8.1 g/dL   Albumin 3.9 3.5 - 5.0 g/dL   AST 29 15 - 41 U/L   ALT 30 0 - 44 U/L   Alkaline Phosphatase 128 (H) 38 - 126 U/L   Total Bilirubin 1.1 0.3 - 1.2 mg/dL   GFR, Estimated >13>60 >08>60 mL/min    Comment: (NOTE) Calculated using the CKD-EPI Creatinine Equation (2021)    Anion gap 11 5 - 15    Comment: Performed at [facility], [address] [place], [place] [address]  CBC with Differential     Status: Abnormal   Collection Time: 11/14/20  2:36 AM  Result Value Ref Range   WBC 11.5 (H) 4.0 - 10.5 K/uL   RBC 4.22 4.22 - 5.81 MIL/uL   Hemoglobin 14.2 13.0 - 17.0 g/dL   HCT 69.641.7 29.539.0 - 28.452.0 %   MCV 98.8 80.0 -  100.0 fL   MCH 33.6 26.0 - 34.0 pg   MCHC 34.1 30.0 - 36.0 g/dL   RDW 13.213.0 44.011.5 - 10.215.5 %   Platelets 164 150 - 400 K/uL   nRBC 0.0 0.0 - 0.2 %   Neutrophils Relative % 67 %   Neutro Abs 7.7 1.7 - 7.7 K/uL   Lymphocytes Relative 17 %   Lymphs Abs 2.0 0.7 - 4.0 K/uL   Monocytes Relative 13 %   Monocytes Absolute 1.5 (H) 0.1 - 1.0 K/uL   Eosinophils Relative 1 %   Eosinophils Absolute 0.1 0.0 - 0.5 K/uL   Basophils Relative 1 %   Basophils Absolute 0.1 0.0 - 0.1 K/uL   Immature Granulocytes 1 %   Abs Immature Granulocytes 0.10 (H) 0.00 - 0.07 K/uL    Comment: Performed at [facility], [address] [place], [place] [address]  Sedimentation rate     Status: Abnormal   Collection Time: 11/14/20  8:41 AM  Result Value Ref Range   Sed Rate 72 (H) 0 - 15 mm/hr    Comment: Performed at [facility], [address] [place], [place] [address]  Resp Panel by RT-PCR (Flu A&B, Covid) Nasopharyngeal Swab     Status: None   Collection Time: 11/14/20  8:41 AM   Specimen: Nasopharyngeal Swab; Nasopharyngeal(NP) swabs in vial transport medium  Result Value Ref Range   SARS Coronavirus 2 by RT PCR NEGATIVE NEGATIVE    Comment: (NOTE) SARS-CoV-2 target nucleic acids are NOT DETECTED.  The SARS-CoV-2 RNA is generally detectable in upper respiratory specimens during the acute phase of infection. The lowest concentration of SARS-CoV-2 viral copies this assay can detect is 138 copies/mL. A negative result does not preclude SARS-Cov-2 infection and should not be used as the sole basis for treatment or other patient management decisions. A negative result may occur with  improper specimen collection/handling, submission of specimen other than nasopharyngeal swab, presence of viral mutation(s) within the areas targeted by this assay, and inadequate number of viral copies(<138 copies/mL). A negative result must be combined with clinical observations, patient history, and  epidemiological information. The expected result is Negative.  Fact Sheet for Patients:  BloggerCourse.comhttps://www.fda.gov/media/152166/download  Fact Sheet for Healthcare Providers:  SeriousBroker.ithttps://www.fda.gov/media/152162/download  This test is no t yet approved or cleared by the [place] FDA and  has been authorized for detection and/or diagnosis of SARS-CoV-2 by FDA under an Emergency Use Authorization (EUA). This EUA will remain  in effect (meaning this test can be used) for the duration of the COVID-19 declaration under Section  564(b)(1) of the Act, 21 U.S.C.section 360bbb-3(b)(1), unless the authorization is terminated  or revoked sooner.       Influenza A by PCR NEGATIVE NEGATIVE   Influenza B by PCR NEGATIVE NEGATIVE    Comment: (NOTE) The Xpert Xpress SARS-CoV-2/FLU/RSV plus assay is intended as an aid in the diagnosis of influenza from Nasopharyngeal swab specimens and should not be used as a sole basis for treatment. Nasal washings and aspirates are unacceptable for Xpert Xpress SARS-CoV-2/FLU/RSV testing.  Fact Sheet for Patients: BloggerCourse.com  Fact Sheet for Healthcare Providers: SeriousBroker.it  This test is not yet approved or cleared by the [place] FDA and has been authorized for detection and/or diagnosis of SARS-CoV-2 by FDA under an Emergency Use Authorization (EUA). This EUA will remain in effect (meaning this test can be used) for the duration of the COVID-19 declaration under Section 564(b)(1) of the Act, 21 U.S.C. section 360bbb-3(b)(1), unless the authorization is terminated or revoked.  Performed at [facility], [address], [place], [place] [address]     US Venous Img Lower Unilateral Right  Result Date: 11/14/2020 CLINICAL DATA:  Right leg pain for 4 days. EXAM: RIGHT LOWER EXTREMITY VENOUS DOPPLER ULTRASOUND TECHNIQUE: Gray-scale sonography with graded compression, as well as color  Doppler and duplex ultrasound were performed to evaluate the lower extremity deep venous systems from the level of the common femoral vein and including the common femoral, femoral, profunda femoral, popliteal and calf veins including the posterior tibial, peroneal and gastrocnemius veins when visible. The superficial great saphenous vein was also interrogated. Spectral Doppler was utilized to evaluate flow at rest and with distal augmentation maneuvers in the common femoral, femoral and popliteal veins. COMPARISON:  None. FINDINGS: Contralateral Common Femoral Vein: Respiratory phasicity is normal and symmetric with the symptomatic side. No evidence of thrombus. Normal compressibility. Common Femoral Vein: No evidence of thrombus. Normal compressibility, respiratory phasicity and response to augmentation. Saphenofemoral Junction: No evidence of thrombus. Normal compressibility and flow on color Doppler imaging. Profunda Femoral Vein: No evidence of thrombus. Normal compressibility and flow on color Doppler imaging. Femoral Vein: No evidence of thrombus. Normal compressibility, respiratory phasicity and response to augmentation. Popliteal Vein: No evidence of thrombus. Normal compressibility, respiratory phasicity and response to augmentation. Calf Veins: No evidence of thrombus. Normal compressibility and flow on color Doppler imaging. Superficial Great Saphenous Vein: No evidence of thrombus. Normal compressibility. Other Findings: Subcutaneous edema in the right calf. Normal appearing lymph nodes in the right groin. IMPRESSION: Negative for deep venous thrombosis in right lower extremity. Electronically Signed   By: Richarda Overlie M.D.   On: 11/14/2020 11:19   DG Foot Complete Right  Result Date: 11/14/2020 CLINICAL DATA:  45 year old male with diabetic foot wounds. Purulent drainage and bleeding. EXAM: RIGHT FOOT COMPLETE - 3+ VIEW COMPARISON:  None. FINDINGS: Calcified peripheral vascular disease. Normal  background bone mineralization. Dystrophic calcifications at the anterior distal tibia. No soft tissue gas. Generalized distal soft tissue swelling in the right foot. No fracture or dislocation. No cortical osteolysis identified. IMPRESSION: 1. Soft tissue swelling with no radiographic evidence of osteomyelitis. 2. Calcified peripheral vascular disease. Electronically Signed   By: Odessa Fleming M.D.   On: 11/14/2020 05:49    Review of Systems  Constitutional:  Positive for fever. Negative for chills.  HENT:  Negative for sinus pain and sore throat.   Respiratory:  Negative for cough and shortness of breath.   Cardiovascular:  Negative for chest pain and palpitations.  Gastrointestinal:  Negative for nausea and vomiting.  Genitourinary:  Negative for frequency and urgency.  Musculoskeletal:        Recent increased pain in his right foot.  Skin:        Relates chronic ulcerations on both feet.  Recent increased swelling and redness in his right foot.  Placed on Cipro last week and it has gotten worse in the last few days  Neurological:        Significant neuropathy associated with his diabetes.  Psychiatric/Behavioral:  Negative for confusion. The patient is not nervous/anxious.   Blood pressure 110/70, pulse 99, temperature 100.1 F (37.8 C), temperature source Oral, resp. rate 15, height 5\' 5"  (1.651 m), weight 96.6 kg, SpO2 96 %. Physical Exam Cardiovascular:     Comments: Pedal pulses are palpable but mildly diminished. Musculoskeletal:     Comments: Previous amputation of the left hallux.  Adequate range of motion.  Muscle testing deferred.  Skin:    Comments: Bilateral plantar granular ulcerations beneath the fifth metatarsal.  Fibrotic necrotic eschar over the dorsal lateral aspect of the right fifth metatarsal with significant erythema and edema's extending to the area of the lateral ankle.  Expressible purulence is noted from the wound from the dorsal aspect of the fifth metatarsal.   Neurological:     Comments: Loss of protective threshold with a monofilament wire with significant neuropathy.       Assessment/Plan: Assessment: 1.  Ulceration with cellulitis and abscess right foot. 2.  Diabetes with associated neuropathy.  Plan: Discussed with the patient the need for I&D of the abscess to clear up the infection.  Discussed possible risks and complications including inability to heal due to continued infection or his diabetes which is not well controlled or possible vascular issues.  No guarantees could be given as to his healing potential.  Obtain consent for I&D abscess right foot.  Patient is n.p.o.  Stat MRI ordered by emergency room physician and pending results we will plan for surgery for emergent I&D early this afternoon.  11/14/2020, 11:23 AM

## 2020-11-14 NOTE — ED Triage Notes (Addendum)
Pt is diabetic and has bilateral foot wounds. Pt with right lateral foot wound with purulent drainage and bleeding. Pt states is having pain up his right leg. Pt was seen by his pmd last week and had xrays and given an antibiotic. Pt states he has had a fever at home.

## 2020-11-14 NOTE — ED Notes (Signed)
Pt to ED for diabetic foot wounds since 6.5 months. Both feet are wrapped in ace bandages. Pt states he has wounds on each foot, lateral aspect. R foot wound was debrided last week by Dr Excell Seltzer, podiatrist. Pt states he also has 2 plantar wounds on each foot and is missing great toe on L foot. States had gangrene on plantar wounds about 5 months ago but was treated and no longer has gangrene. Has diabetic neuropathy in legs.

## 2020-11-14 NOTE — Op Note (Signed)
Date of operation: 11/14/2020.  Surgeon: Ricci Barker D.P.M.  Preoperative diagnosis: Osteomyelitis right fifth metatarsal with abscess foot.  Postoperative diagnosis: Same.  Procedure: I&D right foot with excision of fifth metatarsal phalangeal joint.  Anesthesia: General endotracheal.  Hemostasis: Pneumatic tourniquet right ankle 250 mmHg.  Estimated blood loss: 10 cc.  Cultures: Abscess soft tissue right foot, bone fifth metatarsal head.  Pathology: Right fifth metatarsal phalangeal joint.  Injectables: 9 cc 0.5% Marcaine plain.  Implants: Stimulan rapid cure antibiotic beads impregnated with vancomycin.  Complications: None apparent.  Operative indications: This is a 45 year old male with chronic ulcerations on both feet over the last 6 months.  More recently has developed ulceration on the side of his right foot.  Worsening recently.  Placed on antibiotics outpatient which he failed and presented to the emergency department today with significant infection.  Decision was made for I&D with removal of the bone after positive osteomyelitis with MRI.  Operative procedure: Patient was taken to the operating room and placed on the table in the supine position.  Following satisfactory general anesthesia pneumatic tourniquet was applied at the level of the right ankle and the foot was prepped and draped in usual sterile fashion.  Foot was elevation only exsanguinated and the tourniquet inflated to 250 mmHg.  Attention was directed to the left foot where an incision was made through the ulceration on the lateral aspect of the foot along the fifth metatarsal shaft down to the base of the fifth toe.  Incision was carried sharply down to bone and dissection carried out around the joint which was then removed in toto using a sagittal saw.  Culture of the bone was taken as well as soft tissue culture of the dorsal abscess.  There was noted to be some significant infection and necrotic tissue  extending dorsally beneath the skin.  This was excisionally debrided using a rongeur and then all areas of the wound were debrided with a Versajet debrider on a setting of 3.  The wound was then thoroughly irrigated with 3 L of saline under pulsed irrigation.  Incision was partially closed using 3-0 nylon simple interrupted sutures and the wound packed with Stimulan rapid cure antibiotic beads impregnated with vancomycin.  Remainder of the incision closed using 3-0 nylon simple interrupted sutures.  9 cc of 0.5% Marcaine plain injected around the surgical site followed by Xeroform to the incision area a Betadine gauze to plantar ulceration and bulky gauze ABD and Kerlix bandage.  Tourniquet was released.  Second ABD and Kerlix and Ace wrap then applied to the right lower extremity.  Patient was awakened and transported to the PACU with vital signs stable and in good condition.

## 2020-11-14 NOTE — Progress Notes (Signed)
Elink following for code sepsis 

## 2020-11-14 NOTE — H&P (View-Only) (Signed)
Reason for Consult: Cellulitis with abscess right foot. Referring Physician: Burke Terry is an 45 y.o. male.  HPI: This is a 45 year old male with chronic history of ulcerations on both feet over the last 6 months.  Recently seen outpatient and Cipro added to his antibiotic regimen.  Patient relates worsening of the infection with increased redness and swelling over the last few days.  Has had some fever.  Presented to the emergency department for evaluation.  Past Medical History:  Diagnosis Date   Acute osteomyelitis (HCC) 10/2019   phalanx left foot   Cellulitis and abscess of foot 10/2019   left.  using betadine wet to dry dressings.  treated with multiple antibiotics. 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   Hypertension    Neuromuscular disorder (HCC)    diabetic neuropathy    Past Surgical History:  Procedure Laterality Date   AMPUTATION TOE Left 11/06/2019   Procedure: AMPUTATION TOE IPJ LEFT HALLUX;  Surgeon: Rosetta Posner, DPM;  Location: [facility] OR[facility];  Service: Podiatry;  Laterality: Left;   CATARACT EXTRACTION W/PHACO Right 09/21/2020   Procedure: CATARACT EXTRACTION PHACO AND INTRAOCULAR LENS PLACEMENT (IOC) RIGHT DIABETIC;  Surgeon: Lockie Mola, MD;  Location: [facility];  Service: Ophthalmology;  Laterality: Right;  Diabetic - oral meds cde 9.48 01:48.8 minutes 8.7%   CATARACT EXTRACTION W/PHACO Left 10/12/2020   Procedure: CATARACT EXTRACTION PHACO AND INTRAOCULAR LENS PLACEMENT (IOC) LEFT DIABETIC 8.16 01:37.0;  Surgeon: Lockie Mola, MD;  Location: [facility];  Service: Ophthalmology;  Laterality: Left;  Diabetic - oral meds   LUMBAR DISC SURGERY  2010   no metal   UMBILICAL HERNIA REPAIR  2010   mesh     Family History  Problem Relation Age of Onset   Diabetes Mother    Diabetes Sister     Social History:   reports that he quit smoking about 6 years ago. His smoking use included cigarettes. He has a 1.00 pack-year smoking history. He has never used smokeless tobacco. He reports current alcohol use of about 20.0 standard drinks of alcohol per week. He reports that he does not use drugs.  Allergies: No Known Allergies  Medications: Scheduled:  insulin aspart  0-5 Units Subcutaneous QHS   insulin aspart  0-9 Units Subcutaneous TID WC    Results for orders placed or performed during the hospital encounter of 11/14/20 (from the past 48 hour(s))  Lactic acid, plasma     Status: None   Collection Time: 11/14/20  2:36 AM  Result Value Ref Range   Lactic Acid, Venous 1.2 0.5 - 1.9 mmol/L    Comment: Performed at [facility], [address] [place], [place] [address]  Comprehensive metabolic panel     Status: Abnormal   Collection Time: 11/14/20  2:36 AM  Result Value Ref Range   Sodium 133 (L) 135 - 145 mmol/L   Potassium 3.8 3.5 - 5.1 mmol/L   Chloride 98 98 - 111 mmol/L   CO2 24 22 - 32 mmol/L   Glucose, Bld 241 (H) 70 - 99 mg/dL    Comment: Glucose reference range applies only to samples taken after fasting for at least 8 hours.   BUN 14 6 - 20 mg/dL   Creatinine, Ser 1.65 0.61 - 1.24 mg/dL   Calcium 9.3 8.9 - 53.7 mg/dL   Total  Protein 8.3 (H) 6.5 - 8.1 g/dL   Albumin 3.9 3.5 - 5.0 g/dL   AST 29 15 - 41 U/L   ALT 30 0 - 44 U/L   Alkaline Phosphatase 128 (H) 38 - 126 U/L   Total Bilirubin 1.1 0.3 - 1.2 mg/dL   GFR, Estimated >60 >60 mL/min    Comment: (NOTE) Calculated using the CKD-EPI Creatinine Equation (2021)    Anion gap 11 5 - 15    Comment: Performed at [facility], [address] [place], [place] [address]  CBC with Differential     Status: Abnormal   Collection Time: 11/14/20  2:36 AM  Result Value Ref Range   WBC 11.5 (H) 4.0 - 10.5 K/uL   RBC 4.22 4.22 - 5.81 MIL/uL   Hemoglobin 14.2 13.0 - 17.0 g/dL   HCT 41.7 39.0 - 52.0 %   MCV 98.8 80.0 -  100.0 fL   MCH 33.6 26.0 - 34.0 pg   MCHC 34.1 30.0 - 36.0 g/dL   RDW 13.0 11.5 - 15.5 %   Platelets 164 150 - 400 K/uL   nRBC 0.0 0.0 - 0.2 %   Neutrophils Relative % 67 %   Neutro Abs 7.7 1.7 - 7.7 K/uL   Lymphocytes Relative 17 %   Lymphs Abs 2.0 0.7 - 4.0 K/uL   Monocytes Relative 13 %   Monocytes Absolute 1.5 (H) 0.1 - 1.0 K/uL   Eosinophils Relative 1 %   Eosinophils Absolute 0.1 0.0 - 0.5 K/uL   Basophils Relative 1 %   Basophils Absolute 0.1 0.0 - 0.1 K/uL   Immature Granulocytes 1 %   Abs Immature Granulocytes 0.10 (H) 0.00 - 0.07 K/uL    Comment: Performed at [facility], [address] [place], [place] [address]  Sedimentation rate     Status: Abnormal   Collection Time: 11/14/20  8:41 AM  Result Value Ref Range   Sed Rate 72 (H) 0 - 15 mm/hr    Comment: Performed at [facility], [address] [place], [place] [address]  Resp Panel by RT-PCR (Flu A&B, Covid) Nasopharyngeal Swab     Status: None   Collection Time: 11/14/20  8:41 AM   Specimen: Nasopharyngeal Swab; Nasopharyngeal(NP) swabs in vial transport medium  Result Value Ref Range   SARS Coronavirus 2 by RT PCR NEGATIVE NEGATIVE    Comment: (NOTE) SARS-CoV-2 target nucleic acids are NOT DETECTED.  The SARS-CoV-2 RNA is generally detectable in upper respiratory specimens during the acute phase of infection. The lowest concentration of SARS-CoV-2 viral copies this assay can detect is 138 copies/mL. A negative result does not preclude SARS-Cov-2 infection and should not be used as the sole basis for treatment or other patient management decisions. A negative result may occur with  improper specimen collection/handling, submission of specimen other than nasopharyngeal swab, presence of viral mutation(s) within the areas targeted by this assay, and inadequate number of viral copies(<138 copies/mL). A negative result must be combined with clinical observations, patient history, and  epidemiological information. The expected result is Negative.  Fact Sheet for Patients:  https://www.fda.gov/media/152166/download  Fact Sheet for Healthcare Providers:  https://www.fda.gov/media/152162/download  This test is no t yet approved or cleared by the [place] FDA and  has been authorized for detection and/or diagnosis of SARS-CoV-2 by FDA under an Emergency Use Authorization (EUA). This EUA will remain  in effect (meaning this test can be used) for the duration of the COVID-19 declaration under Section   564(b)(1) of the Act, 21 U.S.C.section 360bbb-3(b)(1), unless the authorization is terminated  or revoked sooner.       Influenza A by PCR NEGATIVE NEGATIVE   Influenza B by PCR NEGATIVE NEGATIVE    Comment: (NOTE) The Xpert Xpress SARS-CoV-2/FLU/RSV plus assay is intended as an aid in the diagnosis of influenza from Nasopharyngeal swab specimens and should not be used as a sole basis for treatment. Nasal washings and aspirates are unacceptable for Xpert Xpress SARS-CoV-2/FLU/RSV testing.  Fact Sheet for Patients: BloggerCourse.com  Fact Sheet for Healthcare Providers: SeriousBroker.it  This test is not yet approved or cleared by the [place] FDA and has been authorized for detection and/or diagnosis of SARS-CoV-2 by FDA under an Emergency Use Authorization (EUA). This EUA will remain in effect (meaning this test can be used) for the duration of the COVID-19 declaration under Section 564(b)(1) of the Act, 21 U.S.C. section 360bbb-3(b)(1), unless the authorization is terminated or revoked.  Performed at [facility], [address], [place], [place] [address]     US Venous Img Lower Unilateral Right  Result Date: 11/14/2020 CLINICAL DATA:  Right leg pain for 4 days. EXAM: RIGHT LOWER EXTREMITY VENOUS DOPPLER ULTRASOUND TECHNIQUE: Gray-scale sonography with graded compression, as well as color  Doppler and duplex ultrasound were performed to evaluate the lower extremity deep venous systems from the level of the common femoral vein and including the common femoral, femoral, profunda femoral, popliteal and calf veins including the posterior tibial, peroneal and gastrocnemius veins when visible. The superficial great saphenous vein was also interrogated. Spectral Doppler was utilized to evaluate flow at rest and with distal augmentation maneuvers in the common femoral, femoral and popliteal veins. COMPARISON:  None. FINDINGS: Contralateral Common Femoral Vein: Respiratory phasicity is normal and symmetric with the symptomatic side. No evidence of thrombus. Normal compressibility. Common Femoral Vein: No evidence of thrombus. Normal compressibility, respiratory phasicity and response to augmentation. Saphenofemoral Junction: No evidence of thrombus. Normal compressibility and flow on color Doppler imaging. Profunda Femoral Vein: No evidence of thrombus. Normal compressibility and flow on color Doppler imaging. Femoral Vein: No evidence of thrombus. Normal compressibility, respiratory phasicity and response to augmentation. Popliteal Vein: No evidence of thrombus. Normal compressibility, respiratory phasicity and response to augmentation. Calf Veins: No evidence of thrombus. Normal compressibility and flow on color Doppler imaging. Superficial Great Saphenous Vein: No evidence of thrombus. Normal compressibility. Other Findings: Subcutaneous edema in the right calf. Normal appearing lymph nodes in the right groin. IMPRESSION: Negative for deep venous thrombosis in right lower extremity. Electronically Signed   By: Richarda Overlie M.D.   On: 11/14/2020 11:19   DG Foot Complete Right  Result Date: 11/14/2020 CLINICAL DATA:  45 year old male with diabetic foot wounds. Purulent drainage and bleeding. EXAM: RIGHT FOOT COMPLETE - 3+ VIEW COMPARISON:  None. FINDINGS: Calcified peripheral vascular disease. Normal  background bone mineralization. Dystrophic calcifications at the anterior distal tibia. No soft tissue gas. Generalized distal soft tissue swelling in the right foot. No fracture or dislocation. No cortical osteolysis identified. IMPRESSION: 1. Soft tissue swelling with no radiographic evidence of osteomyelitis. 2. Calcified peripheral vascular disease. Electronically Signed   By: Odessa Fleming M.D.   On: 11/14/2020 05:49    Review of Systems  Constitutional:  Positive for fever. Negative for chills.  HENT:  Negative for sinus pain and sore throat.   Respiratory:  Negative for cough and shortness of breath.   Cardiovascular:  Negative for chest pain and palpitations.  Gastrointestinal:  Negative for nausea and vomiting.  Genitourinary:  Negative for frequency and urgency.  Musculoskeletal:        Recent increased pain in his right foot.  Skin:        Relates chronic ulcerations on both feet.  Recent increased swelling and redness in his right foot.  Placed on Cipro last week and it has gotten worse in the last few days  Neurological:        Significant neuropathy associated with his diabetes.  Psychiatric/Behavioral:  Negative for confusion. The patient is not nervous/anxious.   Blood pressure 110/70, pulse 99, temperature 100.1 F (37.8 C), temperature source Oral, resp. rate 15, height 5\' 5"  (1.651 m), weight 96.6 kg, SpO2 96 %. Physical Exam Cardiovascular:     Comments: Pedal pulses are palpable but mildly diminished. Musculoskeletal:     Comments: Previous amputation of the left hallux.  Adequate range of motion.  Muscle testing deferred.  Skin:    Comments: Bilateral plantar granular ulcerations beneath the fifth metatarsal.  Fibrotic necrotic eschar over the dorsal lateral aspect of the right fifth metatarsal with significant erythema and edema's extending to the area of the lateral ankle.  Expressible purulence is noted from the wound from the dorsal aspect of the fifth metatarsal.   Neurological:     Comments: Loss of protective threshold with a monofilament wire with significant neuropathy.       Assessment/Plan: Assessment: 1.  Ulceration with cellulitis and abscess right foot. 2.  Diabetes with associated neuropathy.  Plan: Discussed with the patient the need for I&D of the abscess to clear up the infection.  Discussed possible risks and complications including inability to heal due to continued infection or his diabetes which is not well controlled or possible vascular issues.  No guarantees could be given as to his healing potential.  Obtain consent for I&D abscess right foot.  Patient is n.p.o.  Stat MRI ordered by emergency room physician and pending results we will plan for surgery for emergent I&D early this afternoon.  11/14/2020, 11:23 AM

## 2020-11-14 NOTE — ED Notes (Signed)
Pt taken to ultrasound

## 2020-11-14 NOTE — Transfer of Care (Signed)
Immediate Anesthesia Transfer of Care Note  Patient: Brendan Pruitt  Procedure(s) Performed: IRRIGATION AND DEBRIDEMENT EXTREMITY (Right: Foot)  Patient Location: PACU  Anesthesia Type:General  Level of Consciousness: awake  Airway & Oxygen Therapy: Patient Spontanous Breathing and Patient connected to face mask oxygen  Post-op Assessment: Report given to RN and Post -op Vital signs reviewed and stable  Post vital signs: stable  Last Vitals:  Vitals Value Taken Time  BP 112/79 11/14/20 1542  Temp    Pulse 87 11/14/20 1543  Resp 21 11/14/20 1543  SpO2 97 % 11/14/20 1543  Vitals shown include unvalidated device data.  Last Pain:  Vitals:   11/14/20 1400  TempSrc: Oral  PainSc: 0-No pain         Complications: No notable events documented.

## 2020-11-14 NOTE — Interval H&P Note (Signed)
History and Physical Interval Note:  11/14/2020 2:11 PM  Brendan Pruitt  has presented today for surgery, with the diagnosis of infection right foot.  The various methods of treatment have been discussed with the patient and family. After consideration of risks, benefits and other options for treatment, the patient has consented to  Procedure(s): IRRIGATION AND DEBRIDEMENT EXTREMITY (Right) as a surgical intervention.  The patient's history has been reviewed, patient examined, no change in status, stable for surgery.  I have reviewed the patient's chart and labs.  Questions were answered to the patient's satisfaction.     Ricci Barker

## 2020-11-14 NOTE — Anesthesia Postprocedure Evaluation (Signed)
Anesthesia Post Note  Patient: Brendan Pruitt  Procedure(s) Performed: IRRIGATION AND DEBRIDEMENT EXTREMITY (Right: Foot)  Anesthesia Type: General Anesthetic complications: no   No notable events documented.   Last Vitals:  Vitals:   11/14/20 1545 11/14/20 1600  BP: 117/75 129/83  Pulse: 90 97  Resp: 19 20  Temp:    SpO2: 98% 95%    Last Pain:  Vitals:   11/14/20 1542  TempSrc:   PainSc: Asleep                 Johny Drilling

## 2020-11-14 NOTE — Progress Notes (Signed)
CODE SEPSIS - PHARMACY COMMUNICATION  **Broad Spectrum Antibiotics should be administered within 1 hour of Sepsis diagnosis**  Time Code Sepsis Called/Page Received: 0908  Antibiotics Ordered: ceftriaxone + vancomycin  Time of 1st antibiotic administration: 0915  Additional action taken by pharmacy: N/A  Brendan Pruitt 11/14/2020  9:09 AM

## 2020-11-14 NOTE — ED Notes (Signed)
ONE SET OF BLOOD CULTURES SENT TO LAB 

## 2020-11-14 NOTE — ED Notes (Signed)
cbg [id number] 

## 2020-11-14 NOTE — Consult Note (Signed)
PHARMACY -  BRIEF ANTIBIOTIC NOTE   Pharmacy has received consult(s) for vancomycin from an ED provider. Patient is also ordered ceftriaxone. The patient's profile has been reviewed for ht/wt/allergies/indication/available labs.    One time order(s) placed for  --Vancomycin 1 g IV  Further antibiotics/pharmacy consults should be ordered by admitting physician if indicated.                       Thank you, Tressie Ellis 11/14/2020  9:08 AM

## 2020-11-14 NOTE — H&P (Signed)
History and Physical    Brendan Pruitt PQZ:[id number] DOB: 09/02/1975 DOA: 11/14/2020  Referring MD/NP/PA:   PCP: Tracie Harrier, MD   Patient coming from:  The patient is coming from home.  At baseline, pt is independent for most of ADL.        Chief Complaint: foot ulcer   HPI: Brendan Pruitt is a 45 y.o. male with medical history significant of hypertension, hyperlipidemia, diabetes mellitus, s/p of total amputation, tobacco abuse, alcohol abuse in remission (patient states that he stopped drinking alcohol several months ago), who presents with foot ulcer.  Patient has chronic ulceration on both feet for more than 6 months.  Patient has been seen by podiatrist, Dr. Luana Shu.  Patient was started on ciprofloxacin on 6/30 without improvement.  Patient states that he has worsening swelling and redness and burning pain in right foot in the past several days.  He also has mild fever and chills. The right foot ulcer has pus drainage. Patient denies chest pain, cough, shortness breath.  No nausea, vomiting, diarrhea or abdominal pain.  No symptoms of UTI.  ED Course: pt was found to have WBC 11.5, lactic acid 1.2, negative COVID PCR, ESR 72, electrolytes renal function okay, temperature 100.1, blood pressure 110/70, heart rate 105, RR 21, oxygen saturation 96% on room air.  X-ray of right foot is negative for osteomyelitis.  Lower extremity venous Doppler of left lower legs negative for DVT.  Patient is admitted to [facility] bed as inpatient.  Dr. Cleda Mccreedy of podiatry is consulted.  Review of Systems:   General: no fevers, chills, no body weight gain, has fatigue HEENT: no blurry vision, hearing changes or sore throat Respiratory: no dyspnea, coughing, wheezing CV: no chest pain, no palpitations GI: no nausea, vomiting, abdominal pain, diarrhea, constipation GU: no dysuria, burning on urination, increased urinary frequency, hematuria  Ext: has ulcers in both foot, has erythema, swelling and burning  pain, pus drainage in the right foot ulcer Neuro: no unilateral weakness, numbness, or tingling, no vision change or hearing loss Skin: no rash, no skin tear. MSK: No muscle spasm, no deformity, no limitation of range of movement in spin Heme: No easy bruising.  Travel history: No recent long distant travel.  Allergy: No Known Allergies  Past Medical History:  Diagnosis Date   Acute osteomyelitis ([place]) 10/2019   phalanx left foot   Cellulitis and abscess of foot 10/2019   left.  using betadine wet to dry dressings.  treated with multiple antibiotics. 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   left toe   Hypertension    Neuromuscular disorder ([place])    diabetic neuropathy    Past Surgical History:  Procedure Laterality Date   AMPUTATION TOE Left 11/06/2019   Procedure: AMPUTATION TOE IPJ LEFT HALLUX;  Surgeon: Caroline More, DPM;  Location: [facility];  Service: Podiatry;  Laterality: Left;   CATARACT EXTRACTION W/PHACO Right 09/21/2020   Procedure: CATARACT EXTRACTION PHACO AND INTRAOCULAR LENS PLACEMENT ([place]) RIGHT DIABETIC;  Surgeon: Leandrew Koyanagi, MD;  Location: [facility];  Service: Ophthalmology;  Laterality: Right;  Diabetic - oral meds cde 9.48 01:48.8 minutes 8.7%   CATARACT EXTRACTION W/PHACO Left 10/12/2020   Procedure: CATARACT EXTRACTION PHACO AND INTRAOCULAR LENS PLACEMENT ([facility]) LEFT DIABETIC 8.16 01:37.0;  Surgeon: Leandrew Koyanagi, MD;  Location: [facility];  Service: Ophthalmology;  Laterality: Left;  Diabetic - oral meds  LUMBAR DISC SURGERY  2010   no metal   UMBILICAL HERNIA REPAIR  2010   mesh     Social History:  reports that he quit smoking about 6 years ago. His smoking use included cigarettes. He has a 1.00 pack-year smoking history. He has never used smokeless tobacco. He reports current alcohol use of about 20.0 standard drinks  of alcohol per week. He reports that he does not use drugs.  Family History:  Family History  Problem Relation Age of Onset   Diabetes Mother    Diabetes Sister      Prior to Admission medications   Medication Sig Start Date End Date Taking? Authorizing Provider  dapagliflozin propanediol (FARXIGA) 10 MG TABS tablet Take 10 mg by mouth daily.    [provider]  gabapentin (NEURONTIN) 100 MG capsule Take 300 mg by mouth 2 (two) times daily. 10/23/19   [provider]  glipiZIDE (GLUCOTROL XL) 10 MG 24 hr tablet Take 10 mg by mouth 2 (two) times daily. 10/17/19   [provider]  lactulose (CHRONULAC) 10 GM/15ML solution Take 30 mLs (20 g total) by mouth daily as needed for mild constipation. 11/13/19   Paulette Blanch, MD  losartan (COZAAR) 25 MG tablet Take 25 mg by mouth daily.    [provider]  lubiprostone (AMITIZA) 24 MCG capsule Take 1 capsule (24 mcg total) by mouth 2 (two) times daily with a meal. 11/15/19   Delman Kitten, MD  metFORMIN (GLUCOPHAGE) 1000 MG tablet Take 1,000 mg by mouth 2 (two) times daily with a meal.  08/05/15   [provider]  Multiple Vitamin (ONE-A-DAY MENS PO) Take by mouth daily.    [provider]  Omega-3 Fatty Acids (OMEGA 3 PO) Take 1,080 mg by mouth in the morning and at bedtime.    [provider]  OZEMPIC, 1 MG/DOSE, 4 MG/3ML SOPN Inject 1 mg into the skin every Sunday.  10/23/19   [provider]  povidone-Iodine (BETADINE) 5 % SOLN topical solution Apply 1 application topically as needed for wound care.    [provider]    Physical Exam: Vitals:   11/14/20 0557 11/14/20 0828 11/14/20 0938 11/14/20 1213  BP: 118/80 124/79 110/70 108/71  Pulse: (!) 105 (!) 102 99 96  Resp: 18 (!) 21 15 15   Temp:  100.1 F (37.8 C)    TempSrc:  Oral    SpO2: 98% 97% 96% 95%  Weight:      Height:       General: Not in acute distress HEENT:       Eyes: PERRL, EOMI, no scleral icterus.        ENT: No discharge from the ears and nose, no pharynx injection, no tonsillar enlargement.        Neck: No JVD, no bruit, no mass felt. Heme: No neck lymph node enlargement. Cardiac: S1/S2, RRR, No murmurs, No gallops or rubs. Respiratory: No rales, wheezing, rhonchi or rubs. GI: Soft, nondistended, nontender, no rebound pain, no organomegaly, BS present. GU: No hematuria Ext: s/p of left greater toe amputation.  Has ulcers on both foot.  Has erythema, burning pain, swelling and pus drainage in right foot ulcer.           Musculoskeletal: No joint deformities, No joint redness or warmth, no limitation of ROM in spin. Skin: No rashes.  Neuro: Alert, oriented X3, cranial nerves II-XII grossly intact, moves all extremities normally.  Psych: Patient is not psychotic,  no suicidal or hemocidal ideation.  Labs on Admission: I have personally reviewed following labs and imaging studies  CBC: Recent Labs  Lab 11/14/20 0236  WBC 11.5*  NEUTROABS 7.7  HGB 14.2  HCT 41.7  MCV 98.8  PLT 235   Basic Metabolic Panel: Recent Labs  Lab 11/14/20 0236  NA 133*  K 3.8  CL 98  CO2 24  GLUCOSE 241*  BUN 14  CREATININE 0.77  CALCIUM 9.3   GFR: Estimated Creatinine Clearance: 128.5 mL/min (by C-G formula based on SCr of 0.77 mg/dL). Liver Function Tests: Recent Labs  Lab 11/14/20 0236  AST 29  ALT 30  ALKPHOS 128*  BILITOT 1.1  PROT 8.3*  ALBUMIN 3.9   No results for input(s): LIPASE, AMYLASE in the last 168 hours. No results for input(s): AMMONIA in the last 168 hours. Coagulation Profile: Recent Labs  Lab 11/14/20 1230  INR 1.1   Cardiac Enzymes: No results for input(s): CKTOTAL, CKMB, CKMBINDEX, TROPONINI in the last 168 hours. BNP (last 3 results) No results for input(s): PROBNP in the last 8760 hours. HbA1C: No results for input(s): HGBA1C in the last 72 hours. CBG: Recent Labs  Lab 11/14/20 1157  GLUCAP 141*   Lipid Profile: No results for input(s):  CHOL, HDL, LDLCALC, TRIG, CHOLHDL, LDLDIRECT in the last 72 hours. Thyroid Function Tests: No results for input(s): TSH, T4TOTAL, FREET4, T3FREE, THYROIDAB in the last 72 hours. Anemia Panel: No results for input(s): VITAMINB12, FOLATE, FERRITIN, TIBC, IRON, RETICCTPCT in the last 72 hours. Urine analysis:    Component Value Date/Time   COLORURINE YELLOW (A) 11/15/2019 0730   APPEARANCEUR CLEAR (A) 11/15/2019 0730   LABSPEC 1.020 11/15/2019 0730   PHURINE 7.0 11/15/2019 0730   GLUCOSEU NEGATIVE 11/15/2019 0730   HGBUR NEGATIVE 11/15/2019 0730   BILIRUBINUR NEGATIVE 11/15/2019 0730   KETONESUR NEGATIVE 11/15/2019 0730   PROTEINUR NEGATIVE 11/15/2019 0730   NITRITE NEGATIVE 11/15/2019 0730   LEUKOCYTESUR NEGATIVE 11/15/2019 0730   Sepsis Labs: @LABRCNTIP (procalcitonin:4,lacticidven:4) ) Recent Results (from the past 240 hour(s))  Resp Panel by RT-PCR (Flu A&B, Covid) Nasopharyngeal Swab     Status: None   Collection Time: 11/14/20  8:41 AM   Specimen: Nasopharyngeal Swab; Nasopharyngeal(NP) swabs in vial transport medium  Result Value Ref Range Status   SARS Coronavirus 2 by RT PCR NEGATIVE NEGATIVE Final    Comment: (NOTE) SARS-CoV-2 target nucleic acids are NOT DETECTED.  The SARS-CoV-2 RNA is generally detectable in upper respiratory specimens during the acute phase of infection. The lowest concentration of SARS-CoV-2 viral copies this assay can detect is 138 copies/mL. A negative result does not preclude SARS-Cov-2 infection and should not be used as the sole basis for treatment or other patient management decisions. A negative result may occur with  improper specimen collection/handling, submission of specimen other than nasopharyngeal swab, presence of viral mutation(s) within the areas targeted by this assay, and inadequate number of viral copies(<138 copies/mL). A negative result must be combined with clinical observations, patient history, and  epidemiological information. The expected result is Negative.  Fact Sheet for Patients:  EntrepreneurPulse.com.au  Fact Sheet for Healthcare Providers:  IncredibleEmployment.be  This test is no t yet approved or cleared by the [place] FDA and  has been authorized for detection and/or diagnosis of SARS-CoV-2 by FDA under an Emergency Use Authorization (EUA). This EUA will remain  in effect (meaning this test can be used) for the duration of the COVID-19 declaration under Section 564(b)(1) of the  Act, 21 U.S.C.section 360bbb-3(b)(1), unless the authorization is terminated  or revoked sooner.       Influenza A by PCR NEGATIVE NEGATIVE Final   Influenza B by PCR NEGATIVE NEGATIVE Final    Comment: (NOTE) The Xpert Xpress SARS-CoV-2/FLU/RSV plus assay is intended as an aid in the diagnosis of influenza from Nasopharyngeal swab specimens and should not be used as a sole basis for treatment. Nasal washings and aspirates are unacceptable for Xpert Xpress SARS-CoV-2/FLU/RSV testing.  Fact Sheet for Patients: EntrepreneurPulse.com.au  Fact Sheet for Healthcare Providers: IncredibleEmployment.be  This test is not yet approved or cleared by the [place] FDA and has been authorized for detection and/or diagnosis of SARS-CoV-2 by FDA under an Emergency Use Authorization (EUA). This EUA will remain in effect (meaning this test can be used) for the duration of the COVID-19 declaration under Section 564(b)(1) of the Act, 21 U.S.C. section 360bbb-3(b)(1), unless the authorization is terminated or revoked.  Performed at [facility], [address] [place], [place] [address]      Radiological Exams on Admission: US Venous Img Lower Unilateral Right  Result Date: 11/14/2020 CLINICAL DATA:  Right leg pain for 4 days. EXAM: RIGHT LOWER EXTREMITY VENOUS DOPPLER ULTRASOUND TECHNIQUE: Gray-scale  sonography with graded compression, as well as color Doppler and duplex ultrasound were performed to evaluate the lower extremity deep venous systems from the level of the common femoral vein and including the common femoral, femoral, profunda femoral, popliteal and calf veins including the posterior tibial, peroneal and gastrocnemius veins when visible. The superficial great saphenous vein was also interrogated. Spectral Doppler was utilized to evaluate flow at rest and with distal augmentation maneuvers in the common femoral, femoral and popliteal veins. COMPARISON:  None. FINDINGS: Contralateral Common Femoral Vein: Respiratory phasicity is normal and symmetric with the symptomatic side. No evidence of thrombus. Normal compressibility. Common Femoral Vein: No evidence of thrombus. Normal compressibility, respiratory phasicity and response to augmentation. Saphenofemoral Junction: No evidence of thrombus. Normal compressibility and flow on color Doppler imaging. Profunda Femoral Vein: No evidence of thrombus. Normal compressibility and flow on color Doppler imaging. Femoral Vein: No evidence of thrombus. Normal compressibility, respiratory phasicity and response to augmentation. Popliteal Vein: No evidence of thrombus. Normal compressibility, respiratory phasicity and response to augmentation. Calf Veins: No evidence of thrombus. Normal compressibility and flow on color Doppler imaging. Superficial Great Saphenous Vein: No evidence of thrombus. Normal compressibility. Other Findings: Subcutaneous edema in the right calf. Normal appearing lymph nodes in the right groin. IMPRESSION: Negative for deep venous thrombosis in right lower extremity. Electronically Signed   By: Markus Daft M.D.   On: 11/14/2020 11:19   DG Foot Complete Right  Result Date: 11/14/2020 CLINICAL DATA:  45 year old male with diabetic foot wounds. Purulent drainage and bleeding. EXAM: RIGHT FOOT COMPLETE - 3+ VIEW COMPARISON:  None. FINDINGS:  Calcified peripheral vascular disease. Normal background bone mineralization. Dystrophic calcifications at the anterior distal tibia. No soft tissue gas. Generalized distal soft tissue swelling in the right foot. No fracture or dislocation. No cortical osteolysis identified. IMPRESSION: 1. Soft tissue swelling with no radiographic evidence of osteomyelitis. 2. Calcified peripheral vascular disease. Electronically Signed   By: Genevie Ann M.D.   On: 11/14/2020 05:49     EKG: Not done in ED, will get one.   Assessment/Plan Principal Problem:   Diabetic foot infection ([place]) Active Problems:   Tobacco use disorder   Type 2 diabetes mellitus with hyperglycemia, without long-term current use of  insulin ([place])   Diabetic foot ulcer ([place])   Sepsis ([place])   Alcohol use   Cellulitis of right foot   Sepsis due to diabetic foot ulcer with infection and cellulitis of right foot: Patient failed outpatient ciprofloxacin treatment.  Patient meets criteria for sepsis with tachycardia with heart rate 105 and tachypnea with RR 21.  Patient also has fever 100.1 and mild leukocytosis with WBC 11.5.  Lactic acid is normal.  Currently hemodynamically stable.  Dr. Cleda Mccreedy of podiatry is consulted.  - Admitted to MedSurg bed as inpatient - Empiric antimicrobial treatment with vancomycin and cefepime (patient received 1 dose of Rocephin in ED, due to history of Pseudomonas infection, changed to cefepime) - PRN Zofran for nausea, morphine and Percocet for pain - Blood cultures x 2  - ESR and CRP - MRI-right foot - will get Procalcitonin and trend lactic acid levels per sepsis protocol. - IVF: 2.0 L of NS bolus in ED, followed by 100cc/h - INR/PTT  Tobacco use disorder -Nicotine patch  Type 2 diabetes mellitus with hyperglycemia, without long-term current use of insulin ([place]): Recent A1c 7.9, poorly controlled.  Patient is taking metformin, Ozempic, Farxiga, glipizide. -SSI  HTN: Bp 110/70 -hold [place] since patient  has risk of developing hypotension due to sepsis -IV hydralazine as needed  Diabetic neuropathy: -Continue Neurontin           DVT ppx: SCD Code Status: Full code Family Communication: not done, no family member is at bed side.    Disposition Plan:  Anticipate discharge back to previous environment Consults called:  Dr. Cleda Mccreedy of podiatry Admission status and Level of care: Med-Surg:  as inpt     Status is: Inpatient  Remains inpatient appropriate because:Inpatient level of care appropriate due to severity of illness  Dispo: The patient is from: Home              Anticipated d/c is to: Home              Patient currently is not medically stable to d/c.   Difficult to place patient No           Date of Service 11/14/2020    Ivor Costa [facility]   If 7PM-7AM, please contact night-coverage [email or web address] 11/14/2020, 1:43 PM

## 2020-11-14 NOTE — Anesthesia Preprocedure Evaluation (Addendum)
Anesthesia Evaluation  Patient identified by MRN, date of birth, ID band Patient awake    Airway Mallampati: II  TM Distance: >3 FB Neck ROM: Full    Dental  (+) Teeth Intact   Pulmonary former smoker,           Cardiovascular hypertension,   hyperlipidemia   Neuro/Psych  Neuromuscular disease    GI/Hepatic GERD  ,  Endo/Other  diabetes, Poorly Controlledobesity  Renal/GU      Musculoskeletal Acute osteomyelitis    Abdominal   Peds  Hematology sepsis   Anesthesia Other Findings   Reproductive/Obstetrics                           Anesthesia Physical Anesthesia Plan  ASA: 3 and emergent  Anesthesia Plan: General   Post-op Pain Management:    Induction:   PONV Risk Score and Plan:   Airway Management Planned: Oral ETT  Additional Equipment:   Intra-op Plan:   Post-operative Plan: Extubation in OR  Informed Consent: I have reviewed the patients History and Physical, chart, labs and discussed the procedure including the risks, benefits and alternatives for the proposed anesthesia with the patient or authorized representative who has indicated his/her understanding and acceptance.     Dental advisory given  Plan Discussed with: CRNA  Anesthesia Plan Comments: (OSA score 3)      Anesthesia Quick Evaluation

## 2020-11-15 ENCOUNTER — Encounter: Payer: Self-pay | Admitting: Podiatry

## 2020-11-15 DIAGNOSIS — E11628 Type 2 diabetes mellitus with other skin complications: Secondary | ICD-10-CM

## 2020-11-15 DIAGNOSIS — L089 Local infection of the skin and subcutaneous tissue, unspecified: Secondary | ICD-10-CM

## 2020-11-15 LAB — BASIC METABOLIC PANEL
Anion gap: 11 (ref 5–15)
BUN: 11 mg/dL (ref 6–20)
CO2: 23 mmol/L (ref 22–32)
Calcium: 8.7 mg/dL — ABNORMAL LOW (ref 8.9–10.3)
Chloride: 101 mmol/L (ref 98–111)
Creatinine, Ser: 0.66 mg/dL (ref 0.61–1.24)
GFR, Estimated: >60 mL/min (ref >60)
Glucose, Bld: 114 mg/dL — ABNORMAL HIGH (ref 70–99)
Potassium: 3.8 mmol/L (ref 3.5–5.1)
Sodium: 135 mmol/L (ref 135–145)

## 2020-11-15 LAB — CBC
HCT: 37.5 % — ABNORMAL LOW (ref 39.0–52.0)
Hemoglobin: 12.6 g/dL — ABNORMAL LOW (ref 13.0–17.0)
MCH: 33.5 pg (ref 26.0–34.0)
MCHC: 33.6 g/dL (ref 30.0–36.0)
MCV: 99.7 fL (ref 80.0–100.0)
Platelets: 173 10*3/uL (ref 150–400)
RBC: 3.76 MIL/uL — ABNORMAL LOW (ref 4.22–5.81)
RDW: 12.9 % (ref 11.5–15.5)
WBC: 9.7 10*3/uL (ref 4.0–10.5)
nRBC: 0 % (ref 0.0–0.2)

## 2020-11-15 LAB — GLUCOSE, CAPILLARY
Glucose-Capillary: 128 mg/dL — ABNORMAL HIGH (ref 70–99)
Glucose-Capillary: 146 mg/dL — ABNORMAL HIGH (ref 70–99)
Glucose-Capillary: 153 mg/dL — ABNORMAL HIGH (ref 70–99)
Glucose-Capillary: 162 mg/dL — ABNORMAL HIGH (ref 70–99)
Glucose-Capillary: 196 mg/dL — ABNORMAL HIGH (ref 70–99)
Glucose-Capillary: 199 mg/dL — ABNORMAL HIGH (ref 70–99)

## 2020-11-15 MED ORDER — ENOXAPARIN SODIUM 60 MG/0.6ML IJ SOSY
0.5000 mg/kg | PREFILLED_SYRINGE | INTRAMUSCULAR | Status: DC
  Administered 2020-11-15 – 2020-11-17 (×3): 47.5 mg via SUBCUTANEOUS
  Filled 2020-11-15 (×3): qty 0.6

## 2020-11-15 MED ORDER — PNEUMOCOCCAL VAC POLYVALENT 25 MCG/0.5ML IJ INJ
0.5000 mL | INJECTION | INTRAMUSCULAR | Status: AC
  Administered 2020-11-17: 0.5 mL via INTRAMUSCULAR
  Filled 2020-11-15: qty 0.5

## 2020-11-15 MED ORDER — MUPIROCIN 2 % EX OINT
TOPICAL_OINTMENT | Freq: Every day | CUTANEOUS | Status: DC
  Filled 2020-11-15: qty 22

## 2020-11-15 NOTE — Evaluation (Signed)
Physical Therapy Evaluation Patient Details Name: Brendan Pruitt MRN: [id number] DOB: Nov 14, 1975 Today's Date: 11/15/2020   History of Present Illness  Brendan Pruitt is a 45 y.o. male with medical history significant of hypertension, hyperlipidemia, diabetes mellitus, s/p of total amputation, tobacco abuse, alcohol abuse in remission (patient states that he stopped drinking alcohol several months ago), who presents with foot ulcer.  Patient has chronic ulceration on both feet for more than 6 months.  Patient has been seen by podiatrist, Dr. Excell Seltzer. s/p I&D right foot with excision of fifth metatarsal phalangeal joint 11/14/20.  Clinical Impression  Patient received in bed, agrees to PT assessment. Patient reports no pain. Educated patient on wearing toe off loading shoes on B feet. Patient is independent with bed mobility and transfers. Ambulated 40 feet in room with RW. Slightly unsteady without RW due to shoes and heel weight bearing. Patient will continue to benefit from skilled PT while here to improve functional mobility and safety for return home.       Follow Up Recommendations No PT follow up    Equipment Recommendations  Rolling walker with 5" wheels    Recommendations for Other Services       Precautions / Restrictions Precautions Precautions: Fall Restrictions Weight Bearing Restrictions: Yes RLE Weight Bearing: Partial weight bearing RLE Partial Weight Bearing Percentage or Pounds: B heel weight bearing only with off loading shoes B      Mobility  Bed Mobility Overal bed mobility: Independent                  Transfers Overall transfer level: Independent Equipment used: None                Ambulation/Gait Ambulation/Gait assistance: Supervision Gait Distance (Feet): 40 Feet Assistive device: Rolling walker (2 wheeled) Gait Pattern/deviations: Step-through pattern Gait velocity: decr   General Gait Details: patient ambulated with B post op shoes,  heel weight bearing with RW and min guard to supervision. He is steady with mobility. He can ambulate some without RW, but improved balance and stability with RW.  Stairs            Wheelchair Mobility    Modified Rankin (Stroke Patients Only)       Balance Overall balance assessment: Modified Independent                                           Pertinent Vitals/Pain Pain Assessment: No/denies pain    Home Living Family/patient expects to be discharged to:: Private residence Living Arrangements: Spouse/significant other;Children Available Help at Discharge: Family;Available 24 hours/day Type of Home: House Home Access: Stairs to enter   [employer] of Steps: 3 Home Layout: One level Home Equipment: Crutches      Prior Function Level of Independence: Independent               Hand Dominance        Extremity/Trunk Assessment   Upper Extremity Assessment Upper Extremity Assessment: Overall WFL for tasks assessed         Cervical / Trunk Assessment Cervical / Trunk Assessment: Normal  Communication   Communication: No difficulties  Cognition Arousal/Alertness: Awake/alert Behavior During Therapy: WFL for tasks assessed/performed Overall Cognitive Status: Within Functional Limits for tasks assessed  General Comments      Exercises     Assessment/Plan    PT Assessment Patient needs continued PT services  PT Problem List Decreased strength;Decreased mobility;Decreased balance;Decreased knowledge of use of DME       PT Treatment Interventions DME instruction;Functional mobility training;Gait training;Patient/family education    PT Goals (Current goals can be found in the Care Plan section)  Acute Rehab PT Goals Patient Stated Goal: to return home PT Goal Formulation: With patient Time For Goal Achievement: 11/29/20 Potential to Achieve Goals: Good     Frequency Min 2X/week   Barriers to discharge        Co-evaluation               AM-PAC PT "6 Clicks" Mobility  Outcome Measure Help needed turning from your back to your side while in a flat bed without using bedrails?: None Help needed moving from lying on your back to sitting on the side of a flat bed without using bedrails?: None Help needed moving to and from a bed to a chair (including a wheelchair)?: A Little Help needed standing up from a chair using your arms (e.g., wheelchair or bedside chair)?: None Help needed to walk in hospital room?: A Little Help needed climbing 3-5 steps with a railing? : A Little 6 Click Score: 21    End of Session Equipment Utilized During Treatment: Gait belt Activity Tolerance: Patient tolerated treatment well Patient left: in chair;with call bell/phone within reach Nurse Communication: Mobility status PT Visit Diagnosis: Unsteadiness on feet (R26.81);Difficulty in walking, not elsewhere classified (R26.2)    Time: 7672-0947 PT Time Calculation (min) (ACUTE ONLY): 19 min   Charges:   PT Evaluation $PT Eval Moderate Complexity: 1 Mod PT Treatments $Gait Training: 8-22 mins        Tomi Grandpre, PT, GCS 11/15/20,11:42 AM

## 2020-11-15 NOTE — Consult Note (Signed)
NAME: Brendan Pruitt  DOB: August 19, 1975  MRN: [id number]  Date/Time: 11/15/2020 4:36 PM  REQUESTING PROVIDER: Dr.Shah Subjective:  REASON FOR CONSULT: rt foot infection ? Brendan Pruitt is a 45 y.o. male with a history of DM, neuropathic foot ulcers both feet for > 6 months, presents to the ED on 11/14/20 with rt lateral foot wound with purulent drainage /bleeding /pain and fever of 2 days. He was seen by Dr.Baker podiatrist on 11/10/20 and was started on cipro. As he had worsening swelling, pain with fever and chills he came to the ED. In the ED temp 100.1, BP 100/70, HR 105,  WBC 11.5, lactate 1.2, ESR 72. MRI of the foot showed Ulceration lateral to the distal fifth metatarsal, with adjacent abnormal marrow edema in the fifth metatarsal head and proximal shaft compatible with osteomyelitis. Equivocal osteomyelitis involving the proximal phalanx of the small toe. 2. Cellulitis in the vicinity of the ulceration. Edema tracks in the dorsum of the foot towards the ankle and could also reflect cellulitis. Was started on vanco and cefepime. Underwent I/D rt foot abscess with excision of fifth metatarsal and phalangeal joint on 11/14/20. Bone and tissue culture pending I am asked to see aptient for further management Culture from 10/12/20 was enterobacter, pseudomonas and GBS   Past Medical History:  Diagnosis Date   Acute osteomyelitis ([place]) 10/2019   phalanx left foot   Cellulitis and abscess of foot 10/2019   left.  using betadine wet to dry dressings.  treated with multiple antibiotics. 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   Hypertension    Neuromuscular disorder ([place])    diabetic neuropathy    Past Surgical History:  Procedure Laterality Date   AMPUTATION TOE Left 11/06/2019   Procedure: AMPUTATION TOE IPJ LEFT HALLUX;  Surgeon: Caroline More, DPM;  Location: [facility];  Service:  Podiatry;  Laterality: Left;   CATARACT EXTRACTION W/PHACO Right 09/21/2020   Procedure: CATARACT EXTRACTION PHACO AND INTRAOCULAR LENS PLACEMENT ([place]) RIGHT DIABETIC;  Surgeon: Leandrew Koyanagi, MD;  Location: [facility];  Service: Ophthalmology;  Laterality: Right;  Diabetic - oral meds cde 9.48 01:48.8 minutes 8.7%   CATARACT EXTRACTION W/PHACO Left 10/12/2020   Procedure: CATARACT EXTRACTION PHACO AND INTRAOCULAR LENS PLACEMENT ([place]) LEFT DIABETIC 8.16 01:37.0;  Surgeon: Leandrew Koyanagi, MD;  Location: [facility];  Service: Ophthalmology;  Laterality: Left;  Diabetic - oral meds   I & D EXTREMITY Right 11/14/2020   Procedure: IRRIGATION AND DEBRIDEMENT EXTREMITY;  Surgeon: Sharlotte Alamo, DPM;  Location: [facility];  Service: Podiatry;  Laterality: Right;   [place] SURGERY  2010   no metal   UMBILICAL HERNIA REPAIR  2010   mesh     Social History   Socioeconomic History   Marital status: Married    Spouse name: Soil scientist   Number of children: Not on file   Years of education: Not on file   Highest education level: Not on file  Occupational History   Not on file  Tobacco Use   Smoking status: Former    Packs/day: 0.25    Years: 4.00    Pack years: 1.00    Types: Cigarettes    Quit date: 2016    Years since quitting: 6.5   Smokeless tobacco: Never  Vaping Use   Vaping Use: Never used  Substance and Sexual Activity  Alcohol use: Yes    Alcohol/week: 20.0 standard drinks    Types: 20 Cans of beer per week    Comment: does not drink much lately   Drug use: No   Sexual activity: Yes  Other Topics Concern   Not on file  Social History Narrative   Patient lives with his wife and 5 children.   Social Determinants of Health   Financial Resource Strain: Not on file  Food Insecurity: Not on file  Transportation Needs: Not on file  Physical Activity: Not on file  Stress: Not on file  Social Connections: Not on file  Intimate Partner Violence: Not  on file    Family History  Problem Relation Age of Onset   Diabetes Mother    Diabetes Sister    No Known Allergies I? Current Facility-Administered Medications  Medication Dose Route Frequency Provider Last Rate Last Admin   0.9 %  sodium chloride infusion   Intravenous Continuous Ivor Costa, MD 100 mL/hr at 11/15/20 0609 Infusion Verify at 11/15/20 2035   acetaminophen (TYLENOL) tablet 650 mg  650 mg Oral Q6H PRN Ivor Costa, MD       ceFEPIme (MAXIPIME) 2 g in sodium chloride 0.9 % 100 mL IVPB  2 g Intravenous Q8H Ivor Costa, MD 200 mL/hr at 11/15/20 1528 2 g at 11/15/20 1528   enoxaparin (LOVENOX) injection 47.5 mg  0.5 mg/kg Subcutaneous Q24H Max Sane, MD       gabapentin (NEURONTIN) capsule 300 mg  300 mg Oral BID Ivor Costa, MD   300 mg at 11/15/20 0915   hydrALAZINE (APRESOLINE) injection 5 mg  5 mg Intravenous Q2H PRN Ivor Costa, MD       insulin aspart (novoLOG) injection 0-5 Units  0-5 Units Subcutaneous QHS Ivor Costa, MD       insulin aspart (novoLOG) injection 0-9 Units  0-9 Units Subcutaneous TID WC Ivor Costa, MD   2 Units at 11/15/20 1148   morphine 2 MG/ML injection 2 mg  2 mg Intravenous Q4H PRN Ivor Costa, MD   2 mg at 11/14/20 2309   multivitamin with minerals tablet 1 tablet  1 tablet Oral Daily Ivor Costa, MD   1 tablet at 11/15/20 0915   mupirocin ointment (BACTROBAN) 2 %   Topical Daily Sharlotte Alamo, DPM       omega-3 acid ethyl esters (LOVAZA) capsule 1 g  1 g Oral Daily Ivor Costa, MD   1 g at 11/15/20 0915   ondansetron (ZOFRAN) injection 4 mg  4 mg Intravenous Q8H PRN Ivor Costa, MD   4 mg at 11/14/20 1221   oxyCODONE-acetaminophen (PERCOCET/ROXICET) 5-325 MG per tablet 1-2 tablet  1-2 tablet Oral Q4H PRN Sharlotte Alamo, DPM       [START ON 11/16/2020] pneumococcal 23 valent vaccine (PNEUMOVAX-23) injection 0.5 mL  0.5 mL Intramuscular Tomorrow-1000 Ivor Costa, MD       vancomycin (VANCOCIN) IVPB 1000 mg/200 mL premix  1,000 mg Intravenous Q12H Benita Gutter, RPH  200 mL/hr at 11/15/20 1049 1,000 mg at 11/15/20 1049     Abtx:  Anti-infectives (From admission, onward)    Start     Dose/Rate Route Frequency Ordered Stop   11/15/20 0900  cefTRIAXone (ROCEPHIN) 2 g in sodium chloride 0.9 % 100 mL IVPB  Status:  Discontinued        2 g 200 mL/hr over 30 Minutes Intravenous Every 24 hours 11/14/20 1132 11/14/20 1400   11/15/20 0600  ceFEPIme (MAXIPIME) 2 g  in sodium chloride 0.9 % 100 mL IVPB        2 g 200 mL/hr over 30 Minutes Intravenous Every 8 hours 11/14/20 1401     11/14/20 2200  vancomycin (VANCOCIN) IVPB 1000 mg/200 mL premix        1,000 mg 200 mL/hr over 60 Minutes Intravenous Every 12 hours 11/14/20 1229     11/14/20 1518  vancomycin (VANCOCIN) powder  Status:  Discontinued          As needed 11/14/20 1518 11/14/20 1540   11/14/20 1200  vancomycin (VANCOCIN) IVPB 1000 mg/200 mL premix        1,000 mg 200 mL/hr over 60 Minutes Intravenous  Once 11/14/20 1157 11/14/20 1318   11/14/20 0915  vancomycin (VANCOCIN) IVPB 1000 mg/200 mL premix        1,000 mg 200 mL/hr over 60 Minutes Intravenous  Once 11/14/20 0901 11/14/20 1040   11/14/20 0915  cefTRIAXone (ROCEPHIN) 2 g in sodium chloride 0.9 % 100 mL IVPB        2 g 200 mL/hr over 30 Minutes Intravenous  Once 11/14/20 0901 11/14/20 0945       REVIEW OF SYSTEMS:  Const:  fever,  chills, negative weight loss Eyes: negative diplopia or visual changes, negative eye pain ENT: negative coryza, negative sore throat Resp: negative cough, hemoptysis, dyspnea Cards: negative for chest pain, palpitations, lower extremity edema GU: negative for frequency, dysuria and hematuria GI: Negative for abdominal pain, diarrhea, bleeding, constipation Skin: negative for rash and pruritus Heme: negative for easy bruising and gum/nose bleeding MS: neuropathy legs Neurolo:negative for headaches, dizziness, vertigo, memory problems  Psych: negative for feelings of anxiety, depression  Endocrine:   diabetes Allergy/Immunology- negative for any medication or food allergies ?  Objective:  VITALS:  BP 133/88 (BP Location: Left Arm)   Pulse 92   Temp 98.4 F (36.9 C)   Resp 18   Ht 5' 5"  (1.651 m)   Wt 96.6 kg   SpO2 99%   BMI 35.45 kg/m  PHYSICAL EXAM:  General: Alert, cooperative, no distress, appears stated age.  Head: Normocephalic, without obvious abnormality, atraumatic. Eyes: Conjunctivae clear, anicteric sclerae. Pupils are equal ENT Nares normal. No drainage or sinus tenderness. Lips, mucosa, and tongue normal. No Thrush Neck: Supple, symmetrical, no adenopathy, thyroid: non tender no carotid bruit and no JVD. Back: No CVA tenderness. Lungs: Clear to auscultation bilaterally. No Wheezing or Rhonchi. No rales. Heart: Regular rate and rhythm, no murmur, rub or gallop. Abdomen: Soft, non-tender,not distended. Bowel sounds normal. No masses Extremities: b/l foot dressing     Skin: No rashes or lesions. Or bruising Lymph: Cervical, supraclavicular normal. Neurologic: Grossly non-focal Pertinent Labs Lab Results CBC    Component Value Date/Time   WBC 9.7 11/15/2020 0502   RBC 3.76 (L) 11/15/2020 0502   HGB 12.6 (L) 11/15/2020 0502   HCT 37.5 (L) 11/15/2020 0502   PLT 173 11/15/2020 0502   MCV 99.7 11/15/2020 0502   MCH 33.5 11/15/2020 0502   MCHC 33.6 11/15/2020 0502   RDW 12.9 11/15/2020 0502   LYMPHSABS 2.0 11/14/2020 0236   MONOABS 1.5 (H) 11/14/2020 0236   EOSABS 0.1 11/14/2020 0236   BASOSABS 0.1 11/14/2020 0236    CMP Latest Ref Rng & Units 11/15/2020 11/14/2020 11/15/2019  Glucose 70 - 99 mg/dL 114(H) 241(H) 137(H)  BUN 6 - 20 mg/dL 11 14 13   Creatinine 0.61 - 1.24 mg/dL 0.66 0.77 0.94  Sodium 135 - 145 mmol/L  135 133(L) 133(L)  Potassium 3.5 - 5.1 mmol/L 3.8 3.8 3.6  Chloride 98 - 111 mmol/L 101 98 97(L)  CO2 22 - 32 mmol/L 23 24 25   Calcium 8.9 - 10.3 mg/dL 8.7(L) 9.3 9.1  Total Protein 6.5 - 8.1 g/dL - 8.3(H) 7.7  Total Bilirubin 0.3 - 1.2  mg/dL - 1.1 0.8  Alkaline Phos 38 - 126 U/L - 128(H) 81  AST 15 - 41 U/L - 29 27  ALT 0 - 44 U/L - 30 28      Microbiology: Recent Results (from the past 240 hour(s))  Blood culture (routine x 2)     Status: None (Preliminary result)   Collection Time: 11/14/20  2:36 AM   Specimen: BLOOD  Result Value Ref Range Status   Specimen Description BLOOD RIGHT ANTECUBITAL  Final   Special Requests   Final    BOTTLES DRAWN AEROBIC AND ANAEROBIC Blood Culture results may not be optimal due to an excessive volume of blood received in culture bottles   Culture   Final    NO GROWTH < 24 HOURS Performed at [facility], [address] [place], [place] [address]    Report Status PENDING  Incomplete  Blood culture (routine x 2)     Status: None (Preliminary result)   Collection Time: 11/14/20  8:41 AM   Specimen: BLOOD  Result Value Ref Range Status   Specimen Description BLOOD BLOOD RIGHT HAND  Final   Special Requests   Final    BOTTLES DRAWN AEROBIC AND ANAEROBIC Blood Culture results may not be optimal due to an excessive volume of blood received in culture bottles   Culture   Final    NO GROWTH < 24 HOURS Performed at [facility], [address], [place], [place] [address]    Report Status PENDING  Incomplete  Resp Panel by RT-PCR (Flu A&B, Covid) Nasopharyngeal Swab     Status: None   Collection Time: 11/14/20  8:41 AM   Specimen: Nasopharyngeal Swab; Nasopharyngeal(NP) swabs in vial transport medium  Result Value Ref Range Status   SARS Coronavirus 2 by RT PCR NEGATIVE NEGATIVE Final    Comment: (NOTE) SARS-CoV-2 target nucleic acids are NOT DETECTED.  The SARS-CoV-2 RNA is generally detectable in upper respiratory specimens during the acute phase of infection. The lowest concentration of SARS-CoV-2 viral copies this assay can detect is 138 copies/mL. A negative result does not preclude SARS-Cov-2 infection and should not be used as the sole basis for  treatment or other patient management decisions. A negative result may occur with  improper specimen collection/handling, submission of specimen other than nasopharyngeal swab, presence of viral mutation(s) within the areas targeted by this assay, and inadequate number of viral copies(<138 copies/mL). A negative result must be combined with clinical observations, patient history, and epidemiological information. The expected result is Negative.  Fact Sheet for Patients:  EntrepreneurPulse.com.au  Fact Sheet for Healthcare Providers:  IncredibleEmployment.be  This test is no t yet approved or cleared by the [place] FDA and  has been authorized for detection and/or diagnosis of SARS-CoV-2 by FDA under an Emergency Use Authorization (EUA). This EUA will remain  in effect (meaning this test can be used) for the duration of the COVID-19 declaration under Section 564(b)(1) of the Act, 21 U.S.C.section 360bbb-3(b)(1), unless the authorization is terminated  or revoked sooner.       Influenza A by PCR NEGATIVE NEGATIVE Final   Influenza B by PCR NEGATIVE NEGATIVE  Final    Comment: (NOTE) The Xpert Xpress SARS-CoV-2/FLU/RSV plus assay is intended as an aid in the diagnosis of influenza from Nasopharyngeal swab specimens and should not be used as a sole basis for treatment. Nasal washings and aspirates are unacceptable for Xpert Xpress SARS-CoV-2/FLU/RSV testing.  Fact Sheet for Patients: EntrepreneurPulse.com.au  Fact Sheet for Healthcare Providers: IncredibleEmployment.be  This test is not yet approved or cleared by the [place] FDA and has been authorized for detection and/or diagnosis of SARS-CoV-2 by FDA under an Emergency Use Authorization (EUA). This EUA will remain in effect (meaning this test can be used) for the duration of the COVID-19 declaration under Section 564(b)(1) of the Act, 21  U.S.C. section 360bbb-3(b)(1), unless the authorization is terminated or revoked.  Performed at [facility], [address], [place], [place] [address]   Aerobic/Anaerobic Culture w Gram Stain (surgical/deep wound)     Status: None (Preliminary result)   Collection Time: 11/14/20  3:06 PM   Specimen: PATH Other; Tissue  Result Value Ref Range Status   Specimen Description   Final    WOUND DEEP ABSCESS RIGHT FOOT Performed at [facility], [address] [place], [place] [address]    Special Requests   Final    NONE Performed at [facility], [address] [place], [place] [address]    Gram Stain   Final    FEW WBC PRESENT,BOTH PMN AND MONONUCLEAR FEW GRAM POSITIVE COCCI IN PAIRS IN CLUSTERS    Culture   Final    ABUNDANT STREPTOCOCCUS AGALACTIAE TESTING AGAINST S. AGALACTIAE NOT ROUTINELY PERFORMED DUE TO PREDICTABILITY OF AMP/PEN/VAN SUSCEPTIBILITY. Performed at [facility], [place] [address], [place], [place] [address]    Report Status PENDING  Incomplete  Aerobic/Anaerobic Culture w Gram Stain (surgical/deep wound)     Status: None (Preliminary result)   Collection Time: 11/14/20  3:06 PM   Specimen: PATH Other; Tissue  Result Value Ref Range Status   Specimen Description   Final    BONE 5TH METATARSAL HEAD Performed at [facility], [address] [place], [place] [address]    Special Requests   Final    NONE Performed at [facility], [address] [place], [place] [address]    Gram Stain   Final    RARE WBC PRESENT, PREDOMINANTLY MONONUCLEAR NO ORGANISMS SEEN Performed at [facility], [place] [address] [place], [place] [address]    Culture PENDING  Incomplete   Report Status PENDING  Incomplete    IMAGING RESULTS: . Ulceration lateral to the distal fifth metatarsal, with adjacent abnormal marrow edema in the fifth metatarsal head and proximal shaft compatible with osteomyelitis. Equivocal  osteomyelitis involving the proximal phalanx of the small toe. 2. Cellulitis in the vicinity of the ulceration. Edema tracks in the dorsum of the foot towards the ankle and could also reflect cellulitis. 3. Flexor hallucis longus tenosynovitis. I have personally reviewed the films ? Impression/Recommendation ? Diabetic foot infection of the rt foot- with peripheral neuropathy. S/p left toe amputation Bone Culture and pathology pending Culture from 10/12/20 grew pseudomonas, enterobacter, GBS Currently on vanco and cefepime- will change to zosyn Await biopsy and final culture to direct antibiotic route and duration  DM- poorly controlled- on insulin  Peripheral neuropathy due to DM -b/l ulcers -feet  ?Tobacco use ? ___________________________________________________ Discussed with patient, requesting provider Note:  This document was prepared using Dragon voice recognition software and may include unintentional dictation  errors.

## 2020-11-15 NOTE — Progress Notes (Addendum)
1 Day Post-Op   Subjective/Chief Complaint: Patient seen.  Some pain last night but doing well this morning.   Objective: Vital signs in last 24 hours: Temp:  [97 F (36.1 C)-99.4 F (37.4 C)] 98.2 F (36.8 C) (07/05 0837) Pulse Rate:  [90-105] 105 (07/05 0837) Resp:  [15-20] 18 (07/05 0837) BP: (106-145)/(70-90) 106/72 (07/05 0837) SpO2:  [91 %-98 %] 95 % (07/05 0837) Last BM Date: 11/14/20  Intake/Output from previous day: 07/04 0701 - 07/05 0700 In: 2903.9 [I.V.:1103.9; IV Piggyback:1800] Out: 1350 [Urine:1350] Intake/Output this shift: Total I/O In: -  Out: 725 [Urine:725]  Bandages dry and intact.  Upon removal there is some moderate bleeding on the bandage with no significant sign of purulence.  Erythema around the ankle has improved but still some significant erythema in the foot.  Incision and wound area appears well coapted.   Lab Results:  Recent Labs    11/14/20 0236 11/15/20 0502  WBC 11.5* 9.7  HGB 14.2 12.6*  HCT 41.7 37.5*  PLT 164 173   BMET Recent Labs    11/14/20 0236 11/15/20 0502  NA 133* 135  K 3.8 3.8  CL 98 101  CO2 24 23  GLUCOSE 241* 114*  BUN 14 11  CREATININE 0.77 0.66  CALCIUM 9.3 8.7*   PT/INR Recent Labs    11/14/20 1230  LABPROT 14.1  INR 1.1   ABG No results for input(s): PHART, HCO3 in the last 72 hours.  Invalid input(s): PCO2, PO2  Studies/Results: MR FOOT RIGHT WO CONTRAST  Result Date: 11/14/2020 CLINICAL DATA:  Osteomyelitis of the right foot. Ulcer along the fifth metatarsal. EXAM: MRI OF THE RIGHT FOREFOOT WITHOUT CONTRAST TECHNIQUE: Multiplanar, multisequence MR imaging of the right foot was performed. No intravenous contrast was administered. COMPARISON:  11/14/2020 FINDINGS: Bones/Joint/Cartilage Abnormal edema signal in the head and distal shaft of the fifth metatarsal especially along the metaphysis. Given the proximity to the ulceration the appearance is highly suspicious for osteomyelitis. Subtle edema  in the base of the proximal phalanx fifth toe is less specific but could represent early osteomyelitis is well. No other regions of bony concern identified. Ligaments Lisfranc ligament intact. Muscles and Tendons Diffuse low-grade edema signal in the musculature, probably neurogenic. Mild flexor hallucis longus tenosynovitis. Soft tissues Ulceration lateral to the distal fifth metatarsal. He infiltrative fluid in this vicinity likely manifestation of cellulitis. Dorsal subcutaneous edema tracking up towards the ankle may also reflect cellulitis. IMPRESSION: 1. Ulceration lateral to the distal fifth metatarsal, with adjacent abnormal marrow edema in the fifth metatarsal head and proximal shaft compatible with osteomyelitis. Equivocal osteomyelitis involving the proximal phalanx of the small toe. 2. Cellulitis in the vicinity of the ulceration. Edema tracks in the dorsum of the foot towards the ankle and could also reflect cellulitis. 3. Flexor hallucis longus tenosynovitis. Electronically Signed   By: Gaylyn Rong M.D.   On: 11/14/2020 14:28   US Venous Img Lower Unilateral Right  Result Date: 11/14/2020 CLINICAL DATA:  Right leg pain for 4 days. EXAM: RIGHT LOWER EXTREMITY VENOUS DOPPLER ULTRASOUND TECHNIQUE: Gray-scale sonography with graded compression, as well as color Doppler and duplex ultrasound were performed to evaluate the lower extremity deep venous systems from the level of the common femoral vein and including the common femoral, femoral, profunda femoral, popliteal and calf veins including the posterior tibial, peroneal and gastrocnemius veins when visible. The superficial great saphenous vein was also interrogated. Spectral Doppler was utilized to evaluate flow at rest and with  distal augmentation maneuvers in the common femoral, femoral and popliteal veins. COMPARISON:  None. FINDINGS: Contralateral Common Femoral Vein: Respiratory phasicity is normal and symmetric with the symptomatic side.  No evidence of thrombus. Normal compressibility. Common Femoral Vein: No evidence of thrombus. Normal compressibility, respiratory phasicity and response to augmentation. Saphenofemoral Junction: No evidence of thrombus. Normal compressibility and flow on color Doppler imaging. Profunda Femoral Vein: No evidence of thrombus. Normal compressibility and flow on color Doppler imaging. Femoral Vein: No evidence of thrombus. Normal compressibility, respiratory phasicity and response to augmentation. Popliteal Vein: No evidence of thrombus. Normal compressibility, respiratory phasicity and response to augmentation. Calf Veins: No evidence of thrombus. Normal compressibility and flow on color Doppler imaging. Superficial Great Saphenous Vein: No evidence of thrombus. Normal compressibility. Other Findings: Subcutaneous edema in the right calf. Normal appearing lymph nodes in the right groin. IMPRESSION: Negative for deep venous thrombosis in right lower extremity. Electronically Signed   By: Richarda Overlie M.D.   On: 11/14/2020 11:19   DG Foot Complete Right  Result Date: 11/14/2020 CLINICAL DATA:  45 year old male with diabetic foot wounds. Purulent drainage and bleeding. EXAM: RIGHT FOOT COMPLETE - 3+ VIEW COMPARISON:  None. FINDINGS: Calcified peripheral vascular disease. Normal background bone mineralization. Dystrophic calcifications at the anterior distal tibia. No soft tissue gas. Generalized distal soft tissue swelling in the right foot. No fracture or dislocation. No cortical osteolysis identified. IMPRESSION: 1. Soft tissue swelling with no radiographic evidence of osteomyelitis. 2. Calcified peripheral vascular disease. Electronically Signed   By: Odessa Fleming M.D.   On: 11/14/2020 05:49    Anti-infectives: Anti-infectives (From admission, onward)    Start     Dose/Rate Route Frequency Ordered Stop   11/15/20 0900  cefTRIAXone (ROCEPHIN) 2 g in sodium chloride 0.9 % 100 mL IVPB  Status:  Discontinued        2  g 200 mL/hr over 30 Minutes Intravenous Every 24 hours 11/14/20 1132 11/14/20 1400   11/15/20 0600  ceFEPIme (MAXIPIME) 2 g in sodium chloride 0.9 % 100 mL IVPB        2 g 200 mL/hr over 30 Minutes Intravenous Every 8 hours 11/14/20 1401     11/14/20 2200  vancomycin (VANCOCIN) IVPB 1000 mg/200 mL premix        1,000 mg 200 mL/hr over 60 Minutes Intravenous Every 12 hours 11/14/20 1229     11/14/20 1518  vancomycin (VANCOCIN) powder  Status:  Discontinued          As needed 11/14/20 1518 11/14/20 1540   11/14/20 1200  vancomycin (VANCOCIN) IVPB 1000 mg/200 mL premix        1,000 mg 200 mL/hr over 60 Minutes Intravenous  Once 11/14/20 1157 11/14/20 1318   11/14/20 0915  vancomycin (VANCOCIN) IVPB 1000 mg/200 mL premix        1,000 mg 200 mL/hr over 60 Minutes Intravenous  Once 11/14/20 0901 11/14/20 1040   11/14/20 0915  cefTRIAXone (ROCEPHIN) 2 g in sodium chloride 0.9 % 100 mL IVPB        2 g 200 mL/hr over 30 Minutes Intravenous  Once 11/14/20 0901 11/14/20 0945       Assessment/Plan: s/p Procedure(s): IRRIGATION AND DEBRIDEMENT EXTREMITY (Right) Assessment: Stable status post debridement right foot.  Plan: Betadine and a sterile dressing applied to the right foot.  Discussed with the patient the possibility of needing IV antibiotics on discharge.  Waiting for pathology and culture results.  We will place orders for  dressing changes on the left foot.  Patient may be weightbearing on the heels of both feet in his wedge surgical shoes.  LOS: 1 day    Ricci Barker 11/15/2020

## 2020-11-15 NOTE — TOC Initial Note (Signed)
Transition of Care [facility]) - Initial/Assessment Note    Patient Details  Name: Brendan Pruitt MRN: [id number] Date of Birth: [birth year]  Transition of Care [facility]) CM/SW Contact:    Chapman Fitch, RN Phone Number: 11/15/2020, 2:05 PM  Clinical Narrative:                 Patient s/p I&D of foot Patient states that he lives at home with wife and 5 children  PCP Hande Denies issues obtaining medications or transportation  Anticipated that patient will need IV antibiotics at home.  Heads up referral made to [facility] with [facility]. Patient in agreement  PT has assessed patient and does not recommend PT follow up.  Patient agreeable to Rw.  Ronda with [facility] to deliver for discharge   Expected Discharge Plan: Home w Home Health Services Barriers to Discharge: Continued Medical Work up   Patient Goals and CMS Choice        Expected Discharge Plan and Services Expected Discharge Plan: Home w Home Health Services   Discharge Planning Services: CM Consult   Living arrangements for the past 2 months: Post-Acute Facility                                      Prior Living Arrangements/Services Living arrangements for the past 2 months: Post-Acute Facility Lives with:: Spouse Patient language and need for interpreter reviewed:: Yes Do you feel safe going back to the place where you live?: Yes      Need for Family Participation in Patient Care: Yes (Comment) Care giver support system in place?: Yes (comment)      Activities of Daily Living Home Assistive Devices/Equipment: None ADL Screening (condition at time of admission) Patient's cognitive ability adequate to safely complete daily activities?: Yes Is the patient deaf or have difficulty hearing?: No Does the patient have difficulty seeing, even when wearing glasses/contacts?: No Does the patient have difficulty concentrating, remembering, or making decisions?: No Patient able to express need for assistance with  ADLs?: Yes Does the patient have difficulty dressing or bathing?: No Independently performs ADLs?: Yes (appropriate for developmental age) Does the patient have difficulty walking or climbing stairs?: Yes Weakness of Legs: None Weakness of Arms/Hands: None  Permission Sought/Granted                  Emotional Assessment Appearance:: Appears stated age     Orientation: : Oriented to Self, Oriented to Place, Oriented to  Time, Oriented to Situation Alcohol / Substance Use: Not Applicable Psych Involvement: No (comment)  Admission diagnosis:  Pain [R52] Cellulitis of right lower extremity [L03.115] Diabetic foot infection (HCC) [M25.003, L08.9] Sepsis, due to unspecified organism, unspecified whether acute organ dysfunction present [facility]) [A41.9] Patient Active Problem List   Diagnosis Date Noted   Diabetic foot infection (HCC) 11/14/2020   Diabetic foot ulcer (HCC) 11/14/2020   Sepsis (HCC) 11/14/2020   Alcohol use 11/14/2020   Cellulitis of right foot 11/14/2020   HTN (hypertension) 11/14/2020   Diabetic neuropathy (HCC) 11/14/2020   Cellulitis of right lower extremity    Type 2 diabetes mellitus with hyperglycemia, without long-term current use of insulin (HCC) 09/12/2020   Ulcers of both lower legs (HCC) 09/12/2020   History of amputation of left great toe (HCC) 05/31/2020   Blurring of vision 03/11/2020   History of 2019 novel coronavirus disease (COVID-19) 03/11/2020   Diabetic polyneuropathy associated  with type 2 diabetes mellitus (HCC) 10/17/2019   Hyperlipemia, mixed 08/02/2015   Tobacco use disorder 09/17/2014   PCP:  Barbette Reichmann, MD Pharmacy:   [facility] [address] [place], [place] - [address] [place] [address] Phone: [phone] Fax: [phone]  [id number] [place], [place] - [address] AT [place] [address] [place] [address] [place] [address] Phone: [phone]  Fax: [phone]     Social Determinants of Health (SDOH) Interventions    Readmission Risk Interventions No flowsheet data found.

## 2020-11-15 NOTE — Progress Notes (Addendum)
1       [place] at [facility]   PATIENT NAME: Brendan Pruitt    MR#:  [id number]  Barbette Reichmann, MD  DATE OF BIRTH:  10-05-1975  SUBJECTIVE:  CHIEF COMPLAINT:   Chief Complaint  Patient presents with  . Wound Infection  Patient denies any complaints except minimal pain at the site of surgery REVIEW OF SYSTEMS:  Review of Systems  Constitutional:  Negative for diaphoresis, fever, malaise/fatigue and weight loss.  HENT:  Negative for ear discharge, ear pain, hearing loss, nosebleeds, sore throat and tinnitus.   Eyes:  Negative for blurred vision and pain.  Respiratory:  Negative for cough, hemoptysis, shortness of breath and wheezing.   Cardiovascular:  Negative for chest pain, palpitations, orthopnea and leg swelling.  Gastrointestinal:  Negative for abdominal pain, blood in stool, constipation, diarrhea, heartburn, nausea and vomiting.  Genitourinary:  Negative for dysuria, frequency and urgency.  Musculoskeletal:  Positive for joint pain. Negative for back pain and myalgias.  Skin:  Negative for itching and rash.  Neurological:  Negative for dizziness, tingling, tremors, focal weakness, seizures, weakness and headaches.  Psychiatric/Behavioral:  Negative for depression. The patient is not nervous/anxious.   DRUG ALLERGIES:  No Known Allergies VITALS:  Blood pressure 106/72, pulse (!) 105, temperature 98.2 F (36.8 C), temperature source Oral, resp. rate 18, height 5\' 5"  (1.651 m), weight 96.6 kg, SpO2 95 %. PHYSICAL EXAMINATION:  Physical Exam 45 year old male lying in the bed comfortably without any acute distress Eyes pupils equal round reactive to light and accommodation Lungs clear to auscultation bilaterally, no wheezing rales rhonchi or crepitation Cardiovascular S1-S2 normal no murmur rales or gallop Abdomen soft, benign Neuro alert and oriented, nonfocal Skin: Bandages/dressing in place.  Dry.  No signs of infection LABORATORY PANEL:   Male CBC Recent Labs  Lab 11/15/20 0502  WBC 9.7  HGB 12.6*  HCT 37.5*  PLT 173   ------------------------------------------------------------------------------------------------------------------ Chemistries  Recent Labs  Lab 11/14/20 0236 11/15/20 0502  NA 133* 135  K 3.8 3.8  CL 98 101  CO2 24 23  GLUCOSE 241* 114*  BUN 14 11  CREATININE 0.77 0.66  CALCIUM 9.3 8.7*  AST 29  --   ALT 30  --   ALKPHOS 128*  --   BILITOT 1.1  --    RADIOLOGY:  No results found. ASSESSMENT AND PLAN:  45 y.o. male with medical history significant of hypertension, hyperlipidemia, diabetes mellitus, s/p of total amputation, tobacco abuse, alcohol abuse in remission (patient states that he stopped drinking alcohol several months ago) admitted for infected foot ulcer  Principal Problem:   Diabetic foot infection (HCC) Active Problems:   Tobacco use disorder   Type 2 diabetes mellitus with hyperglycemia, without long-term current use of insulin (HCC)   Diabetic foot ulcer (HCC)   Sepsis (HCC)   Cellulitis of right foot   HTN (hypertension)   Diabetic neuropathy (HCC)  Sepsis due to diabetic foot ulcer -present on admission 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  ID consult to decide antibiotics (IV versus p.o.) at discharge -Status post incision and drainage of right foot with excision of fifth metatarsal phalangeal joint on 7/4 by Dr. 9/4   Tobacco use disorder -Nicotine patch   Type 2 diabetes mellitus with hyperglycemia, without long-term current use of insulin [facility]): Recent A1c 7.9, poorly controlled.  Patient is taking metformin, Ozempic, Farxiga, glipizide. -SSI  HTN: -hold [place] for now -IV hydralazine as needed   Diabetic neuropathy: -Continue Neurontin    Body mass index is 35.45 kg/m.  Net IO Since Admission: 1,428.93 mL [11/15/20 1513]      Status is:  Inpatient  Remains inpatient appropriate because:Ongoing active pain requiring inpatient pain management, need to decide for antibiotic management at discharge and waiting for ID input  Dispo: The patient is from: Home              Anticipated d/c is to: Home              Patient currently is not medically stable to d/c.   Difficult to place patient No   DVT prophylaxis:       SCDs Start: 11/14/20 1213     Family Communication: "discussed with patient"   All the records are reviewed and case discussed with Care Management/Social Worker. Management plans discussed with the patient, podiatry, nursing and they are in agreement.  CODE STATUS: Full Code Level of care: Med-Surg  TOTAL TIME TAKING CARE OF THIS PATIENT: 35 minutes.   More than 50% of the time was spent in counseling/coordination of care: YES  POSSIBLE D/C IN 1-2 DAYS, DEPENDING ON CLINICAL CONDITION.  And antibiotic plans per ID/podiatry   Delfino Lovett M.D on 11/15/2020 at 3:13 PM  [facility]   CC: Primary care physician; Barbette Reichmann, MD  Note: This dictation was prepared with Dragon dictation along with smaller phrase technology. Any transcriptional errors that result from this process are unintentional.

## 2020-11-15 NOTE — Progress Notes (Signed)
Patient stated that he can speak and read in both [place] and Spanish, but prefers to read in [place]; education information given to patient in both [place] and [place]. Voiced understanding and agreement. Windy Carina, RN 5:13 AM 11/15/2020

## 2020-11-15 NOTE — Progress Notes (Signed)
Chaplain completed Advance Directive Education Patient requested info.

## 2020-11-16 ENCOUNTER — Inpatient Hospital Stay: Payer: Self-pay

## 2020-11-16 DIAGNOSIS — E1143 Type 2 diabetes mellitus with diabetic autonomic (poly)neuropathy: Secondary | ICD-10-CM

## 2020-11-16 LAB — BASIC METABOLIC PANEL
Anion gap: 9 (ref 5–15)
BUN: 9 mg/dL (ref 6–20)
CO2: 28 mmol/L (ref 22–32)
Calcium: 9.4 mg/dL (ref 8.9–10.3)
Chloride: 101 mmol/L (ref 98–111)
Creatinine, Ser: 0.57 mg/dL — ABNORMAL LOW (ref 0.61–1.24)
GFR, Estimated: >60 mL/min (ref >60)
Glucose, Bld: 122 mg/dL — ABNORMAL HIGH (ref 70–99)
Potassium: 3.7 mmol/L (ref 3.5–5.1)
Sodium: 138 mmol/L (ref 135–145)

## 2020-11-16 LAB — CBC
HCT: 39.9 % (ref 39.0–52.0)
Hemoglobin: 13.4 g/dL (ref 13.0–17.0)
MCH: 33.3 pg (ref 26.0–34.0)
MCHC: 33.6 g/dL (ref 30.0–36.0)
MCV: 99.3 fL (ref 80.0–100.0)
Platelets: 210 10*3/uL (ref 150–400)
RBC: 4.02 MIL/uL — ABNORMAL LOW (ref 4.22–5.81)
RDW: 12.6 % (ref 11.5–15.5)
WBC: 6.3 10*3/uL (ref 4.0–10.5)
nRBC: 0 % (ref 0.0–0.2)

## 2020-11-16 LAB — GLUCOSE, CAPILLARY
Glucose-Capillary: 121 mg/dL — ABNORMAL HIGH (ref 70–99)
Glucose-Capillary: 196 mg/dL — ABNORMAL HIGH (ref 70–99)
Glucose-Capillary: 254 mg/dL — ABNORMAL HIGH (ref 70–99)
Glucose-Capillary: 226 mg/dL — ABNORMAL HIGH (ref 70–99)

## 2020-11-16 LAB — SURGICAL PATHOLOGY

## 2020-11-16 MED ORDER — MUPIROCIN 2 % EX OINT
TOPICAL_OINTMENT | CUTANEOUS | Status: DC
  Filled 2020-11-16: qty 22

## 2020-11-16 MED ORDER — DIPHENHYDRAMINE HCL 25 MG PO CAPS
25.0000 mg | ORAL_CAPSULE | Freq: Four times a day (QID) | ORAL | Status: DC | PRN
  Administered 2020-11-16 – 2020-11-18 (×2): 25 mg via ORAL
  Filled 2020-11-16 (×2): qty 1

## 2020-11-16 MED ORDER — LOSARTAN POTASSIUM 25 MG PO TABS
25.0000 mg | ORAL_TABLET | Freq: Every day | ORAL | Status: DC
  Administered 2020-11-16 – 2020-11-17 (×2): 25 mg via ORAL
  Filled 2020-11-16 (×2): qty 1

## 2020-11-16 MED ORDER — PIPERACILLIN-TAZOBACTAM 3.375 G IVPB
3.3750 g | Freq: Three times a day (TID) | INTRAVENOUS | Status: DC
  Administered 2020-11-16 – 2020-11-18 (×8): 3.375 g via INTRAVENOUS
  Filled 2020-11-16 (×8): qty 50

## 2020-11-16 NOTE — Progress Notes (Signed)
   Date of Admission:  11/14/2020     ID: Brendan Pruitt is a 45 y.o. male  Principal Problem:   Diabetic foot infection (HCC) Active Problems:   Tobacco use disorder   Type 2 diabetes mellitus with hyperglycemia, without long-term current use of insulin (HCC)   Diabetic foot ulcer (HCC)   Sepsis (HCC)   Cellulitis of right foot   HTN (hypertension)   Diabetic neuropathy (HCC)    Subjective: Patient stable No new complaints.  Medications:   enoxaparin (LOVENOX) injection  0.5 mg/kg Subcutaneous Q24H   gabapentin  300 mg Oral BID   insulin aspart  0-5 Units Subcutaneous QHS   insulin aspart  0-9 Units Subcutaneous TID WC   losartan  25 mg Oral Daily   multivitamin with minerals  1 tablet Oral Daily   [START ON 11/17/2020] mupirocin ointment   Topical QODAY   omega-3 acid ethyl esters  1 g Oral Daily   pneumococcal 23 valent vaccine  0.5 mL Intramuscular Tomorrow-1000    Objective: Vital signs in last 24 hours: Temp:  [97.4 F (36.3 C)-98.6 F (37 C)] 97.5 F (36.4 C) (07/06 1531) Pulse Rate:  [84-95] 86 (07/06 1531) Resp:  [16-20] 16 (07/06 1531) BP: (132-161)/(94-102) 132/94 (07/06 1531) SpO2:  [95 %-100 %] 100 % (07/06 1531)  PHYSICAL EXAM:  General: Alert, cooperative, no distress, appears stated age.  Head: Normocephalic, without obvious abnormality, atraumatic. Eyes: Conjunctivae clear, anicteric sclerae. Pupils are equal ENT Nares normal. No drainage or sinus tenderness. Lips, mucosa, and tongue normal. No Thrush Neck: Supple, symmetrical, no adenopathy, thyroid: non tender no carotid bruit and no JVD. Back: No CVA tenderness. Lungs: Clear to auscultation bilaterally. No Wheezing or Rhonchi. No rales. Heart: Regular rate and rhythm, no murmur, rub or gallop. Abdomen: Soft, non-tender,not distended. Bowel sounds normal. No masses Extremities: Both legs dressing not removed Picture reviewed  Maceration at the surgical site Surrounding erythema Skin: No  rashes or lesions. Or bruising Lymph: Cervical, supraclavicular normal. Neurologic: Grossly non-focal  Lab Results Recent Labs    11/15/20 0502 11/16/20 0526  WBC 9.7 6.3  HGB 12.6* 13.4  HCT 37.5* 39.9  NA 135 138  K 3.8 3.7  CL 101 101  CO2 23 28  BUN 11 9  CREATININE 0.66 0.57*   Liver Panel Recent Labs    11/14/20 0236  PROT 8.3*  ALBUMIN 3.9  AST 29  ALT 30  ALKPHOS 128*  BILITOT 1.1   Sedimentation Rate Recent Labs    11/14/20 0841  ESRSEDRATE 72*   C-Reactive Protein Recent Labs    11/14/20 1230  CRP 14.0*    Microbiology:  Studies/Results: No results found.   Assessment/Plan: Diabetic foot infection of the right foot.  Has peripheral neuropathy.  Status post left toe amputation.  Surrounding cellulitis.  Wound culture has grew B Streptococcus Pathology shows acute osteomyelitis.  The proximal margin could not be identified to look for clearance. Culture in the past from 10/12/2020 grew Pseudomonas, Enterobacter and GBS.  Patient is currently on Zosyn.  But  this time the operative culture has shown only GBS which could be due to him being on ciprofloxacin prior to this admission. Podiatry is watching foot closely for any worsening.  Patient likely will need IV antibiotics on discharge.  Diabetes mellitus.  Poorly controlled.  On insulin  Peripheral neuropathy due to diabetes mellitus. Bilateral trophic ulcers  Tobacco use  Discussed with patient and the care team.

## 2020-11-16 NOTE — Progress Notes (Signed)
Physical Therapy Treatment Patient Details Name: Brendan Pruitt MRN: [id number] DOB: [birth year] Today's Date: 11/16/2020    History of Present Illness Brendan Pruitt is a 45 y.o. male with medical history significant of hypertension, hyperlipidemia, diabetes mellitus, s/p of total amputation, tobacco abuse, alcohol abuse in remission (patient states that he stopped drinking alcohol several months ago), who presents with foot ulcer.  Patient has chronic ulceration on both feet for more than 6 months.  Patient has been seen by podiatrist, Dr. Excell Seltzer. s/p I&D right foot with excision of fifth metatarsal phalangeal joint 11/14/20.    PT Comments    Patient received up in recliner. Reports he has been up walking in room. Pushes IV pole. RW received for him to take home. Patient has good compliance with post op shoe wearing. Demonstrates safe with mobility in room. Will need to practice stair training prior to discharge although I do not see that he will have difficulty with this. Patient will continue to benefit from skilled PT while here to improve safety.        Follow Up Recommendations  No PT follow up     Equipment Recommendations  Rolling walker with 5" wheels    Recommendations for Other Services       Precautions / Restrictions Precautions Precaution Comments: mod fall Restrictions Weight Bearing Restrictions: Yes RLE Weight Bearing: Partial weight bearing RLE Partial Weight Bearing Percentage or Pounds: heel weight bearing only in post op toe off loading shoes.    Mobility  Bed Mobility               General bed mobility comments: patient received in recliner    Transfers Overall transfer level: Independent Equipment used: None                Ambulation/Gait Ambulation/Gait assistance: Supervision Gait Distance (Feet): 40 Feet Assistive device: IV Pole Gait Pattern/deviations: Step-through pattern Gait velocity: WFL   General Gait Details: patient  ambulating in room with post op shoes donned. Heel weight bearing. Ambulating to bathroom independently. Demonstrates good compliance with shoe wearing and demonstrates safety with mobility over short distances in room.   Stairs             Wheelchair Mobility    Modified Rankin (Stroke Patients Only)       Balance Overall balance assessment: Independent                                          Cognition Arousal/Alertness: Awake/alert Behavior During Therapy: WFL for tasks assessed/performed Overall Cognitive Status: Within Functional Limits for tasks assessed                                        Exercises      General Comments        Pertinent Vitals/Pain Pain Assessment: No/denies pain    Home Living                      Prior Function            PT Goals (current goals can now be found in the care plan section) Acute Rehab PT Goals Patient Stated Goal: to return home PT Goal Formulation: With patient Time For Goal Achievement: 11/29/20 Potential to Achieve Goals: Good  Progress towards PT goals: Progressing toward goals    Frequency           PT Plan Current plan remains appropriate    Co-evaluation              AM-PAC PT "6 Clicks" Mobility   Outcome Measure  Help needed turning from your back to your side while in a flat bed without using bedrails?: None Help needed moving from lying on your back to sitting on the side of a flat bed without using bedrails?: None Help needed moving to and from a bed to a chair (including a wheelchair)?: None Help needed standing up from a chair using your arms (e.g., wheelchair or bedside chair)?: None Help needed to walk in hospital room?: None Help needed climbing 3-5 steps with a railing? : None 6 Click Score: 24    End of Session   Activity Tolerance: Patient tolerated treatment well Patient left: in chair;with call bell/phone within reach Nurse  Communication: Mobility status PT Visit Diagnosis: Difficulty in walking, not elsewhere classified (R26.2)     Time: 5852-7782 PT Time Calculation (min) (ACUTE ONLY): 10 min  Charges:  $Gait Training: 8-22 mins                    Brendan Pruitt, PT, GCS 11/16/20,3:36 PM

## 2020-11-16 NOTE — Progress Notes (Signed)
Date and time results received: 11/16/20 1150 (use smartphrase ".now" to insert current time)  Test: wound culture Critical Value: rare streptoccoccu agalactiae  Name of Provider Notified: Dr. Rivka Safer  Orders Received? Or Actions Taken?: MD notified no new orders received

## 2020-11-16 NOTE — Progress Notes (Signed)
1       [place] at [facility]   PATIENT NAME: Brendan Pruitt    MR#:  [id number]  Barbette Reichmann, MD  DATE OF BIRTH:  1976-05-11  SUBJECTIVE:  CHIEF COMPLAINT:   Chief Complaint  Patient presents with  . Wound Infection  Reports minor allergic reaction from brown paper towel last night and improved with Benadryl.  Minimal pain at the surgical site.  Blood pressure running high and requesting to resume his home blood pressure medicine REVIEW OF SYSTEMS:  Review of Systems  Constitutional:  Negative for diaphoresis, fever, malaise/fatigue and weight loss.  HENT:  Negative for ear discharge, ear pain, hearing loss, nosebleeds, sore throat and tinnitus.   Eyes:  Negative for blurred vision and pain.  Respiratory:  Negative for cough, hemoptysis, shortness of breath and wheezing.   Cardiovascular:  Negative for chest pain, palpitations, orthopnea and leg swelling.  Gastrointestinal:  Negative for abdominal pain, blood in stool, constipation, diarrhea, heartburn, nausea and vomiting.  Genitourinary:  Negative for dysuria, frequency and urgency.  Musculoskeletal:  Positive for joint pain. Negative for back pain and myalgias.  Skin:  Negative for itching and rash.  Neurological:  Negative for dizziness, tingling, tremors, focal weakness, seizures, weakness and headaches.  Psychiatric/Behavioral:  Negative for depression. The patient is not nervous/anxious.   DRUG ALLERGIES:   Allergies  Allergen Reactions  . Other Itching    Brown Paper towels   VITALS:  Blood pressure (!) 132/94, pulse 86, temperature (!) 97.5 F (36.4 C), temperature source Oral, resp. rate 16, height 5\' 5"  (1.651 m), weight 96.6 kg, SpO2 100 %. PHYSICAL EXAMINATION:  Physical Exam 45 year old male lying in the bed comfortably without any acute distress Eyes pupils equal round reactive to light and accommodation Lungs clear to auscultation bilaterally, no wheezing rales rhonchi or  crepitation Cardiovascular S1-S2 normal no murmur rales or gallop Abdomen soft, benign Neuro alert and oriented, nonfocal Skin: Bandages/dressing in place.  Erythema present around the dorsal aspect of right foot LABORATORY PANEL:  Male CBC Recent Labs  Lab 11/16/20 0526  WBC 6.3  HGB 13.4  HCT 39.9  PLT 210    ------------------------------------------------------------------------------------------------------------------ Chemistries  Recent Labs  Lab 11/14/20 0236 11/15/20 0502 11/16/20 0526  NA 133*   < > 138  K 3.8   < > 3.7  CL 98   < > 101  CO2 24   < > 28  GLUCOSE 241*   < > 122*  BUN 14   < > 9  CREATININE 0.77   < > 0.57*  CALCIUM 9.3   < > 9.4  AST 29  --   --   ALT 30  --   --   ALKPHOS 128*  --   --   BILITOT 1.1  --   --    < > = values in this interval not displayed.    RADIOLOGY:  No results found. ASSESSMENT AND PLAN:  45 y.o. male with medical history significant of hypertension, hyperlipidemia, diabetes mellitus, s/p of total amputation, tobacco abuse, alcohol abuse in remission (patient states that he stopped drinking alcohol several months ago) admitted for infected foot ulcer  Principal Problem:   Diabetic foot infection (HCC) Active Problems:   Tobacco use disorder   Type 2 diabetes mellitus with hyperglycemia, without long-term current use of insulin (HCC)   Diabetic foot ulcer (HCC)   Sepsis (HCC)   Cellulitis of right foot   HTN (hypertension)   Diabetic  neuropathy (HCC)  Sepsis due to diabetic foot ulcer -present on admission 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 Alberteen Spindle -Podiatry has seen and done dressing changes today.  More than likely he will need IV antibiotics at discharge.  His wound is growing strep  agalactiae.  I will order PICC line for tomorrow -Depending on his wound progress he may need revision surgery or MRI to evaluate deep abscess pocket -Weightbearing on the heels of both feet in his wedge surgical shoes per podiatry   Tobacco use disorder -Nicotine patch   Type 2 diabetes mellitus with hyperglycemia, without long-term current use of insulin (HCC): Recent A1c 7.9, poorly controlled.  Patient is taking metformin, Ozempic, Farxiga, glipizide. -SSI   HTN: -Resume losartan as blood pressure has been running high on 7/6 -IV hydralazine as needed   Diabetic neuropathy: -Continue Neurontin    Body mass index is 35.45 kg/m.  Net IO Since Admission: 3,584.55 mL [11/16/20 1652]      Status is: Inpatient  Remains inpatient appropriate because:Ongoing active pain requiring inpatient pain management, May need revision surgery or MRI before discharge per podiatry.  Outpatient antibiotic per ID  Dispo: The patient is from: Home              Anticipated d/c is to: Home              Patient currently is not medically stable to d/c.     Difficult to place patient No   DVT prophylaxis:       SCDs Start: 11/14/20 1213     Family Communication: "discussed with patient"   All the records are reviewed and case discussed with Care Management/Social Worker. Management plans discussed with the patient, podiatry, nursing and they are in agreement.  CODE STATUS: Full Code Level of care: Med-Surg  TOTAL TIME TAKING CARE OF THIS PATIENT: 35 minutes.   More than 50% of the time was spent in counseling/coordination of care: YES  POSSIBLE D/C IN 1-2 DAYS, DEPENDING ON CLINICAL CONDITION.  And antibiotic plans per ID/podiatry   Delfino Lovett M.D on 11/16/2020 at 4:52 PM  [facility]   CC: Primary care physician; Barbette Reichmann, MD  Note: This dictation was prepared with Dragon dictation along with smaller phrase technology. Any transcriptional errors that result from  this process are unintentional.

## 2020-11-16 NOTE — Progress Notes (Signed)
Pharmacy Antibiotic Note  Brendan Pruitt is a 45 y.o. male admitted on 11/14/2020 with  wound infection .  Pharmacy has been consulted for Zosyn dosing.  Plan: Zosyn 3.375g IV q8h (4 hour infusion).  Height: 5\' 5"  (165.1 cm) Weight: 96.6 kg (213 lb) IBW/kg (Calculated) : 61.5  Temp (24hrs), Avg:98.7 F (37.1 C), Min:98.2 F (36.8 C), Max:99.4 F (37.4 C)  Recent Labs  Lab 11/14/20 0236 11/15/20 0502  WBC 11.5* 9.7  CREATININE 0.77 0.66  LATICACIDVEN 1.2  --     Estimated Creatinine Clearance: 128.5 mL/min (by C-G formula based on SCr of 0.66 mg/dL).    No Known Allergies  Antimicrobials this admission:   >>    >>   Dose adjustments this admission:   Microbiology results:  BCx:   UCx:    Sputum:    MRSA PCR:   Thank you for allowing pharmacy to be a part of this patient's care.  Ariellah Faust D 11/16/2020 1:24 AM

## 2020-11-16 NOTE — Progress Notes (Signed)
Mobility Specialist - Progress Note   11/16/20 1538  Mobility  Activity Refused mobility  Mobility performed by Mobility specialist    Pt declined mobility at this time, no reason specified. Reports he's been ambulating in room today and just finished ambulating with PT prior to attempt. Will attempt session another date/time as appropriate.    Filiberto Pinks Mobility Specialist 11/16/20, 3:38 PM

## 2020-11-16 NOTE — Plan of Care (Signed)

## 2020-11-16 NOTE — Progress Notes (Addendum)
Patient complains of itching on his face after using "brown paper towels" on is face; stated that this has happened before. No rash noted; soap given and he will use white paper towels for rest of hospitalization. Dr. Arville Care notified via secure chat for Benadryl for itching. New order written. Will continue to monitor. Windy Carina, RN ; 3:17 AM ;11/16/2020   Manson Passey paper towels added to allergy list. Windy Carina, RN

## 2020-11-16 NOTE — Progress Notes (Signed)
Foot and ankle surgery progress note  2 Days Post-Op   Subjective/Chief Complaint: Brendan Pruitt is a 45 year old male who presents today resting in bedside chair comfortably.  Patient has minimal to no pain at this time to the right or left foot.  Patient has not noticed any drainage through his bandage and has been getting his dressings changed as instructed.  Patient currently denies nausea, vomiting, fever, chills.  Objective: Vital signs in last 24 hours: Temp:  [97.4 F (36.3 C)-98.6 F (37 C)] 98.4 F (36.9 C) (07/06 0742) Pulse Rate:  [84-92] 89 (07/06 0742) Resp:  [16-20] 16 (07/06 0742) BP: (133-161)/(88-102) 161/99 (07/06 0742) SpO2:  [95 %-99 %] 99 % (07/06 0742) Last BM Date: 11/15/20  Intake/Output from previous day: 07/05 0701 - 07/06 0700 In: 3265.6 [P.O.:600; I.V.:2065.4; IV Piggyback:600.2] Out: 1475 [Urine:1475] Intake/Output this shift: Total I/O In: 240 [P.O.:240] Out: -   Right foot incision line to the fifth metatarsal phalangeal joint resection area appears to be macerated, serous sanguinous drainage present, no purulence, no easily palpable fluctuance to the area.  Still has some fairly moderate erythema and edema present to the right dorsal lateral foot.   Left plantar lateral fifth metatarsal phalangeal joint wound appears to be stable and has a granular base overall and appears to be healing well.  Wound measures approximately 1.5 cm x 1 cm x 0.1 cm, no signs of infection present this area.  Right plantar fourth metatarsal phalange joint ulcerated area appears to be stable and healing well overall as well, appears to be getting smaller in size compared to office visit.  Measures approximately 3 cm x 1.2 cm x 0.1 cm, no signs of infection present to the area.  Status post right fifth metatarsal phalangeal joint resection, left hallux partial amputation.  Lab Results:  Recent Labs    11/15/20 0502 11/16/20 0526  WBC 9.7 6.3  HGB 12.6* 13.4  HCT  37.5* 39.9  PLT 173 210    BMET Recent Labs    11/15/20 0502 11/16/20 0526  NA 135 138  K 3.8 3.7  CL 101 101  CO2 23 28  GLUCOSE 114* 122*  BUN 11 9  CREATININE 0.66 0.57*  CALCIUM 8.7* 9.4    PT/INR Recent Labs    11/14/20 1230  LABPROT 14.1  INR 1.1    ABG No results for input(s): PHART, HCO3 in the last 72 hours.  Invalid input(s): PCO2, PO2  Studies/Results: MR FOOT RIGHT WO CONTRAST  Result Date: 11/14/2020 CLINICAL DATA:  Osteomyelitis of the right foot. Ulcer along the fifth metatarsal. EXAM: MRI OF THE RIGHT FOREFOOT WITHOUT CONTRAST TECHNIQUE: Multiplanar, multisequence MR imaging of the right foot was performed. No intravenous contrast was administered. COMPARISON:  11/14/2020 FINDINGS: Bones/Joint/Cartilage Abnormal edema signal in the head and distal shaft of the fifth metatarsal especially along the metaphysis. Given the proximity to the ulceration the appearance is highly suspicious for osteomyelitis. Subtle edema in the base of the proximal phalanx fifth toe is less specific but could represent early osteomyelitis is well. No other regions of bony concern identified. Ligaments Lisfranc ligament intact. Muscles and Tendons Diffuse low-grade edema signal in the musculature, probably neurogenic. Mild flexor hallucis longus tenosynovitis. Soft tissues Ulceration lateral to the distal fifth metatarsal. He infiltrative fluid in this vicinity likely manifestation of cellulitis. Dorsal subcutaneous edema tracking up towards the ankle may also reflect cellulitis. IMPRESSION: 1. Ulceration lateral to the distal fifth metatarsal, with adjacent abnormal marrow edema in the fifth  metatarsal head and proximal shaft compatible with osteomyelitis. Equivocal osteomyelitis involving the proximal phalanx of the small toe. 2. Cellulitis in the vicinity of the ulceration. Edema tracks in the dorsum of the foot towards the ankle and could also reflect cellulitis. 3. Flexor hallucis longus  tenosynovitis. Electronically Signed   By: Gaylyn Rong M.D.   On: 11/14/2020 14:28    Anti-infectives: Anti-infectives (From admission, onward)    Start     Dose/Rate Route Frequency Ordered Stop   11/16/20 0600  piperacillin-tazobactam (ZOSYN) IVPB 3.375 g        3.375 g 12.5 mL/hr over 240 Minutes Intravenous Every 8 hours 11/16/20 0124     11/15/20 0900  cefTRIAXone (ROCEPHIN) 2 g in sodium chloride 0.9 % 100 mL IVPB  Status:  Discontinued        2 g 200 mL/hr over 30 Minutes Intravenous Every 24 hours 11/14/20 1132 11/14/20 1400   11/15/20 0600  ceFEPIme (MAXIPIME) 2 g in sodium chloride 0.9 % 100 mL IVPB  Status:  Discontinued        2 g 200 mL/hr over 30 Minutes Intravenous Every 8 hours 11/14/20 1401 11/16/20 0101   11/14/20 2200  vancomycin (VANCOCIN) IVPB 1000 mg/200 mL premix  Status:  Discontinued        1,000 mg 200 mL/hr over 60 Minutes Intravenous Every 12 hours 11/14/20 1229 11/16/20 0101   11/14/20 1518  vancomycin (VANCOCIN) powder  Status:  Discontinued          As needed 11/14/20 1518 11/14/20 1540   11/14/20 1200  vancomycin (VANCOCIN) IVPB 1000 mg/200 mL premix        1,000 mg 200 mL/hr over 60 Minutes Intravenous  Once 11/14/20 1157 11/14/20 1318   11/14/20 0915  vancomycin (VANCOCIN) IVPB 1000 mg/200 mL premix        1,000 mg 200 mL/hr over 60 Minutes Intravenous  Once 11/14/20 0901 11/14/20 1040   11/14/20 0915  cefTRIAXone (ROCEPHIN) 2 g in sodium chloride 0.9 % 100 mL IVPB        2 g 200 mL/hr over 30 Minutes Intravenous  Once 11/14/20 0901 11/14/20 0945       Assessment/Plan: s/p Procedure(s): IRRIGATION AND DEBRIDEMENT EXTREMITY (Right) Assessment: Stable status post debridement right foot.  Plan:  -Patient seen and examined -Patient still is to have a fair amount of erythema and edema present to the dorsal lateral aspect right foot.  No palpable fluctuance easily accessible with examination today.  No purulence noted from the wound.  Fair  amount of maceration present to the incision line.  No obvious dehiscence present. -Betadine and a sterile dressing applied to the right and left foot.  Continue to recommend daily changes.  Applied Betadine soaked gauze to all wounds followed by 4 x 4 gauze, ABD, Kerlix, Ace wrap with minimal compression.   -Path report came back positive for bone infection at the fifth metatarsal phalangeal joint.  Margins were not oriented apparently in the proper orientation so no comment was made on margin of amputation sites.  Culture report taken from abscess and fifth metatarsal phalangeal joint both positive for bacterial growth, both growing strep agalactiae. -Based on appearance of foot we will likely have to place on IV antibiotics.  Defer to infectious disease for further recommendations.  We will continue to monitor foot closely.  If he continues to appear to be very erythematous and edematous without improvement then could take back for further surgical debridement and likely revision  partial right fifth ray amputation.  Could also consider repeat MRI to look for any further pockets of deep abscess.  We will continue to monitor closely. -Patient may be weightbearing on the heels of both feet in his wedge surgical shoes.   LOS: 2 days    Rosetta Posner, [facility] 11/16/2020

## 2020-11-17 ENCOUNTER — Encounter: Payer: Self-pay | Admitting: Internal Medicine

## 2020-11-17 LAB — GLUCOSE, CAPILLARY
Glucose-Capillary: 163 mg/dL — ABNORMAL HIGH (ref 70–99)
Glucose-Capillary: 214 mg/dL — ABNORMAL HIGH (ref 70–99)
Glucose-Capillary: 183 mg/dL — ABNORMAL HIGH (ref 70–99)
Glucose-Capillary: 208 mg/dL — ABNORMAL HIGH (ref 70–99)

## 2020-11-17 MED ORDER — LOSARTAN POTASSIUM 50 MG PO TABS
50.0000 mg | ORAL_TABLET | Freq: Every day | ORAL | Status: DC
  Administered 2020-11-18: 50 mg via ORAL
  Filled 2020-11-17: qty 1

## 2020-11-17 MED ORDER — CHLORHEXIDINE GLUCONATE CLOTH 2 % EX PADS
6.0000 | MEDICATED_PAD | Freq: Every day | CUTANEOUS | Status: DC
  Administered 2020-11-17 – 2020-11-18 (×2): 6 via TOPICAL

## 2020-11-17 MED ORDER — SODIUM CHLORIDE 0.9% FLUSH
10.0000 mL | INTRAVENOUS | Status: DC | PRN
  Administered 2020-11-17: 10 mL

## 2020-11-17 MED ORDER — SODIUM CHLORIDE 0.9% FLUSH
10.0000 mL | Freq: Two times a day (BID) | INTRAVENOUS | Status: DC
  Administered 2020-11-17: 10 mL

## 2020-11-17 NOTE — Progress Notes (Signed)
Peripherally Inserted Central Catheter Placement  The IV Nurse has discussed with the patient and/or persons authorized to consent for the patient, the purpose of this procedure and the potential benefits and risks involved with this procedure.  The benefits include less needle sticks, lab draws from the catheter, and the patient may be discharged home with the catheter. Risks include, but not limited to, infection, bleeding, blood clot (thrombus formation), and puncture of an artery; nerve damage and irregular heartbeat and possibility to perform a PICC exchange if needed/ordered by physician.  Alternatives to this procedure were also discussed.  Bard Power PICC patient education guide, fact sheet on infection prevention and patient information card has been provided to patient /or left at bedside.    PICC Placement Documentation  PICC Single Lumen 11/17/20 Right Basilic 43 cm 0 cm (Active)  Indication for Insertion or Continuance of Line Home intravenous therapies (PICC only) 11/17/20 1500  Exposed Catheter (cm) 0 cm 11/17/20 1500  Site Assessment Clean;Dry;Intact 11/17/20 1500  Line Status Flushed;Blood return noted 11/17/20 1500  Dressing Type Transparent 11/17/20 1500  Dressing Status Clean;Dry;Intact 11/17/20 1500  Antimicrobial disc in place? Yes 11/17/20 1500  Dressing Change Due 11/24/20 11/17/20 1500       Stacie Glaze Horton 11/17/2020, 4:00 PM

## 2020-11-17 NOTE — Progress Notes (Signed)
Foot and ankle surgery progress note  3 Days Post-Op   Subjective/Chief Complaint: Brendan Pruitt is a 45 year old male who presents today resting in bedside chair comfortably.  Patient has minimal to no pain at this time to the right or left foot.  Patient has not noticed any drainage through his bandage and has been getting his dressings changed as instructed.  Patient currently denies nausea, vomiting, fever, chills.  Objective: Vital signs in last 24 hours: Temp:  [97.5 F (36.4 C)-98.4 F (36.9 C)] 98 F (36.7 C) (07/07 0700) Pulse Rate:  [84-95] 88 (07/07 0700) Resp:  [16-18] 18 (07/07 0700) BP: (110-164)/(68-100) 151/94 (07/07 0700) SpO2:  [98 %-100 %] 99 % (07/07 0700) Last BM Date: 11/16/20  Intake/Output from previous day: 07/06 0701 - 07/07 0700 In: 240 [P.O.:240] Out: -  Intake/Output this shift: No intake/output data recorded.  Right foot incision line to the fifth metatarsal phalangeal joint resection area appears to be macerated, serous sanguinous drainage present, no purulence, no palpable fluctuance to the area.  Still has some fairly moderate erythema and edema present to the right dorsal lateral foot, does appear to be slowly improving.  Antibiotic beads today are seen at the T point of the incision and were removed today of this area.  No pain on palpation to the area and patient feels as though the erythema and edema steadily improving.  11/16/20   11/17/20 - TODAY   Left plantar lateral fifth metatarsal phalangeal joint wound appears to be stable and has a granular base overall and appears to be healing well.  Wound measures approximately 1.5 cm x 1 cm x 0.1 cm, no signs of infection present this area.  Right plantar fourth metatarsal phalange joint ulcerated area appears to be stable and healing well overall as well, appears to be getting smaller in size compared to office visit.  Measures approximately 3 cm x 1.2 cm x 0.1 cm, no signs of infection present to the  area.  Status post right fifth metatarsal phalangeal joint resection, left hallux partial amputation.  Lab Results:  Recent Labs    11/15/20 0502 11/16/20 0526  WBC 9.7 6.3  HGB 12.6* 13.4  HCT 37.5* 39.9  PLT 173 210    BMET Recent Labs    11/15/20 0502 11/16/20 0526  NA 135 138  K 3.8 3.7  CL 101 101  CO2 23 28  GLUCOSE 114* 122*  BUN 11 9  CREATININE 0.66 0.57*  CALCIUM 8.7* 9.4    PT/INR Recent Labs    11/14/20 1230  LABPROT 14.1  INR 1.1    ABG No results for input(s): PHART, HCO3 in the last 72 hours.  Invalid input(s): PCO2, PO2  Studies/Results: [place] EKG SITE RITE  Result Date: 11/16/2020 If Site Rite image not attached, placement could not be confirmed due to current cardiac rhythm.   Anti-infectives: Anti-infectives (From admission, onward)    Start     Dose/Rate Route Frequency Ordered Stop   11/16/20 0600  piperacillin-tazobactam (ZOSYN) IVPB 3.375 g        3.375 g 12.5 mL/hr over 240 Minutes Intravenous Every 8 hours 11/16/20 0124     11/15/20 0900  cefTRIAXone (ROCEPHIN) 2 g in sodium chloride 0.9 % 100 mL IVPB  Status:  Discontinued        2 g 200 mL/hr over 30 Minutes Intravenous Every 24 hours 11/14/20 1132 11/14/20 1400   11/15/20 0600  ceFEPIme (MAXIPIME) 2 g in sodium chloride 0.9 % 100  mL IVPB  Status:  Discontinued        2 g 200 mL/hr over 30 Minutes Intravenous Every 8 hours 11/14/20 1401 11/16/20 0101   11/14/20 2200  vancomycin (VANCOCIN) IVPB 1000 mg/200 mL premix  Status:  Discontinued        1,000 mg 200 mL/hr over 60 Minutes Intravenous Every 12 hours 11/14/20 1229 11/16/20 0101   11/14/20 1518  vancomycin (VANCOCIN) powder  Status:  Discontinued          As needed 11/14/20 1518 11/14/20 1540   11/14/20 1200  vancomycin (VANCOCIN) IVPB 1000 mg/200 mL premix        1,000 mg 200 mL/hr over 60 Minutes Intravenous  Once 11/14/20 1157 11/14/20 1318   11/14/20 0915  vancomycin (VANCOCIN) IVPB 1000 mg/200 mL premix        1,000  mg 200 mL/hr over 60 Minutes Intravenous  Once 11/14/20 0901 11/14/20 1040   11/14/20 0915  cefTRIAXone (ROCEPHIN) 2 g in sodium chloride 0.9 % 100 mL IVPB        2 g 200 mL/hr over 30 Minutes Intravenous  Once 11/14/20 0901 11/14/20 0945       Assessment/Plan: s/p Procedure(s): IRRIGATION AND DEBRIDEMENT EXTREMITY (Right) Assessment: Stable status post debridement right foot.  Plan:  -Patient seen and examined -Patient still is to have a fair amount of erythema and edema present to the dorsal lateral aspect right foot.  Appears to be slowly improving.  No palpable fluctuance accessible with examination today.  No purulence noted from the wound.  Fair amount of maceration present to the incision line.  No obvious dehiscence present.  Serous drainage present, once again no purulence, erythema and edema appears to be once again slowly improving. -Betadine and a sterile dressing applied to the right and left foot.  Continue to recommend daily changes.  Applied Betadine soaked gauze to all wounds followed by 4 x 4 gauze, ABD, Kerlix, Ace wrap with minimal compression.   -Path report came back positive for bone infection at the fifth metatarsal phalangeal joint.  Margins were not oriented apparently in the proper orientation so no comment was made on margin of amputation sites.  Culture report taken from abscess and fifth metatarsal phalangeal joint both positive for bacterial growth, both growing strep agalactiae. -Based on appearance of foot we will likely have to place on IV antibiotics.  Defer to infectious disease for further recommendations.  We will continue to monitor foot closely.  Vital signs stable at this time, afebrile, white blood cell count within normal limits. -Discussed with patient that if condition worsens may need further surgical intervention consisting of surgical debridement as well as revision partial fifth ray right foot amputation.  Discussed with patient that the area  appears to be slowly improving and that we could treat this conservatively still with further IV antibiotic therapy for several weeks and local wound care.  If condition worsens or infection does not appear to be resolving then may require further subsequent MRI and surgical intervention.  Patient understands and would like to proceed conservatively at this time. -We will check patient again tomorrow morning or during the lunch hour and if it once again appears to be improving or staying stable then podiatry team will likely sign off on patient for discharge with close follow-up in clinic early next week.  Patient should continue with daily dressing changes at home after discharge. -Patient may be weightbearing on the heels of both feet in his wedge  surgical shoes.   LOS: 3 days    Rosetta Posner, [facility] 11/17/2020

## 2020-11-17 NOTE — Progress Notes (Signed)
Mobility Specialist - Progress Note   11/17/20 1549  Mobility  Activity Contraindicated/medical hold  Mobility performed by Mobility specialist    Pt having sterile procedure at this time. Will attempt session another date/time as appropriate.    Filiberto Pinks Mobility Specialist 11/17/20, 3:49 PM

## 2020-11-17 NOTE — Treatment Plan (Signed)
Diagnosis: Rt foot infection/osteomyelitis Baseline Creatinine 0.57    Allergies  Allergen Reactions   Other Itching    Brown Paper towels    OPAT Orders Zosyn 13.5 grams IV as continuous infusion for 24 hours  Duration: 2 weeks - may need more End Date: 11/30/20  [facility] Care Per Protocol:including placement biopatch  Labs weekly on Monday while on IV antibiotics: __X CBC with differential _X_ CMP X ESR X CRP   _X__ Please leave PIC in place until doctor has seen patient or been notified  Fax weekly labs to [phone]  Clinic Follow Up Appt: 2weeks 11/29/20   Call [phone] with any questions

## 2020-11-17 NOTE — Plan of Care (Signed)

## 2020-11-17 NOTE — Progress Notes (Signed)
PHARMACY CONSULT NOTE FOR:  OUTPATIENT  PARENTERAL ANTIBIOTIC THERAPY (OPAT)  Indication: Foot infection with osteomyelitis Regimen: piperacillin/tazobactam 13.5g IV as continuous infusion over 24h hours End date: 11/30/2020  IV antibiotic discharge orders are pended. To discharging provider:  please sign these orders via discharge navigator,  Select New Orders & click on the button choice - Manage This Unsigned Work.     Thank you for allowing pharmacy to be a part of this patient's care.  Juliette Alcide, PharmD, BCPS.   Work Cell: [phone] 11/17/2020 4:22 PM

## 2020-11-17 NOTE — Progress Notes (Signed)
Physical Therapy Treatment Patient Details Name: Brendan Pruitt MRN: [id number] DOB: 1976-02-08 Today's Date: 11/17/2020    History of Present Illness Brendan Pruitt is a 44 y.o. male with medical history significant of hypertension, hyperlipidemia, diabetes mellitus, s/p of total amputation, tobacco abuse, alcohol abuse in remission (patient states that he stopped drinking alcohol several months ago), who presents with foot ulcer.  Patient has chronic ulceration on both feet for more than 6 months.  Patient has been seen by podiatrist, Dr. Excell Seltzer. s/p I&D right foot with excision of fifth metatarsal phalangeal joint 11/14/20.    PT Comments    Dons/doffs Darco shoes on his own.  Stands and is able to complete lap and stair training with min guard/supervision.  Steady with gait.   Follow Up Recommendations  No PT follow up     Equipment Recommendations  Rolling walker with 5" wheels    Recommendations for Other Services       Precautions / Restrictions Precautions Precautions: Fall Restrictions Weight Bearing Restrictions: Yes RLE Weight Bearing: Partial weight bearing RLE Partial Weight Bearing Percentage or Pounds: Heel WB with Darco shoes    Mobility  Bed Mobility               General bed mobility comments: patient received in recliner    Transfers   Equipment used: None                Ambulation/Gait Ambulation/Gait assistance: Supervision Gait Distance (Feet): 200 Feet Assistive device: Rolling walker (2 wheeled) Gait Pattern/deviations: Step-through pattern Gait velocity: WFL   General Gait Details: generally steady with Darco shoes   Stairs Stairs: Yes Stairs assistance: Min guard Stair Management: Two rails Number of Stairs: 3 General stair comments: steady   Wheelchair Mobility    Modified Rankin (Stroke Patients Only)       Balance Overall balance assessment: Modified Independent                                           Cognition Arousal/Alertness: Awake/alert Behavior During Therapy: WFL for tasks assessed/performed Overall Cognitive Status: Within Functional Limits for tasks assessed                                        Exercises      General Comments        Pertinent Vitals/Pain Pain Assessment: No/denies pain    Home Living                      Prior Function            PT Goals (current goals can now be found in the care plan section) Progress towards PT goals: Progressing toward goals    Frequency           PT Plan Current plan remains appropriate    Co-evaluation              AM-PAC PT "6 Clicks" Mobility   Outcome Measure  Help needed turning from your back to your side while in a flat bed without using bedrails?: None Help needed moving from lying on your back to sitting on the side of a flat bed without using bedrails?: None Help needed moving to and from a bed to a chair (  including a wheelchair)?: None Help needed standing up from a chair using your arms (e.g., wheelchair or bedside chair)?: None Help needed to walk in hospital room?: None Help needed climbing 3-5 steps with a railing? : None 6 Click Score: 24    End of Session   Activity Tolerance: Patient tolerated treatment well Patient left: in chair;with call bell/phone within reach Nurse Communication: Mobility status PT Visit Diagnosis: Difficulty in walking, not elsewhere classified (R26.2)     Time: 2482-5003 PT Time Calculation (min) (ACUTE ONLY): 9 min  Charges:  $Gait Training: 8-22 mins                    Danielle Dess, PTA 11/17/20, 1:25 PM 11/17/2020, 1:24 PM

## 2020-11-17 NOTE — TOC Progression Note (Signed)
Transition of Care [facility]) - CM/SW Discharge Note   Patient Details  Name: Brendan Pruitt MRN: [id number] Date of Birth: [birth year]  [facility] CM/SW Contact:  Chapman Fitch, RN Phone Number: 11/17/2020, 2:56 PM   Clinical Narrative:     Per MD anticipated DC tomorrow.  Pam with advanced infusion notified     Barriers to Discharge: Continued Medical Work up   Patient Goals and CMS Choice        Discharge Placement                       Discharge Plan and Services   Discharge Planning Services: CM Consult                                 Social Determinants of Health (SDOH) Interventions     Readmission Risk Interventions No flowsheet data found.

## 2020-11-17 NOTE — Discharge Instructions (Signed)
Podiatry discharge instructions: 1.  Change surgical dressings daily to both feet.  Apply Santyl to the wound on the left foot at the fifth metatarsal phalangeal joint and to the plantar aspect of the right foot.  Apply Betadine soaked gauze to the right foot amputation site.  To both feet apply 4 x 4 gauze to the wounds, ABD to the wound sites, gauze roll/Kerlix, Ace wrap with minimal compression. 2.  Take antibiotics as prescribed until gone. 3.  Continue partial weightbearing with surgical shoe is trying to avoid placing pressure on both forefeet. 4.  Follow-up with Dr. Alberteen Spindle in outpatient clinic within 1 week of discharge date. 5.  Monitor for signs of infection closely and if you notice any increased redness, swelling, pain, purulent discharge, nausea, vomiting, fever, chills then please seek medical attention more promptly.

## 2020-11-17 NOTE — Progress Notes (Signed)
1       [name] at [facility]   PATIENT NAME: Brendan Pruitt    MR#:  [id number]  Barbette Reichmann, MD  DATE OF BIRTH:  03-27-1976  SUBJECTIVE:  CHIEF COMPLAINT:   Chief Complaint  Patient presents with  . Wound Infection  Overall feeling better.  Blood pressure still running high.  Hoping to go home tomorrow REVIEW OF SYSTEMS:  Review of Systems  Constitutional:  Negative for diaphoresis, fever, malaise/fatigue and weight loss.  HENT:  Negative for ear discharge, ear pain, hearing loss, nosebleeds, sore throat and tinnitus.   Eyes:  Negative for blurred vision and pain.  Respiratory:  Negative for cough, hemoptysis, shortness of breath and wheezing.   Cardiovascular:  Negative for chest pain, palpitations, orthopnea and leg swelling.  Gastrointestinal:  Negative for abdominal pain, blood in stool, constipation, diarrhea, heartburn, nausea and vomiting.  Genitourinary:  Negative for dysuria, frequency and urgency.  Musculoskeletal:  Positive for joint pain. Negative for back pain and myalgias.  Skin:  Negative for itching and rash.  Neurological:  Negative for dizziness, tingling, tremors, focal weakness, seizures, weakness and headaches.  Psychiatric/Behavioral:  Negative for depression. The patient is not nervous/anxious.   DRUG ALLERGIES:   Allergies  Allergen Reactions  . Other Itching    Brown Paper towels   VITALS:  Blood pressure (!) 151/94, pulse 88, temperature 98 F (36.7 C), temperature source Oral, resp. rate 18, height 5\' 5"  (1.651 m), weight 96.6 kg, SpO2 99 %. PHYSICAL EXAMINATION:  Physical Exam 45 year old male lying in the bed comfortably without any acute distress Eyes pupils equal round reactive to light and accommodation Lungs clear to auscultation bilaterally, no wheezing rales rhonchi or crepitation Cardiovascular S1-S2 normal no murmur rales or gallop Abdomen soft, benign Neuro alert and oriented, nonfocal Skin: Bandages/dressing in  place.  Erythema present around the dorsal aspect of right foot LABORATORY PANEL:  Male CBC Recent Labs  Lab 11/16/20 0526  WBC 6.3  HGB 13.4  HCT 39.9  PLT 210    ------------------------------------------------------------------------------------------------------------------ Chemistries  Recent Labs  Lab 11/14/20 0236 11/15/20 0502 11/16/20 0526  NA 133*   < > 138  K 3.8   < > 3.7  CL 98   < > 101  CO2 24   < > 28  GLUCOSE 241*   < > 122*  BUN 14   < > 9  CREATININE 0.77   < > 0.57*  CALCIUM 9.3   < > 9.4  AST 29  --   --   ALT 30  --   --   ALKPHOS 128*  --   --   BILITOT 1.1  --   --    < > = values in this interval not displayed.    RADIOLOGY:  01/17/21 EKG SITE RITE  Result Date: 11/16/2020 If Site Rite image not attached, placement could not be confirmed due to current cardiac rhythm.  ASSESSMENT AND PLAN:  45 y.o. male with medical history significant of hypertension, hyperlipidemia, diabetes mellitus, s/p of total amputation, tobacco abuse, alcohol abuse in remission (patient states that he stopped drinking alcohol several months ago) admitted for infected foot ulcer  Principal Problem:   Diabetic foot infection (HCC) Active Problems:   Tobacco use disorder   Type 2 diabetes mellitus with hyperglycemia, without long-term current use of insulin (HCC)   Diabetic foot ulcer (HCC)   Sepsis (HCC)   Cellulitis of right foot   HTN (hypertension)  Diabetic neuropathy (HCC)  Sepsis due to diabetic foot ulcer -present on admission 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 ID  -Status post incision and drainage of right foot with excision of fifth metatarsal phalangeal joint on 7/4 by Dr. Alberteen Spindle -Podiatry has seen and done dressing changes today.  More than likely he will need IV antibiotics at discharge.  His wound is growing strep agalactiae.   - PICC line  to be placed today -Dr. Excell Seltzer evaluated the wound today and seem to be healing appropriately. -Weightbearing on the heels of both feet in his wedge surgical shoes per podiatry   Tobacco use disorder -Nicotine patch   Type 2 diabetes mellitus with hyperglycemia, without long-term current use of insulin (HCC): Recent A1c 7.9, poorly controlled.  Patient is taking metformin, Ozempic, Farxiga, glipizide. -SSI   HTN: -increase losartan for better BP control -IV hydralazine as needed   Diabetic neuropathy: -Continue Neurontin    Body mass index is 35.45 kg/m.  Net IO Since Admission: 3,704.55 mL [11/17/20 1520]      Status is: Inpatient  Remains inpatient appropriate because:Ongoing active pain requiring inpatient pain management, May need revision surgery or MRI before discharge per podiatry.  Outpatient antibiotic per ID  Dispo: The patient is from: Home              Anticipated d/c is to: Home              Patient currently is not medically stable to d/c.     Difficult to place patient No   DVT prophylaxis:       SCDs Start: 11/14/20 1213     Family Communication: "discussed with patient"   All the records are reviewed and case discussed with Care Management/Social Worker. Management plans discussed with the patient, podiatry, nursing and they are in agreement.  CODE STATUS: Full Code Level of care: Med-Surg  TOTAL TIME TAKING CARE OF THIS PATIENT: 35 minutes.   More than 50% of the time was spent in counseling/coordination of care: YES  POSSIBLE D/C IN 1 DAYS, DEPENDING ON CLINICAL CONDITION.  And antibiotic plans per ID/podiatry   Delfino Lovett M.D on 11/17/2020 at 3:20 PM  [facility]   CC: Primary care physician; Barbette Reichmann, MD  Note: This dictation was prepared with Dragon dictation along with smaller phrase technology. Any transcriptional errors that result from this process are unintentional.

## 2020-11-18 DIAGNOSIS — L97516 Non-pressure chronic ulcer of other part of right foot with bone involvement without evidence of necrosis: Secondary | ICD-10-CM

## 2020-11-18 DIAGNOSIS — E11621 Type 2 diabetes mellitus with foot ulcer: Secondary | ICD-10-CM

## 2020-11-18 DIAGNOSIS — R52 Pain, unspecified: Secondary | ICD-10-CM

## 2020-11-18 LAB — GLUCOSE, CAPILLARY
Glucose-Capillary: 148 mg/dL — ABNORMAL HIGH (ref 70–99)
Glucose-Capillary: 214 mg/dL — ABNORMAL HIGH (ref 70–99)

## 2020-11-18 MED ORDER — PIPERACILLIN-TAZOBACTAM IV (FOR PTA / DISCHARGE USE ONLY): 13.5000 g | INTRAVENOUS | 0 refills | Status: AC

## 2020-11-18 NOTE — Progress Notes (Signed)
Foot and ankle surgery progress note  4 Days Post-Op   Subjective/Chief Complaint: Brendan Pruitt is a 45 year old male who presents today resting in bedside chair comfortably eating breakfast.  Patient has minimal to no pain at this time to the right or left foot.  Patient has not noticed any drainage through his bandage and has been getting his dressings changed as instructed.  Patient currently denies nausea, vomiting, fever, chills.  Objective: Vital signs in last 24 hours: Temp:  [98.1 F (36.7 C)-98.5 F (36.9 C)] 98.1 F (36.7 C) (07/08 0750) Pulse Rate:  [85-95] 95 (07/08 0750) Resp:  [18-20] 18 (07/08 0750) BP: (120-146)/(84-98) 120/84 (07/08 0750) SpO2:  [96 %-100 %] 100 % (07/08 0750) Last BM Date: 11/17/20  Intake/Output from previous day: 07/07 0701 - 07/08 0700 In: 850 [P.O.:840; I.V.:10] Out: 0  Intake/Output this shift: No intake/output data recorded.  Right foot incision line to the fifth metatarsal phalangeal joint resection area appears to be macerated, serous sanguinous drainage present, no purulence, no palpable fluctuance to the area.  Erythema and edema appears to be improving overall to the right dorsal lateral foot, appears to be fairly mild today.  Antibiotic beads today are seen at the T point of the incision and were removed today of this area.  No pain on palpation to the area and patient feels as though the erythema and edema steadily improving.  11/18/20-TODAY    11/17/20    Left plantar lateral fifth metatarsal phalangeal joint wound appears to be stable and has a granular base overall and appears to be healing well.  Wound measures approximately 1.5 cm x 1 cm x 0.1 cm, no signs of infection present this area.  Right plantar fourth metatarsal phalange joint ulcerated area appears to be stable and healing well overall as well, appears to be getting smaller in size compared to office visit.  Measures approximately 3 cm x 1.2 cm x 0.1 cm, no signs of  infection present to the area.  Status post right fifth metatarsal phalangeal joint resection, left hallux partial amputation.  Lab Results:  Recent Labs    11/16/20 0526  WBC 6.3  HGB 13.4  HCT 39.9  PLT 210    BMET Recent Labs    11/16/20 0526  NA 138  K 3.7  CL 101  CO2 28  GLUCOSE 122*  BUN 9  CREATININE 0.57*  CALCIUM 9.4    PT/INR No results for input(s): LABPROT, INR in the last 72 hours.  ABG No results for input(s): PHART, HCO3 in the last 72 hours.  Invalid input(s): PCO2, PO2  Studies/Results: [place] EKG SITE RITE  Result Date: 11/16/2020 If Site Rite image not attached, placement could not be confirmed due to current cardiac rhythm.   Anti-infectives: Anti-infectives (From admission, onward)    Start     Dose/Rate Route Frequency Ordered Stop   11/18/20 0000  piperacillin-tazobactam (ZOSYN) IVPB        13.5 g Intravenous Continuous 11/18/20 0816 11/30/20 2359   11/16/20 0600  piperacillin-tazobactam (ZOSYN) IVPB 3.375 g        3.375 g 12.5 mL/hr over 240 Minutes Intravenous Every 8 hours 11/16/20 0124     11/15/20 0900  cefTRIAXone (ROCEPHIN) 2 g in sodium chloride 0.9 % 100 mL IVPB  Status:  Discontinued        2 g 200 mL/hr over 30 Minutes Intravenous Every 24 hours 11/14/20 1132 11/14/20 1400   11/15/20 0600  ceFEPIme (MAXIPIME) 2 g in  sodium chloride 0.9 % 100 mL IVPB  Status:  Discontinued        2 g 200 mL/hr over 30 Minutes Intravenous Every 8 hours 11/14/20 1401 11/16/20 0101   11/14/20 2200  vancomycin (VANCOCIN) IVPB 1000 mg/200 mL premix  Status:  Discontinued        1,000 mg 200 mL/hr over 60 Minutes Intravenous Every 12 hours 11/14/20 1229 11/16/20 0101   11/14/20 1518  vancomycin (VANCOCIN) powder  Status:  Discontinued          As needed 11/14/20 1518 11/14/20 1540   11/14/20 1200  vancomycin (VANCOCIN) IVPB 1000 mg/200 mL premix        1,000 mg 200 mL/hr over 60 Minutes Intravenous  Once 11/14/20 1157 11/14/20 1318   11/14/20 0915   vancomycin (VANCOCIN) IVPB 1000 mg/200 mL premix        1,000 mg 200 mL/hr over 60 Minutes Intravenous  Once 11/14/20 0901 11/14/20 1040   11/14/20 0915  cefTRIAXone (ROCEPHIN) 2 g in sodium chloride 0.9 % 100 mL IVPB        2 g 200 mL/hr over 30 Minutes Intravenous  Once 11/14/20 0901 11/14/20 0945       Assessment/Plan: s/p Procedure(s): IRRIGATION AND DEBRIDEMENT EXTREMITY (Right) Assessment: Stable status post debridement right foot.  Plan:  -Patient seen and examined -Patient's erythema and edema has improved since procedure and appears to be fairly mild today.  Appears to be steadily improving.  Remove some of the antibiotic beads that were present and sticking out of the wound.  Wound appears to be stable at this time, no fluctuance or purulence expressed. -Betadine soaked gauze and a sterile dressing applied to the right and left foot.  Continue to recommend daily changes.  Applied Betadine soaked gauze to all wounds followed by 4 x 4 gauze, ABD, Kerlix, Ace wrap with minimal compression.  Instructions placed in chart for patient for dressing changes to both feet. -Path report came back positive for bone infection at the fifth metatarsal phalangeal joint.  Margins were not oriented apparently in the proper orientation so no comment was made on margin of amputation sites.  Culture report taken from abscess and fifth metatarsal phalangeal joint both positive for bacterial growth, both growing strep agalactiae. -Based on appearance of foot we will likely have to place on IV antibiotics.  Defer to infectious disease for further recommendations. Vital signs stable at this time, afebrile, white blood cell count within normal limits. -Discussed with patient that if condition worsens after conservative care and local wound care in an outpatient basis then may need further surgical intervention consisting of surgical debridement as well as revision partial fifth ray right foot amputation.   -Patient may be weightbearing on the heels of both feet in his wedge surgical shoes. -Patient appears to be stable at this time for discharge.  Podiatry team to sign off at this time once deemed medically stable from medicine standpoint.  Patient to follow-up within 1 week of discharge date in clinic for close follow-up.  Discharge instructions placed in chart.   LOS: 4 days    Rosetta Posner, [facility], [facility] 11/18/2020

## 2020-11-18 NOTE — Plan of Care (Signed)

## 2020-11-18 NOTE — Progress Notes (Signed)
ID Pt doing well No complaints Had PICc placed yesterday Has learnt how to administer IV at home  O/e Awake and alert Chest b/l air entry HS s1s2 Abd soft Rt foot  wound seen thru pictures    Lateral aspect of the foot- surgical site is slightly macerated due to vanco beads discharge Erythema and swelling better   Labs CBC Latest Ref Rng & Units 11/16/2020 11/15/2020 11/14/2020  WBC 4.0 - 10.5 K/uL 6.3 9.7 11.5(H)  Hemoglobin 13.0 - 17.0 g/dL 16.1 12.6(L) 14.2  Hematocrit 39.0 - 52.0 % 39.9 37.5(L) 41.7  Platelets 150 - 400 K/uL 210 173 164    CMP Latest Ref Rng & Units 11/16/2020 11/15/2020 11/14/2020  Glucose 70 - 99 mg/dL 096(E) 454(U) 981(X)  BUN 6 - 20 mg/dL 9 11 14   Creatinine 0.61 - 1.24 mg/dL ) 9.14(N 8.29  Sodium 135 - 145 mmol/L 138 135 133(L)  Potassium 3.5 - 5.1 mmol/L 3.7 3.8 3.8  Chloride 98 - 111 mmol/L 101 101 98  CO2 22 - 32 mmol/L 28 23 24   Calcium 8.9 - 10.3 mg/dL 9.4 5.62) 9.3  Total Protein 6.5 - 8.1 g/dL - - 8.3(H)  Total Bilirubin 0.3 - 1.2 mg/dL - - 1.1  Alkaline Phos 38 - 126 U/L - - 128(H)  AST 15 - 41 U/L - - 29  ALT 0 - 44 U/L - - 30     Micro- GBS - ( had pseudomonas, enterobacter and GBS in culture from 10/12/20 and wa son cipro before he came to the hospital)  Impression/recommendation Diabetic foot infection with left 5th toe infection- s/p amputation of the metatarsal phalangeal joint and not the whole toe Has osteomyelitis He will go home on 2 weeks of IV zosyn- may need more If needed Will follow him as OP

## 2020-11-18 NOTE — Progress Notes (Signed)
Physical Therapy Treatment Patient Details Name: Brendan Pruitt MRN: [id number] DOB: 02/10/1976 Today's Date: 11/18/2020    History of Present Illness Brendan Pruitt is a 45 y.o. male with medical history significant of hypertension, hyperlipidemia, diabetes mellitus, s/p of total amputation, tobacco abuse, alcohol abuse in remission (patient states that he stopped drinking alcohol several months ago), who presents with foot ulcer.  Patient has chronic ulceration on both feet for more than 6 months.  Patient has been seen by podiatrist, Dr. Luana Shu. s/p I&D right foot with excision of fifth metatarsal phalangeal joint 11/14/20.    PT Comments    Pt tolerated treatment well today. Pt able to improve overall ambulation distance, activity tolerance, and assist levels (via AD). Pt was grossly Ind with STS transfers and was provided supervision for gait with IV pole for steadying, however, pt able to be mod I with gait. Pt continues to demonstrate good safety awareness and good balance. Pt verbalized understanding of all education; see exercises section for details. Pt has currently met all goals set at IE and therefore will be DC from acute PT services. Pt with orders to DC home today. Please re-consult with any changes in status.      Follow Up Recommendations  No PT follow up     Equipment Recommendations  Rolling walker with 5" wheels    Recommendations for Other Services       Precautions / Restrictions Precautions Precautions: Fall Precaution Comments: mod fall Restrictions Weight Bearing Restrictions: Yes RLE Weight Bearing: Partial weight bearing RLE Partial Weight Bearing Percentage or Pounds: heel WB with darco shoes    Mobility  Bed Mobility     General bed mobility comments: patient received in recliner    Transfers Overall transfer level: Independent Equipment used: None     General transfer comment: Pt demonstrates STS transfer independently with bil darco shoes  donned, without significant balance deficits and good safety awareness.   Ambulation/Gait Ambulation/Gait assistance: Supervision Gait Distance (Feet): 320 Feet Assistive device: IV Pole       General Gait Details: Supervision to ambulate with bil darco shoes donned and U UE assist from IV pole for steadying. Pt demonstrates early reciprocal gait with adherence to heel WB precautions. No obvious gait/balance deficits noted. No LOB noted with increased cognitive load and head turns.     Balance Overall balance assessment: Modified Independent                  Cognition Arousal/Alertness: Awake/alert Behavior During Therapy: WFL for tasks assessed/performed Overall Cognitive Status: Within Functional Limits for tasks assessed                 Exercises Other Exercises Other Exercises: Pt able to participate in transfers and gait with bil darco shoes donned and U UE support on IV pole for steadying. Pt grossly Ind-supervision for mobility for safety. Demonstrates good safety awareness. Other Exercises: Pt educated regarding PT role/POC, safety with mobility, daily skin checks and when to contact MD, DM affecting healing time, appropriate Darco shoe management, and edema management (elevation and ankle pumps). He verbalized understanding of all education.        Pertinent Vitals/Pain Pain Assessment: No/denies pain     PT Goals (current goals can now be found in the care plan section) Acute Rehab PT Goals Patient Stated Goal: to return home PT Goal Formulation: With patient Time For Goal Achievement: 11/29/20 Potential to Achieve Goals: Good Progress towards PT goals: Goals met/education completed, patient  discharged from PT    Frequency    Min 2X/week      PT Plan Current plan remains appropriate;Other (comment) (acute PT to DC pt today)       AM-PAC PT "6 Clicks" Mobility   Outcome Measure  Help needed turning from your back to your side while in a  flat bed without using bedrails?: None Help needed moving from lying on your back to sitting on the side of a flat bed without using bedrails?: None Help needed moving to and from a bed to a chair (including a wheelchair)?: None Help needed standing up from a chair using your arms (e.g., wheelchair or bedside chair)?: None Help needed to walk in hospital room?: None Help needed climbing 3-5 steps with a railing? : None 6 Click Score: 24    End of Session Equipment Utilized During Treatment: Gait belt Activity Tolerance: Patient tolerated treatment well Patient left: in chair;with call bell/phone within reach Nurse Communication: Mobility status PT Visit Diagnosis: Difficulty in walking, not elsewhere classified (R26.2)     Time: 6045-4098 PT Time Calculation (min) (ACUTE ONLY): 16 min  Charges:  $Therapeutic Activity: 8-22 mins                     Herminio Commons, PT, DPT 9:59 AM,11/18/20

## 2020-11-18 NOTE — TOC Transition Note (Signed)
Transition of Care [facility]) - CM/SW Discharge Note   Patient Details  Name: Braedyn Kauk MRN: [id number] Date of Birth: 1976-02-05  [facility] CM/SW Contact:  Chapman Fitch, RN Phone Number: 11/18/2020, 2:07 PM   Clinical Narrative:    Patient to discharge home today Pam with [facility] to provide education at bedside prior to discharge.  Pam to coordinate [place] for RN care at home IV  antibiotic therapy and incision management   RW has already been delivered to room      Barriers to Discharge: Continued Medical Work up   Patient Goals and CMS Choice        Discharge Placement                       Discharge Plan and Services   Discharge Planning Services: CM Consult                                 Social Determinants of Health (SDOH) Interventions     Readmission Risk Interventions No flowsheet data found.

## 2020-11-19 LAB — CULTURE, BLOOD (ROUTINE X 2)
Culture: NO GROWTH
Culture: NO GROWTH

## 2020-11-19 LAB — AEROBIC/ANAEROBIC CULTURE W GRAM STAIN (SURGICAL/DEEP WOUND)

## 2020-11-19 NOTE — Discharge Summary (Signed)
Brendan Pruitt at [place] NAME: Brendan Pruitt    MR#:  [id number]  DATE OF BIRTH:  1976-03-17  DATE OF ADMISSION:  11/14/2020   ADMITTING PHYSICIAN: Ivor Costa, MD  DATE OF DISCHARGE: 11/18/2020  6:12 PM  PRIMARY CARE PHYSICIAN: Tracie Harrier, MD   ADMISSION DIAGNOSIS:  Pain [R52] Cellulitis of right lower extremity [L03.115] Diabetic foot infection ([place]) [I37.048, L08.9] Sepsis, due to unspecified organism, unspecified whether acute organ dysfunction present ([place]) [A41.9] DISCHARGE DIAGNOSIS:  Principal Problem:   Diabetic foot infection ([place]) Active Problems:   Tobacco use disorder   Type 2 diabetes mellitus with hyperglycemia, without long-term current use of insulin (HCC)   Diabetic foot ulcer ([place])   Sepsis ([place])   Cellulitis of right foot   HTN (hypertension)   Diabetic neuropathy ([place])   Pain  SECONDARY DIAGNOSIS:   Past Medical History:  Diagnosis Date  . Acute osteomyelitis ([place]) 10/2019   phalanx left foot  . Cellulitis and abscess of foot 10/2019   left.  using betadine wet to dry dressings.  treated with multiple antibiotics. + pseudomonas  . Diabetes mellitus without complication ([place])   . Elevated hemoglobin A1c 10/2019   results are 12.5%  . Gangrene ([place]) 10/2019   left toe  . Hypertension   . Neuromuscular disorder [facility])    diabetic neuropathy   HOSPITAL COURSE:  45 y.o. 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 -Status post incision and drainage  of right foot with excision of fifth metatarsal phalangeal joint on 7/4 by Dr. Cleda Mccreedy -wound is healing appropriately dressing changes per podiatry. -Weightbearing on the heels of both feet in his wedge surgical shoes per podiatry   Tobacco use disorder -counseled   Type 2 diabetes mellitus with hyperglycemia, without long-term current use of insulin ([place]): Recent A1c 7.9, poorly controlled. SSI while in the hospital. D/Ced on home regimen   HTN: -continue losartan   Diabetic neuropathy: -Continue Neurontin DISCHARGE CONDITIONS:  stable CONSULTS OBTAINED:   DRUG ALLERGIES:   Allergies  Allergen Reactions  . Other Itching    Brown Paper towels   DISCHARGE MEDICATIONS:   Allergies as of 11/18/2020       Reactions   Other Itching   Owens Shark Paper towels        Medication List     STOP taking these medications    ciprofloxacin 500 MG tablet Commonly known as: CIPRO   lactulose 10 GM/15ML solution Commonly known as: CHRONULAC   lubiprostone 24 MCG capsule Commonly known as: Amitiza   povidone-Iodine 5 % Soln topical solution Commonly known as: BETADINE       TAKE these medications    acetaminophen 500 MG tablet Commonly known as: TYLENOL Take 500 mg by mouth every 6 (six) hours as needed for mild pain, moderate pain or headache.   dapagliflozin propanediol 10 MG Tabs tablet Commonly known as: FARXIGA Take 10 mg by mouth daily.   gabapentin 300 MG capsule Commonly known as: NEURONTIN Take 1 capsule by mouth 2 (two) times  daily. What changed: Another medication with the same name was removed. Continue taking this medication, and follow the directions you see here.   glipiZIDE 10 MG 24 hr tablet Commonly known as: GLUCOTROL XL Take 10 mg by mouth 2 (two) times daily.   losartan 25 MG tablet Commonly known as: COZAAR Take 25 mg by mouth daily.   metFORMIN 1000 MG tablet Commonly known as: GLUCOPHAGE Take 1,000 mg by mouth 2 (two) times daily with a  meal.   OMEGA 3 PO Take 1,080 mg by mouth in the morning and at bedtime.   ONE-A-DAY MENS PO Take by mouth daily.   Ozempic (1 MG/DOSE) 4 MG/3ML Sopn Generic drug: Semaglutide (1 MG/DOSE) Inject 1 mg into the skin every Sunday.   piperacillin-tazobactam  IVPB Commonly known as: ZOSYN Inject 13.5 g into the vein continuous for 12 days. piperacillin/tazobactam 13.5g IV as continuous infusion over 24h hours Indication:  Foot infection with osteomyelitis First Dose: Yes Last Day of Therapy:  11/30/2020 Labs - Once weekly (Monday):  CBC/D, CMP, ESR and CRP Fax weekly labs to [phone] Do NOT remove PICC until seen by ID and gives orders to remove Method of administration: Elastomeric (Continuous infusion) Method of administration may be changed at the discretion of home infusion pharmacist based upon assessment of the patient and/or caregiver's ability to self-administer the medication ordered.               Discharge Care Instructions  (From admission, onward)           Start     Ordered   11/18/20 0000  Change dressing on IV access line weekly and PRN  (Home infusion instructions - [facility] )        07 /08/22 0816   11/18/20 0000  Discharge wound care:       Comments: Daily dressing changes per podiatry   11/18/20 1128           DISCHARGE INSTRUCTIONS:  1.  Change surgical dressings daily to both feet.  Apply Santyl to the wound on the left foot at the fifth metatarsal phalangeal joint and to the plantar aspect of the right foot.  Apply Betadine soaked gauze to the right foot amputation site.  To both feet apply 4 x 4 gauze to the wounds, ABD to the wound sites, gauze roll/Kerlix, Ace wrap with minimal compression. 2.  Take antibiotics as prescribed until gone. 3.  Continue partial weightbearing with surgical shoe is trying to avoid placing pressure on both forefeet. 4.  Follow-up with Dr. Cleda Mccreedy in outpatient clinic within 1 week of discharge  date. 5.  Monitor for signs of infection closely and if you notice any increased redness, swelling, pain, purulent discharge, nausea, vomiting, fever, chills then please seek medical attention more promptly. DIET:  Regular diet DISCHARGE CONDITION:  Good ACTIVITY:  Activity as tolerated -Weightbearing on the heels of both feet in his wedge surgical shoes per podiatry OXYGEN:  Home Oxygen: No.  Oxygen Delivery: room air DISCHARGE LOCATION:  Home with HHPT, RN   If you experience worsening of your admission symptoms, develop shortness of breath, life threatening emergency, suicidal or homicidal thoughts you must seek medical attention immediately by calling 911 or calling your MD immediately  if symptoms less severe.  You Must read complete instructions/literature along with all the possible adverse reactions/side effects for all the Medicines you take and that have been prescribed to you. Take any new Medicines after you have completely  understood and accpet all the possible adverse reactions/side effects.   Please note  You were cared for by a hospitalist during your hospital stay. If you have any questions about your discharge medications or the care you received while you were in the hospital after you are discharged, you can call the unit and asked to speak with the hospitalist on call if the hospitalist that took care of you is not available. Once you are discharged, your primary care physician will handle any further medical issues. Please note that NO REFILLS for any discharge medications will be authorized once you are discharged, as it is imperative that you return to your primary care physician (or establish a relationship with a primary care physician if you do not have one) for your aftercare needs so that they can reassess your need for medications and monitor your lab values.    On the day of Discharge:  VITAL SIGNS:  Blood pressure 120/84, pulse 95, temperature 98.1 F (36.7  C), temperature source Oral, resp. rate 18, height 5' 5"  (1.651 m), weight 96.6 kg, SpO2 100 %. PHYSICAL EXAMINATION:  GENERAL:  45 y.o.-year-old patient lying in the bed with no acute distress.  EYES: Pupils equal, round, reactive to light and accommodation. No scleral icterus. Extraocular muscles intact.  HEENT: Head atraumatic, normocephalic. Oropharynx and nasopharynx clear.  NECK:  Supple, no jugular venous distention. No thyroid enlargement, no tenderness.  LUNGS: Normal breath sounds bilaterally, no wheezing, rales,rhonchi or crepitation. No use of accessory muscles of respiration.  CARDIOVASCULAR: S1, S2 normal. No murmurs, rubs, or gallops.  ABDOMEN: Soft, non-tender, non-distended. Bowel sounds present. No organomegaly or mass.  EXTREMITIES: No pedal edema, cyanosis, or clubbing.  NEUROLOGIC: Cranial nerves II through XII are intact. Muscle strength 5/5 in all extremities. Sensation intact. Gait not checked.  PSYCHIATRIC: The patient is alert and oriented x 3.  SKIN: No obvious rash, lesion, or ulcer.  DATA REVIEW:   CBC Recent Labs  Lab 11/16/20 0526  WBC 6.3  HGB 13.4  HCT 39.9  PLT 210    Chemistries  Recent Labs  Lab 11/14/20 0236 11/15/20 0502 11/16/20 0526  NA 133*   < > 138  K 3.8   < > 3.7  CL 98   < > 101  CO2 24   < > 28  GLUCOSE 241*   < > 122*  BUN 14   < > 9  CREATININE 0.77   < > 0.57*  CALCIUM 9.3   < > 9.4  AST 29  --   --   ALT 30  --   --   ALKPHOS 128*  --   --   BILITOT 1.1  --   --    < > = values in this interval not displayed.     Outpatient follow-up  Follow-up Information     Sharlotte Alamo, DPM. Schedule an appointment as soon as possible for a visit in 1 week(s).   Specialty: Podiatry Why: F/U appt 07/15 @ 2pm Contact information: [place] [address] [phone]         Tracie Harrier, MD. Schedule an appointment as soon as possible for a visit on 11/22/2020.   Specialty: Internal Medicine Why:  [facility] Discharge F/UP - 07/12 @ 1:45pm Contact information: [place] [address] [phone]         Sharlotte Alamo, DPM .   Specialty: Podiatry Contact information: [place]  [place] [address] [phone]                 30 Day Unplanned Readmission Risk Score    Flowsheet Row ED to Hosp-Admission (Discharged) from 11/14/2020 in [place]  30 Day Unplanned Readmission Risk Score (%) 10.02 Filed at 11/18/2020 1600       This score is the patient's risk of an unplanned readmission within 30 days of being discharged (0 -100%). The score is based on dignosis, age, lab data, medications, orders, and past utilization.   Low:  0-14.9   Medium: 15-21.9   High: 22-29.9   Extreme: 30 and above           Management plans discussed with the patient, family and they are in agreement.  CODE STATUS: Prior   TOTAL TIME TAKING CARE OF THIS PATIENT: 45 minutes.    Max Sane M.D on 11/19/2020 at 6:03 PM  [facility]   CC: Primary care physician; Tracie Harrier, MD   Note: This dictation was prepared with Dragon dictation along with smaller phrase technology. Any transcriptional errors that result from this process are unintentional.

## 2020-11-20 LAB — AEROBIC/ANAEROBIC CULTURE W GRAM STAIN (SURGICAL/DEEP WOUND)

## 2020-11-29 ENCOUNTER — Other Ambulatory Visit: Payer: Self-pay

## 2020-11-29 ENCOUNTER — Ambulatory Visit: Payer: Commercial Managed Care - PPO | Attending: Infectious Diseases | Admitting: Infectious Diseases

## 2020-11-29 VITALS — BP 116/83 | HR 83 | Temp 97.5°F | Resp 16 | Ht 65.0 in | Wt 207.0 lb

## 2020-11-29 DIAGNOSIS — Z7984 Long term (current) use of oral hypoglycemic drugs: Secondary | ICD-10-CM | POA: Diagnosis not present

## 2020-11-29 DIAGNOSIS — E11628 Type 2 diabetes mellitus with other skin complications: Secondary | ICD-10-CM | POA: Diagnosis not present

## 2020-11-29 DIAGNOSIS — Z89422 Acquired absence of other left toe(s): Secondary | ICD-10-CM | POA: Diagnosis not present

## 2020-11-29 DIAGNOSIS — Z79899 Other long term (current) drug therapy: Secondary | ICD-10-CM | POA: Diagnosis not present

## 2020-11-29 DIAGNOSIS — Z833 Family history of diabetes mellitus: Secondary | ICD-10-CM | POA: Insufficient documentation

## 2020-11-29 DIAGNOSIS — L089 Local infection of the skin and subcutaneous tissue, unspecified: Secondary | ICD-10-CM | POA: Diagnosis not present

## 2020-11-29 DIAGNOSIS — M868X7 Other osteomyelitis, ankle and foot: Secondary | ICD-10-CM | POA: Insufficient documentation

## 2020-11-29 DIAGNOSIS — E1140 Type 2 diabetes mellitus with diabetic neuropathy, unspecified: Secondary | ICD-10-CM | POA: Diagnosis present

## 2020-11-29 DIAGNOSIS — Z7901 Long term (current) use of anticoagulants: Secondary | ICD-10-CM | POA: Insufficient documentation

## 2020-11-29 DIAGNOSIS — B999 Unspecified infectious disease: Secondary | ICD-10-CM | POA: Diagnosis not present

## 2020-11-29 DIAGNOSIS — Z87891 Personal history of nicotine dependence: Secondary | ICD-10-CM | POA: Insufficient documentation

## 2020-11-29 DIAGNOSIS — I10 Essential (primary) hypertension: Secondary | ICD-10-CM | POA: Diagnosis not present

## 2020-11-29 NOTE — Patient Instructions (Signed)
You are here for follow up of the rt foot infection- will continue IV zosyn for another week and then switch you to pill augmentin- will see you back in 2 weeks

## 2020-11-29 NOTE — Progress Notes (Signed)
NAME: Brendan Pruitt  DOB: [birth year]  MRN: [id number]  Date/Time: 11/29/2020 12:15 PM  Subjective:   ? Brendan Pruitt is a 45 y.o. male with a history of DM, , HTN, was recently in the hospital between 7/4-11/18/20 with rt foot infection and underwent metatarsal head amputation. The bone had osteo and the proximal margins could not be identified because of orientation- so we decided to treat him 2-4 weeks of Iv zosyn. He is completing 2 weeks tomorrow and is doing oay The sutures are still there and there is an area of maceration with a hole he says. His wife does the dressing. He has no fever  or chills He is doing IV everyday by himself   Past Medical History:  Diagnosis Date   Acute osteomyelitis ([place]) 10/2019   phalanx left foot   Cellulitis and abscess of foot 10/2019   left.  using betadine wet to dry dressings.  treated with multiple antibiotics. + pseudomonas   Diabetes mellitus without complication (HCC)    Elevated hemoglobin A1c 10/2019   results are 12.5%   Gangrene ([place]) 10/2019   left toe   Hypertension    Neuromuscular disorder ([place])    diabetic neuropathy    Past Surgical History:  Procedure Laterality Date   AMPUTATION TOE Left 11/06/2019   Procedure: AMPUTATION TOE IPJ LEFT HALLUX;  Surgeon: Caroline More, DPM;  Location: [facility];  Service: Podiatry;  Laterality: Left;   CATARACT EXTRACTION W/PHACO Right 09/21/2020   Procedure: CATARACT EXTRACTION PHACO AND INTRAOCULAR LENS PLACEMENT ([place]) RIGHT DIABETIC;  Surgeon: Leandrew Koyanagi, MD;  Location: [facility];  Service: Ophthalmology;  Laterality: Right;  Diabetic - oral meds cde 9.48 01:48.8 minutes 8.7%   CATARACT EXTRACTION W/PHACO Left 10/12/2020   Procedure: CATARACT EXTRACTION PHACO AND INTRAOCULAR LENS PLACEMENT ([facility]) LEFT DIABETIC 8.16 01:37.0;  Surgeon: Leandrew Koyanagi, MD;  Location: [facility];  Service: Ophthalmology;  Laterality: Left;  Diabetic - oral meds   I & D EXTREMITY  Right 11/14/2020   Procedure: IRRIGATION AND DEBRIDEMENT EXTREMITY;  Surgeon: Sharlotte Alamo, DPM;  Location: [facility];  Service: Podiatry;  Laterality: Right;   [place] SURGERY  2010   no metal   UMBILICAL HERNIA REPAIR  2010   mesh     Social History   Socioeconomic History   Marital status: Married    Spouse name: Brendan Pruitt   Number of children: Not on file   Years of education: Not on file   Highest education level: Not on file  Occupational History   Not on file  Tobacco Use   Smoking status: Former    Packs/day: 0.25    Years: 4.00    Pack years: 1.00    Types: Cigarettes    Quit date: 2016    Years since quitting: 6.5   Smokeless tobacco: Never  Vaping Use   Vaping Use: Never used  Substance and Sexual Activity   Alcohol use: Yes    Alcohol/week: 20.0 standard drinks    Types: 20 Cans of beer per week    Comment: does not drink much lately   Drug use: No   Sexual activity: Yes  Other Topics Concern   Not on file  Social History Narrative   Patient lives with his wife and 5 children.   Social Determinants of Health   Financial Resource Strain: Not on file  Food Insecurity: Not on file  Transportation Needs: Not on file  Physical Activity: Not on file  Stress: Not  on file  Social Connections: Not on file  Intimate Partner Violence: Not on file    Family History  Problem Relation Age of Onset   Diabetes Mother    Diabetes Sister    Allergies  Allergen Reactions   Other Itching    Owens Shark Paper towels   I? Current Outpatient Medications  Medication Sig Dispense Refill   acetaminophen (TYLENOL) 500 MG tablet Take 500 mg by mouth every 6 (six) hours as needed for mild pain, moderate pain or headache.     dapagliflozin propanediol (FARXIGA) 10 MG TABS tablet Take 10 mg by mouth daily.     gabapentin (NEURONTIN) 300 MG capsule Take 1 capsule by mouth 2 (two) times daily.     glipiZIDE (GLUCOTROL XL) 10 MG 24 hr tablet Take 10 mg by mouth 2 (two) times  daily.     losartan (COZAAR) 25 MG tablet Take 25 mg by mouth daily.     metFORMIN (GLUCOPHAGE) 1000 MG tablet Take 1,000 mg by mouth 2 (two) times daily with a meal.      Multiple Vitamin (ONE-A-DAY MENS PO) Take by mouth daily.     Omega-3 Fatty Acids (OMEGA 3 PO) Take 1,080 mg by mouth in the morning and at bedtime.     OZEMPIC, 1 MG/DOSE, 4 MG/3ML SOPN Inject 1 mg into the skin every Sunday.      piperacillin-tazobactam (ZOSYN) IVPB Inject 13.5 g into the vein continuous for 12 days. piperacillin/tazobactam 13.5g IV as continuous infusion over 24h hours Indication:  Foot infection with osteomyelitis First Dose: Yes Last Day of Therapy:  11/30/2020 Labs - Once weekly (Monday):  CBC/D, CMP, ESR and CRP Fax weekly labs to [phone] Do NOT remove PICC until seen by ID and gives orders to remove Method of administration: Elastomeric (Continuous infusion) Method of administration may be changed at the discretion of home infusion pharmacist based upon assessment of the patient and/or caregiver's ability to self-administer the medication ordered. 13 Units 0   No current facility-administered medications for this visit.     Abtx:  Anti-infectives (From admission, onward)    None       REVIEW OF SYSTEMS:  Const: negative fever, negative chills, negative weight loss Eyes: negative diplopia or visual changes, negative eye pain ENT: negative coryza, negative sore throat Resp: negative cough, hemoptysis, dyspnea Cards: negative for chest pain, palpitations, lower extremity edema GU: negative for frequency, dysuria and hematuria GI: Negative for abdominal pain, diarrhea, bleeding, constipation Skin: negative for rash and pruritus Heme: negative for easy bruising and gum/nose bleeding MS: negative for myalgias, arthralgias, back pain and muscle weakness Neurolo:negative for headaches, dizziness, vertigo, memory problems  Psych: negative for feelings of anxiety, depression  Endocrine:  negative for thyroid, diabetes Allergy/Immunology- negative for any medication or food allergies ? Objective:  VITALS:  BP 116/83   Pulse 83   Temp (!) 97.5 F (36.4 C)   Resp 16   Ht 5' 5"  (1.651 m)   Wt 207 lb (93.9 kg)   SpO2 100%   BMI 34.45 kg/m  PHYSICAL EXAM:  General: Alert, cooperative, no distress, appears stated age.  Head: Normocephalic, without obvious abnormality, atraumatic. Eyes: Conjunctivae clear, anicteric sclerae. Pupils are equal ENT Nares normal. No drainage or sinus tenderness. Lips, mucosa, and tongue normal. No Thrush Neck: Supple, symmetrical, no adenopathy, thyroid: non tender no carotid bruit and no JVD. Back: No CVA tenderness. Lungs: Clear to auscultation bilaterally. No Wheezing or Rhonchi. No rales. Heart: Regular  rate and rhythm, no murmur, rub or gallop. Abdomen: Soft, non-tender,not distended. Bowel sounds normal. No masses Extremities: rt foot Surgical site  rt lateral margin has some maceration-  Sutures present- no edema or erythema There is a superficial ulcer on the plantar aspect    Left foot- superfical wound lateral margin Skin: No rashes or lesions. Or bruising Lymph: Cervical, supraclavicular normal. Neurologic: Grossly non-focal Pertinent Labs Lab Results CBC    Component Value Date/Time   WBC 6.3 11/16/2020 0526   RBC 4.02 (L) 11/16/2020 0526   HGB 13.4 11/16/2020 0526   HCT 39.9 11/16/2020 0526   PLT 210 11/16/2020 0526   MCV 99.3 11/16/2020 0526   MCH 33.3 11/16/2020 0526   MCHC 33.6 11/16/2020 0526   RDW 12.6 11/16/2020 0526   LYMPHSABS 2.0 11/14/2020 0236   MONOABS 1.5 (H) 11/14/2020 0236   EOSABS 0.1 11/14/2020 0236   BASOSABS 0.1 11/14/2020 0236    CMP Latest Ref Rng & Units 11/16/2020 11/15/2020 11/14/2020  Glucose 70 - 99 mg/dL 122(H) 114(H) 241(H)  BUN 6 - 20 mg/dL 9 11 14   Creatinine 0.61 - 1.24 mg/dL 0.57(L) 0.66 0.77  Sodium 135 - 145 mmol/L 138 135 133(L)  Potassium 3.5 - 5.1 mmol/L 3.7 3.8 3.8   Chloride 98 - 111 mmol/L 101 101 98  CO2 22 - 32 mmol/L 28 23 24   Calcium 8.9 - 10.3 mg/dL 9.4 8.7(L) 9.3  Total Protein 6.5 - 8.1 g/dL - - 8.3(H)  Total Bilirubin 0.3 - 1.2 mg/dL - - 1.1  Alkaline Phos 38 - 126 U/L - - 128(H)  AST 15 - 41 U/L - - 29  ALT 0 - 44 U/L - - 30      Microbiology: GBS?  Impression/Recommendation ? 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 Then will do Po augmentin for 2 more weeks following that   DM- on farciga, metformin,  Diabetic neuropathy  HTN-on lisinopril ? ___________________________________________________ Discussed with patient,  Follow up 2 weeks Note:  This document was prepared using Dragon voice recognition software and may include unintentional dictation errors.

## 2020-12-06 ENCOUNTER — Other Ambulatory Visit: Payer: Self-pay | Admitting: Internal Medicine

## 2020-12-06 ENCOUNTER — Other Ambulatory Visit: Payer: Self-pay | Admitting: Infectious Diseases

## 2020-12-06 ENCOUNTER — Telehealth: Payer: Self-pay

## 2020-12-06 DIAGNOSIS — M5432 Sciatica, left side: Secondary | ICD-10-CM

## 2020-12-06 MED ORDER — AMOXICILLIN-POT CLAVULANATE 875-125 MG PO TABS: 1.0000 | ORAL_TABLET | Freq: Two times a day (BID) | ORAL | 1 refills | Status: DC

## 2020-12-06 NOTE — Telephone Encounter (Signed)
Per Dr Rivka Safer we will PULL PICC after todays last dose. He will start oral antibiotic tomorrow. I have alerted [facility] and they will send HH to PULL PICC>

## 2020-12-06 NOTE — Telephone Encounter (Signed)
Patient calling to confirm that today is last dose of IV Abx.Would like to pick up oral abx today from [facility]. Advised him that we will send in oral abx and he should start them the day after his last IV dose of abx. I did also agree to call and confirm PULL PICC orders with [facility]. His last dose should be today and PULL PICC after that and then oral meds will start tomorrow. Advised I will call him back if anything has changed otherwise check with [facility] his PM and await HH to PULL PICC.

## 2020-12-06 NOTE — Progress Notes (Signed)
Pt is completing zosyn today and PICc will be removed- He will start Augmentin 1 tablet BID for 2 weeks- follow up next week with me

## 2020-12-13 ENCOUNTER — Encounter: Payer: Self-pay | Admitting: Infectious Diseases

## 2020-12-13 ENCOUNTER — Other Ambulatory Visit: Payer: Self-pay

## 2020-12-13 ENCOUNTER — Ambulatory Visit: Payer: Commercial Managed Care - PPO | Attending: Infectious Diseases | Admitting: Infectious Diseases

## 2020-12-13 ENCOUNTER — Telehealth: Payer: Self-pay | Admitting: Infectious Diseases

## 2020-12-13 VITALS — BP 134/92 | HR 70 | Temp 97.9°F | Resp 16 | Ht 65.0 in | Wt 207.0 lb

## 2020-12-13 DIAGNOSIS — E11628 Type 2 diabetes mellitus with other skin complications: Secondary | ICD-10-CM

## 2020-12-13 DIAGNOSIS — Z87891 Personal history of nicotine dependence: Secondary | ICD-10-CM | POA: Insufficient documentation

## 2020-12-13 DIAGNOSIS — Z7984 Long term (current) use of oral hypoglycemic drugs: Secondary | ICD-10-CM | POA: Insufficient documentation

## 2020-12-13 DIAGNOSIS — I10 Essential (primary) hypertension: Secondary | ICD-10-CM | POA: Insufficient documentation

## 2020-12-13 DIAGNOSIS — Z09 Encounter for follow-up examination after completed treatment for conditions other than malignant neoplasm: Secondary | ICD-10-CM | POA: Insufficient documentation

## 2020-12-13 DIAGNOSIS — Z79899 Other long term (current) drug therapy: Secondary | ICD-10-CM | POA: Insufficient documentation

## 2020-12-13 DIAGNOSIS — L089 Local infection of the skin and subcutaneous tissue, unspecified: Secondary | ICD-10-CM

## 2020-12-13 DIAGNOSIS — M86172 Other acute osteomyelitis, left ankle and foot: Secondary | ICD-10-CM | POA: Insufficient documentation

## 2020-12-13 DIAGNOSIS — Z89422 Acquired absence of other left toe(s): Secondary | ICD-10-CM | POA: Diagnosis not present

## 2020-12-13 DIAGNOSIS — E1140 Type 2 diabetes mellitus with diabetic neuropathy, unspecified: Secondary | ICD-10-CM | POA: Insufficient documentation

## 2020-12-13 DIAGNOSIS — Z792 Long term (current) use of antibiotics: Secondary | ICD-10-CM | POA: Diagnosis not present

## 2020-12-13 NOTE — Progress Notes (Signed)
NAME: Brendan Pruitt  DOB: 20-Apr-1976  MRN: [id number]  Date/Time: 12/13/2020 11:26 AM  Subjective:   ?Follow-up visit for foot wound. Last seen on 11/29/2020 Since then he has completed his IV Zosyn and PICC line has been removed.  He has started p.o. Augmentin on 12/06/2020 for a 2-week period. He has seen podiatrist and sutures have been removed.  His next appointment is this Friday. Is concerned that the skin on the sole of his foot is bluish in nature. He said yesterday he walked for a long distance and also waited in line at [facility] to renew his wife's passport.     The following is taken from the notes from the last visit.Brendan Pruitt is a 45 y.o. male with a history of DM, , HTN, was recently in the hospital between 7/4-11/18/20 with rt foot infection and underwent metatarsal head amputation. The bone had osteo and the proximal margins could not be identified because of orientation-so he has been treated with 3 weeks of IVs Zosyn followed by 2 weeks of p.o. Augmentin.  Past Medical History:  Diagnosis Date   Acute osteomyelitis (HCC) 10/2019   phalanx left foot   Cellulitis and abscess of foot 10/2019   left.  using betadine wet to dry dressings.  treated with multiple antibiotics. 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   Hypertension    Neuromuscular disorder (HCC)    diabetic neuropathy    Past Surgical History:  Procedure Laterality Date   AMPUTATION TOE Left 11/06/2019   Procedure: AMPUTATION TOE IPJ LEFT HALLUX;  Surgeon: Rosetta Posner, DPM;  Location: [facility];  Service: Podiatry;  Laterality: Left;   CATARACT EXTRACTION W/PHACO Right 09/21/2020   Procedure: CATARACT EXTRACTION PHACO AND INTRAOCULAR LENS PLACEMENT (IOC) RIGHT DIABETIC;  Surgeon: Lockie Mola, MD;  Location: [facility];  Service: Ophthalmology;  Laterality: Right;  Diabetic  - oral meds cde 9.48 01:48.8 minutes 8.7%   CATARACT EXTRACTION W/PHACO Left 10/12/2020   Procedure: CATARACT EXTRACTION PHACO AND INTRAOCULAR LENS PLACEMENT (IOC) LEFT DIABETIC 8.16 01:37.0;  Surgeon: Lockie Mola, MD;  Location: [facility];  Service: Ophthalmology;  Laterality: Left;  Diabetic - oral meds   I & D EXTREMITY Right 11/14/2020   Procedure: IRRIGATION AND DEBRIDEMENT EXTREMITY;  Surgeon: Linus Galas, DPM;  Location: [facility];  Service: Podiatry;  Laterality: Right;   LUMBAR DISC SURGERY  2010   no metal   UMBILICAL HERNIA REPAIR  2010   mesh     Social History   Socioeconomic History   Marital status: Married    Spouse name: Publishing rights manager   Number of children: Not on file   Years of education: Not on file   Highest education level: Not on file  Occupational History   Not on file  Tobacco Use   Smoking status: Former    Packs/day: 0.25    Years: 4.00    Pack years: 1.00    Types: Cigarettes    Quit date: 2016    Years since quitting: 6.5   Smokeless tobacco: Never  Vaping Use   Vaping Use: Never used  Substance and Sexual Activity   Alcohol use: Yes    Alcohol/week: 20.0 standard drinks    Types: 20 Cans of beer per week    Comment: does not drink much lately   Drug use: No  Sexual activity: Yes  Other Topics Concern   Not on file  Social History Narrative   Patient lives with his wife and 5 children.   Social Determinants of Health   Financial Resource Strain: Not on file  Food Insecurity: Not on file  Transportation Needs: Not on file  Physical Activity: Not on file  Stress: Not on file  Social Connections: Not on file  Intimate Partner Violence: Not on file    Family History  Problem Relation Age of Onset   Diabetes Mother    Diabetes Sister    Allergies  Allergen Reactions   Other Itching    Manson Passey Paper towels   I? Current Outpatient Medications  Medication Sig Dispense Refill   acetaminophen (TYLENOL) 500 MG tablet Take  500 mg by mouth every 6 (six) hours as needed for mild pain, moderate pain or headache.     amoxicillin-clavulanate (AUGMENTIN) 875-125 MG tablet Take 1 tablet by mouth 2 (two) times daily. 28 tablet 1   dapagliflozin propanediol (FARXIGA) 10 MG TABS tablet Take 10 mg by mouth daily.     gabapentin (NEURONTIN) 300 MG capsule Take 1 capsule by mouth 2 (two) times daily.     glipiZIDE (GLUCOTROL XL) 10 MG 24 hr tablet Take 10 mg by mouth 2 (two) times daily.     levothyroxine (SYNTHROID) 25 MCG tablet Take 25 mcg by mouth daily.     losartan (COZAAR) 25 MG tablet Take 25 mg by mouth daily.     metFORMIN (GLUCOPHAGE) 1000 MG tablet Take 1,000 mg by mouth 2 (two) times daily with a meal.      Multiple Vitamin (ONE-A-DAY MENS PO) Take by mouth daily.     Omega-3 Fatty Acids (OMEGA 3 PO) Take 1,080 mg by mouth in the morning and at bedtime.     OZEMPIC, 1 MG/DOSE, 4 MG/3ML SOPN Inject 1 mg into the skin every Sunday.      SANTYL ointment Apply topically.     traMADol (ULTRAM) 50 MG tablet Take 50 mg by mouth 3 (three) times daily as needed.     No current facility-administered medications for this visit.     Abtx:  Anti-infectives (From admission, onward)    None       REVIEW OF SYSTEMS:  Const: negative fever, negative chills, negative weight loss Eyes: negative diplopia or visual changes, negative eye pain ENT: negative coryza, negative sore throat Resp: negative cough, hemoptysis, dyspnea Cards: negative for chest pain, palpitations, lower extremity edema GU: negative for frequency, dysuria and hematuria GI: Negative for abdominal pain, diarrhea, bleeding, constipation Skin: negative for rash and pruritus Heme: negative for easy bruising and gum/nose bleeding MS: negative for myalgias, arthralgias, back pain and muscle weakness Neurolo:negative for headaches, dizziness, vertigo, memory problems  Psych: negative for feelings of anxiety, depression  Endocrine: negative for thyroid,  diabetes Allergy/Immunology- negative for any medication or food allergies ? Objective:  VITALS:  BP (!) 134/92   Pulse 70   Temp 97.9 F (36.6 C) (Temporal)   Resp 16   Ht 5\' 5"  (1.651 m)   Wt 207 lb (93.9 kg)   SpO2 98%   BMI 34.45 kg/m  PHYSICAL EXAM:  General: Alert, cooperative, no distress, appears stated age.  Head: Normocephalic, without obvious abnormality, atraumatic. Eyes: Conjunctivae clear, anicteric sclerae. Pupils are equal ENT Nares normal. No drainage or sinus tenderness. Lips, mucosa, and tongue normal. No Thrush Neck: Supple, symmetrical, no adenopathy, thyroid: non tender no carotid bruit and  no JVD. Back: No CVA tenderness. Lungs: Clear to auscultation bilaterally. No Wheezing or Rhonchi. No rales. Heart: Regular rate and rhythm, no murmur, rub or gallop. Abdomen: Soft, non-tender,not distended. Bowel sounds normal. No masses Extremities: rt foot Surgical site is healing well.  There is some callus at the site of the surgery.  There is no erythema or discharge.  There is a superficial ulcer on the plantar aspect           Left foot- superfical wound lateral margin.  Even though there is a bluish tinge to the superficial aspect of the skin this does not look like gangrene. Skin: No rashes or lesions. Or bruising Lymph: Cervical, supraclavicular normal. Neurologic: Grossly non-focal Pertinent Labs Lab Results CBC    Component Value Date/Time   WBC 6.3 11/16/2020 0526   RBC 4.02 (L) 11/16/2020 0526   HGB 13.4 11/16/2020 0526   HCT 39.9 11/16/2020 0526   PLT 210 11/16/2020 0526   MCV 99.3 11/16/2020 0526   MCH 33.3 11/16/2020 0526   MCHC 33.6 11/16/2020 0526   RDW 12.6 11/16/2020 0526   LYMPHSABS 2.0 11/14/2020 0236   MONOABS 1.5 (H) 11/14/2020 0236   EOSABS 0.1 11/14/2020 0236   BASOSABS 0.1 11/14/2020 0236    CMP Latest Ref Rng & Units 11/16/2020 11/15/2020 11/14/2020  Glucose 70 - 99 mg/dL 952(W) 413(K) 440(N)  BUN 6 - 20 mg/dL 9 11 14    Creatinine 0.61 - 1.24 mg/dL ) 0.27(O 5.36  Sodium 135 - 145 mmol/L 138 135 133(L)  Potassium 3.5 - 5.1 mmol/L 3.7 3.8 3.8  Chloride 98 - 111 mmol/L 101 101 98  CO2 22 - 32 mmol/L 28 23 24   Calcium 8.9 - 10.3 mg/dL 9.4 6.44) 9.3  Total Protein 6.5 - 8.1 g/dL - - 8.3(H)  Total Bilirubin 0.3 - 1.2 mg/dL - - 1.1  Alkaline Phos 38 - 126 U/L - - 128(H)  AST 15 - 41 U/L - - 29  ALT 0 - 44 U/L - - 30      Microbiology: GBS?  Impression/Recommendation ? 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 Augmentin.  May not need more than this.   DM- on farciga, metformin,  Diabetic neuropathy  HTN-on lisinopril ? ___________________________________________________ Discussed with patient,  He has a follow-up with the podiatrist on Friday.  We will see him as needed. Note:  This document was prepared using Dragon voice recognition software and may include unintentional dictation errors.

## 2020-12-13 NOTE — Patient Instructions (Signed)
You are here for follow up - for the rt foot infection- after completing IV zosyn for 3 weeks you are now on 2 weeks of  Po augmentin. You have an appt with Dr.Cline on Friday- will folow up after that with him

## 2020-12-14 ENCOUNTER — Ambulatory Visit
Admission: RE | Admit: 2020-12-14 | Discharge: 2020-12-14 | Disposition: A | Discharge status: Home / Self Care | Payer: Commercial Managed Care - PPO | Source: Ambulatory Visit | Attending: Internal Medicine | Admitting: Internal Medicine

## 2020-12-14 DIAGNOSIS — M5432 Sciatica, left side: Secondary | ICD-10-CM | POA: Insufficient documentation

## 2020-12-14 MED ORDER — GADOBUTROL 1 MMOL/ML IV SOLN
9.0000 mL | Freq: Once | INTRAVENOUS | Status: AC | PRN
  Administered 2020-12-14: 9 mL via INTRAVENOUS

## 2020-12-15 ENCOUNTER — Telehealth: Payer: Self-pay | Admitting: Infectious Diseases

## 2020-12-15 NOTE — Telephone Encounter (Signed)
Pt emailed his medical records to myself and they were sent to him by [facility] medical records. He asked for records to be printed off due to medical records not being in office. He needed help with this due to his disability. 12/13/2020

## 2020-12-28 ENCOUNTER — Encounter: Payer: Self-pay | Admitting: Emergency Medicine

## 2020-12-28 ENCOUNTER — Other Ambulatory Visit: Payer: Self-pay

## 2020-12-28 DIAGNOSIS — E1169 Type 2 diabetes mellitus with other specified complication: Principal | ICD-10-CM | POA: Diagnosis present

## 2020-12-28 DIAGNOSIS — Z833 Family history of diabetes mellitus: Secondary | ICD-10-CM

## 2020-12-28 DIAGNOSIS — Z79890 Hormone replacement therapy (postmenopausal): Secondary | ICD-10-CM

## 2020-12-28 DIAGNOSIS — E669 Obesity, unspecified: Secondary | ICD-10-CM | POA: Diagnosis present

## 2020-12-28 DIAGNOSIS — Z89412 Acquired absence of left great toe: Secondary | ICD-10-CM

## 2020-12-28 DIAGNOSIS — E1142 Type 2 diabetes mellitus with diabetic polyneuropathy: Secondary | ICD-10-CM | POA: Diagnosis present

## 2020-12-28 DIAGNOSIS — Z79899 Other long term (current) drug therapy: Secondary | ICD-10-CM

## 2020-12-28 DIAGNOSIS — Z87891 Personal history of nicotine dependence: Secondary | ICD-10-CM

## 2020-12-28 DIAGNOSIS — L270 Generalized skin eruption due to drugs and medicaments taken internally: Secondary | ICD-10-CM | POA: Diagnosis present

## 2020-12-28 DIAGNOSIS — Z6833 Body mass index (BMI) 33.0-33.9, adult: Secondary | ICD-10-CM

## 2020-12-28 DIAGNOSIS — M86171 Other acute osteomyelitis, right ankle and foot: Secondary | ICD-10-CM | POA: Diagnosis present

## 2020-12-28 DIAGNOSIS — E785 Hyperlipidemia, unspecified: Secondary | ICD-10-CM | POA: Diagnosis present

## 2020-12-28 DIAGNOSIS — E1151 Type 2 diabetes mellitus with diabetic peripheral angiopathy without gangrene: Secondary | ICD-10-CM | POA: Diagnosis present

## 2020-12-28 DIAGNOSIS — B965 Pseudomonas (aeruginosa) (mallei) (pseudomallei) as the cause of diseases classified elsewhere: Secondary | ICD-10-CM | POA: Diagnosis present

## 2020-12-28 DIAGNOSIS — E039 Hypothyroidism, unspecified: Secondary | ICD-10-CM | POA: Diagnosis present

## 2020-12-28 DIAGNOSIS — E11621 Type 2 diabetes mellitus with foot ulcer: Secondary | ICD-10-CM | POA: Diagnosis present

## 2020-12-28 DIAGNOSIS — Z20822 Contact with and (suspected) exposure to covid-19: Secondary | ICD-10-CM | POA: Diagnosis present

## 2020-12-28 DIAGNOSIS — I10 Essential (primary) hypertension: Secondary | ICD-10-CM | POA: Diagnosis present

## 2020-12-28 DIAGNOSIS — Z91048 Other nonmedicinal substance allergy status: Secondary | ICD-10-CM

## 2020-12-28 DIAGNOSIS — Z7984 Long term (current) use of oral hypoglycemic drugs: Secondary | ICD-10-CM

## 2020-12-28 DIAGNOSIS — L97512 Non-pressure chronic ulcer of other part of right foot with fat layer exposed: Secondary | ICD-10-CM | POA: Diagnosis present

## 2020-12-28 DIAGNOSIS — E11628 Type 2 diabetes mellitus with other skin complications: Secondary | ICD-10-CM | POA: Diagnosis present

## 2020-12-28 DIAGNOSIS — I96 Gangrene, not elsewhere classified: Secondary | ICD-10-CM | POA: Diagnosis not present

## 2020-12-28 LAB — COMPREHENSIVE METABOLIC PANEL
ALT: 37 U/L (ref 0–44)
AST: 32 U/L (ref 15–41)
Albumin: 4.1 g/dL (ref 3.5–5.0)
Alkaline Phosphatase: 98 U/L (ref 38–126)
Anion gap: 9 (ref 5–15)
BUN: 10 mg/dL (ref 6–20)
CO2: 25 mmol/L (ref 22–32)
Calcium: 9.1 mg/dL (ref 8.9–10.3)
Chloride: 100 mmol/L (ref 98–111)
Creatinine, Ser: 0.8 mg/dL (ref 0.61–1.24)
GFR, Estimated: >60 mL/min (ref >60)
Glucose, Bld: 151 mg/dL — ABNORMAL HIGH (ref 70–99)
Potassium: 4 mmol/L (ref 3.5–5.1)
Sodium: 134 mmol/L — ABNORMAL LOW (ref 135–145)
Total Bilirubin: 0.9 mg/dL (ref 0.3–1.2)
Total Protein: 7.5 g/dL (ref 6.5–8.1)

## 2020-12-28 LAB — CBC WITH DIFFERENTIAL/PLATELET
Abs Immature Granulocytes: 0.04 10*3/uL (ref 0.00–0.07)
Basophils Absolute: 0 10*3/uL (ref 0.0–0.1)
Basophils Relative: 0 %
Eosinophils Absolute: 0.2 10*3/uL (ref 0.0–0.5)
Eosinophils Relative: 2 %
HCT: 38.4 % — ABNORMAL LOW (ref 39.0–52.0)
Hemoglobin: 13.4 g/dL (ref 13.0–17.0)
Immature Granulocytes: 0 %
Lymphocytes Relative: 28 %
Lymphs Abs: 2.9 10*3/uL (ref 0.7–4.0)
MCH: 32.9 pg (ref 26.0–34.0)
MCHC: 34.9 g/dL (ref 30.0–36.0)
MCV: 94.3 fL (ref 80.0–100.0)
Monocytes Absolute: 0.7 10*3/uL (ref 0.1–1.0)
Monocytes Relative: 7 %
Neutro Abs: 6.3 10*3/uL (ref 1.7–7.7)
Neutrophils Relative %: 63 %
Platelets: 232 10*3/uL (ref 150–400)
RBC: 4.07 MIL/uL — ABNORMAL LOW (ref 4.22–5.81)
RDW: 12.7 % (ref 11.5–15.5)
WBC: 10.2 10*3/uL (ref 4.0–10.5)
nRBC: 0 % (ref 0.0–0.2)

## 2020-12-28 LAB — LACTIC ACID, PLASMA: Lactic Acid, Venous: 0.8 mmol/L (ref 0.5–1.9)

## 2020-12-28 NOTE — ED Triage Notes (Addendum)
Pt arrived via POV with reports of infection to R foot, pt seen at podiatry today, was told he had an infection to foot and was referred to ED for further evaluation.  Pt denies any pain, has hx of diabetes with neuropathy.   Foot currently wrapped in ace bandage from podiatry office. Dr. Alberteen Spindle is podiatrist.  Pt had XR at podiatry office that showed continued osteomyelitis in 5th metatarsal

## 2020-12-29 ENCOUNTER — Inpatient Hospital Stay: Payer: Commercial Managed Care - PPO

## 2020-12-29 ENCOUNTER — Emergency Department: Payer: Commercial Managed Care - PPO

## 2020-12-29 ENCOUNTER — Inpatient Hospital Stay
Admission: EM | Admit: 2020-12-29 | Discharge: 2021-01-03 | DRG: 617 | Disposition: A | Discharge status: Home / Self Care | Payer: Commercial Managed Care - PPO | Attending: Internal Medicine | Admitting: Internal Medicine

## 2020-12-29 DIAGNOSIS — L97512 Non-pressure chronic ulcer of other part of right foot with fat layer exposed: Secondary | ICD-10-CM | POA: Diagnosis present

## 2020-12-29 DIAGNOSIS — Z20822 Contact with and (suspected) exposure to covid-19: Secondary | ICD-10-CM | POA: Diagnosis present

## 2020-12-29 DIAGNOSIS — Z833 Family history of diabetes mellitus: Secondary | ICD-10-CM | POA: Diagnosis not present

## 2020-12-29 DIAGNOSIS — E119 Type 2 diabetes mellitus without complications: Secondary | ICD-10-CM

## 2020-12-29 DIAGNOSIS — Z7984 Long term (current) use of oral hypoglycemic drugs: Secondary | ICD-10-CM | POA: Diagnosis not present

## 2020-12-29 DIAGNOSIS — Z89412 Acquired absence of left great toe: Secondary | ICD-10-CM | POA: Diagnosis present

## 2020-12-29 DIAGNOSIS — E1169 Type 2 diabetes mellitus with other specified complication: Principal | ICD-10-CM

## 2020-12-29 DIAGNOSIS — M86171 Other acute osteomyelitis, right ankle and foot: Secondary | ICD-10-CM

## 2020-12-29 DIAGNOSIS — Z91048 Other nonmedicinal substance allergy status: Secondary | ICD-10-CM | POA: Diagnosis not present

## 2020-12-29 DIAGNOSIS — B965 Pseudomonas (aeruginosa) (mallei) (pseudomallei) as the cause of diseases classified elsewhere: Secondary | ICD-10-CM | POA: Diagnosis present

## 2020-12-29 DIAGNOSIS — L270 Generalized skin eruption due to drugs and medicaments taken internally: Secondary | ICD-10-CM | POA: Diagnosis present

## 2020-12-29 DIAGNOSIS — Z87891 Personal history of nicotine dependence: Secondary | ICD-10-CM | POA: Diagnosis not present

## 2020-12-29 DIAGNOSIS — I96 Gangrene, not elsewhere classified: Secondary | ICD-10-CM

## 2020-12-29 DIAGNOSIS — I10 Essential (primary) hypertension: Secondary | ICD-10-CM | POA: Diagnosis present

## 2020-12-29 DIAGNOSIS — M869 Osteomyelitis, unspecified: Secondary | ICD-10-CM

## 2020-12-29 DIAGNOSIS — E11621 Type 2 diabetes mellitus with foot ulcer: Secondary | ICD-10-CM

## 2020-12-29 DIAGNOSIS — Z09 Encounter for follow-up examination after completed treatment for conditions other than malignant neoplasm: Secondary | ICD-10-CM

## 2020-12-29 DIAGNOSIS — R059 Cough, unspecified: Secondary | ICD-10-CM

## 2020-12-29 DIAGNOSIS — E669 Obesity, unspecified: Secondary | ICD-10-CM | POA: Diagnosis present

## 2020-12-29 DIAGNOSIS — E785 Hyperlipidemia, unspecified: Secondary | ICD-10-CM | POA: Diagnosis present

## 2020-12-29 DIAGNOSIS — Z79890 Hormone replacement therapy (postmenopausal): Secondary | ICD-10-CM | POA: Diagnosis not present

## 2020-12-29 DIAGNOSIS — Z79899 Other long term (current) drug therapy: Secondary | ICD-10-CM | POA: Diagnosis not present

## 2020-12-29 DIAGNOSIS — E039 Hypothyroidism, unspecified: Secondary | ICD-10-CM | POA: Diagnosis present

## 2020-12-29 DIAGNOSIS — I5031 Acute diastolic (congestive) heart failure: Secondary | ICD-10-CM | POA: Diagnosis not present

## 2020-12-29 DIAGNOSIS — E1151 Type 2 diabetes mellitus with diabetic peripheral angiopathy without gangrene: Secondary | ICD-10-CM | POA: Diagnosis present

## 2020-12-29 DIAGNOSIS — L089 Local infection of the skin and subcutaneous tissue, unspecified: Secondary | ICD-10-CM | POA: Diagnosis not present

## 2020-12-29 DIAGNOSIS — E11628 Type 2 diabetes mellitus with other skin complications: Secondary | ICD-10-CM | POA: Diagnosis present

## 2020-12-29 DIAGNOSIS — L02611 Cutaneous abscess of right foot: Secondary | ICD-10-CM

## 2020-12-29 DIAGNOSIS — Z6833 Body mass index (BMI) 33.0-33.9, adult: Secondary | ICD-10-CM | POA: Diagnosis not present

## 2020-12-29 DIAGNOSIS — E1142 Type 2 diabetes mellitus with diabetic polyneuropathy: Secondary | ICD-10-CM | POA: Diagnosis present

## 2020-12-29 DIAGNOSIS — E1140 Type 2 diabetes mellitus with diabetic neuropathy, unspecified: Secondary | ICD-10-CM | POA: Diagnosis present

## 2020-12-29 LAB — CREATININE, SERUM
Creatinine, Ser: 0.62 mg/dL (ref 0.61–1.24)
GFR, Estimated: >60 mL/min (ref >60)

## 2020-12-29 LAB — CBC
HCT: 38.6 % — ABNORMAL LOW (ref 39.0–52.0)
Hemoglobin: 13.5 g/dL (ref 13.0–17.0)
MCH: 33 pg (ref 26.0–34.0)
MCHC: 35 g/dL (ref 30.0–36.0)
MCV: 94.4 fL (ref 80.0–100.0)
Platelets: 216 10*3/uL (ref 150–400)
RBC: 4.09 MIL/uL — ABNORMAL LOW (ref 4.22–5.81)
RDW: 12.6 % (ref 11.5–15.5)
WBC: 6.1 10*3/uL (ref 4.0–10.5)
nRBC: 0 % (ref 0.0–0.2)

## 2020-12-29 LAB — HEMOGLOBIN A1C
Hgb A1c MFr Bld: 8.1 % — ABNORMAL HIGH (ref 4.8–5.6)
Mean Plasma Glucose: 185.77 mg/dL

## 2020-12-29 LAB — C-REACTIVE PROTEIN: CRP: 1 mg/dL — ABNORMAL HIGH (ref <1.0)

## 2020-12-29 LAB — ABO/RH: ABO/RH(D): O POS

## 2020-12-29 LAB — SURGICAL PCR SCREEN
MRSA, PCR: NEGATIVE
Staphylococcus aureus: NEGATIVE

## 2020-12-29 LAB — GLUCOSE, CAPILLARY: Glucose-Capillary: 121 mg/dL — ABNORMAL HIGH (ref 70–99)

## 2020-12-29 LAB — CBG MONITORING, ED
Glucose-Capillary: 94 mg/dL (ref 70–99)
Glucose-Capillary: 121 mg/dL — ABNORMAL HIGH (ref 70–99)
Glucose-Capillary: 133 mg/dL — ABNORMAL HIGH (ref 70–99)

## 2020-12-29 LAB — RESP PANEL BY RT-PCR (FLU A&B, COVID) ARPGX2
Influenza A by PCR: NEGATIVE
Influenza B by PCR: NEGATIVE
SARS Coronavirus 2 by RT PCR: NEGATIVE

## 2020-12-29 LAB — TYPE AND SCREEN
ABO/RH(D): O POS
Antibody Screen: NEGATIVE

## 2020-12-29 LAB — PROTIME-INR
INR: 1 (ref 0.8–1.2)
Prothrombin Time: 13.6 seconds (ref 11.4–15.2)

## 2020-12-29 LAB — TROPONIN I (HIGH SENSITIVITY)
Troponin I (High Sensitivity): 2 ng/L (ref <18)
Troponin I (High Sensitivity): 3 ng/L (ref <18)

## 2020-12-29 LAB — PROCALCITONIN: Procalcitonin: <0.1 ng/mL

## 2020-12-29 MED ORDER — VANCOMYCIN HCL IN DEXTROSE 1-5 GM/200ML-% IV SOLN
1000.0000 mg | Freq: Two times a day (BID) | INTRAVENOUS | Status: DC
  Administered 2020-12-29 – 2021-01-01 (×7): 1000 mg via INTRAVENOUS
  Filled 2020-12-29 (×9): qty 200

## 2020-12-29 MED ORDER — PIPERACILLIN-TAZOBACTAM 3.375 G IVPB
3.3750 g | Freq: Three times a day (TID) | INTRAVENOUS | Status: AC
  Administered 2020-12-29 – 2020-12-30 (×6): 3.375 g via INTRAVENOUS
  Filled 2020-12-29 (×6): qty 50

## 2020-12-29 MED ORDER — MUPIROCIN 2 % EX OINT
1.0000 "application " | TOPICAL_OINTMENT | Freq: Two times a day (BID) | CUTANEOUS | Status: AC
  Administered 2020-12-29 – 2021-01-03 (×10): 1 via NASAL
  Filled 2020-12-29: qty 22

## 2020-12-29 MED ORDER — ONDANSETRON HCL 4 MG/2ML IJ SOLN: 4.0000 mg | Freq: Four times a day (QID) | INTRAMUSCULAR | Status: DC | PRN

## 2020-12-29 MED ORDER — ONDANSETRON HCL 4 MG PO TABS: 4.0000 mg | ORAL_TABLET | Freq: Four times a day (QID) | ORAL | Status: DC | PRN

## 2020-12-29 MED ORDER — ACETAMINOPHEN 650 MG RE SUPP: 650.0000 mg | Freq: Four times a day (QID) | RECTAL | Status: DC | PRN

## 2020-12-29 MED ORDER — INSULIN ASPART 100 UNIT/ML IJ SOLN
0.0000 [IU] | Freq: Every day | INTRAMUSCULAR | Status: DC
  Administered 2020-12-30: 3 [IU] via SUBCUTANEOUS
  Filled 2020-12-29: qty 1

## 2020-12-29 MED ORDER — DIPHENHYDRAMINE HCL 25 MG PO CAPS
25.0000 mg | ORAL_CAPSULE | Freq: Three times a day (TID) | ORAL | Status: DC | PRN
  Administered 2020-12-29 – 2021-01-02 (×6): 25 mg via ORAL
  Filled 2020-12-29 (×6): qty 1

## 2020-12-29 MED ORDER — VANCOMYCIN HCL IN DEXTROSE 1-5 GM/200ML-% IV SOLN
1000.0000 mg | Freq: Once | INTRAVENOUS | Status: AC
  Administered 2020-12-29: 1000 mg via INTRAVENOUS
  Filled 2020-12-29: qty 200

## 2020-12-29 MED ORDER — LEVOTHYROXINE SODIUM 25 MCG PO TABS
25.0000 ug | ORAL_TABLET | Freq: Every day | ORAL | Status: DC
  Administered 2020-12-29: 25 ug via ORAL
  Filled 2020-12-29 (×5): qty 1

## 2020-12-29 MED ORDER — INSULIN ASPART 100 UNIT/ML IJ SOLN
0.0000 [IU] | Freq: Three times a day (TID) | INTRAMUSCULAR | Status: DC
  Administered 2020-12-29 – 2020-12-30 (×3): 2 [IU] via SUBCUTANEOUS
  Administered 2020-12-31: 3 [IU] via SUBCUTANEOUS
  Administered 2020-12-31: 2 [IU] via SUBCUTANEOUS
  Administered 2021-01-01: 3 [IU] via SUBCUTANEOUS
  Administered 2021-01-01 – 2021-01-02 (×3): 2 [IU] via SUBCUTANEOUS
  Administered 2021-01-03: 3 [IU] via SUBCUTANEOUS
  Filled 2020-12-29 (×10): qty 1

## 2020-12-29 MED ORDER — INSULIN GLARGINE-YFGN 100 UNIT/ML ~~LOC~~ SOLN
20.0000 [IU] | Freq: Every day | SUBCUTANEOUS | Status: DC
  Administered 2020-12-29 – 2021-01-03 (×6): 20 [IU] via SUBCUTANEOUS
  Filled 2020-12-29 (×7): qty 0.2

## 2020-12-29 MED ORDER — DIPHENHYDRAMINE HCL 25 MG PO CAPS: 50.0000 mg | ORAL_CAPSULE | Freq: Once | ORAL | Status: DC

## 2020-12-29 MED ORDER — ACETAMINOPHEN 325 MG PO TABS
650.0000 mg | ORAL_TABLET | Freq: Four times a day (QID) | ORAL | Status: DC | PRN
  Administered 2020-12-30 – 2020-12-31 (×2): 650 mg via ORAL
  Filled 2020-12-29 (×2): qty 2

## 2020-12-29 MED ORDER — HYDROCODONE-ACETAMINOPHEN 5-325 MG PO TABS
1.0000 | ORAL_TABLET | ORAL | Status: DC | PRN
  Administered 2020-12-31 (×4): 2 via ORAL
  Administered 2021-01-01: 1 via ORAL
  Administered 2021-01-01 – 2021-01-03 (×5): 2 via ORAL
  Filled 2020-12-29 (×2): qty 1
  Filled 2020-12-29 (×2): qty 2
  Filled 2020-12-29: qty 1
  Filled 2020-12-29 (×2): qty 2
  Filled 2020-12-29: qty 1
  Filled 2020-12-29 (×5): qty 2

## 2020-12-29 MED ORDER — LOSARTAN POTASSIUM 25 MG PO TABS
25.0000 mg | ORAL_TABLET | Freq: Every day | ORAL | Status: DC
  Administered 2020-12-29: 25 mg via ORAL
  Filled 2020-12-29 (×2): qty 1

## 2020-12-29 MED ORDER — PIPERACILLIN-TAZOBACTAM 3.375 G IVPB 30 MIN
3.3750 g | Freq: Once | INTRAVENOUS | Status: AC
  Administered 2020-12-29: 3.375 g via INTRAVENOUS
  Filled 2020-12-29: qty 50

## 2020-12-29 MED ORDER — ENOXAPARIN SODIUM 60 MG/0.6ML IJ SOSY
0.5000 mg/kg | PREFILLED_SYRINGE | INTRAMUSCULAR | Status: DC
  Administered 2020-12-30 – 2021-01-02 (×4): 45 mg via SUBCUTANEOUS
  Filled 2020-12-29 (×5): qty 0.6

## 2020-12-29 NOTE — Progress Notes (Signed)
Pt admitted to room 133 from ED.     12/29/20 1822  Vitals  Temp 98.1 F (36.7 C)  Temp Source Oral  BP 106/75  MAP (mmHg) 85  BP Location Left Arm  BP Method Automatic  Patient Position (if appropriate) Sitting  Pulse Rate 88  Pulse Rate Source Monitor  Resp 16  MEWS COLOR  MEWS Score Color Green  Oxygen Therapy  SpO2 100 %  O2 Device Room Air

## 2020-12-29 NOTE — Consult Note (Signed)
Pharmacy Antibiotic Note  Brendan Pruitt is a 45 y.o. male with medical history including tobacco use, diabetes c/b neuropathy, history of DFIs, history of amputations, alcohol use, HTN, HLD admitted on 12/29/2020 with  right fifth metatarsal wound infection / concern for OM .  Pharmacy has been consulted for vancomycin and Zosyn dosing.  Plan:  Zosyn 3.375 g IV q8h (extended infusion)  Vancomycin 1 g IV q12h --Calculated AUC: 488, Cmin 12.3 --Daily Scr per protocol --Levels at steady state as clinically indicated  Height: 5\' 5"  (165.1 cm) Weight: 90.7 kg (200 lb) IBW/kg (Calculated) : 61.5  Temp (24hrs), Avg:98.4 F (36.9 C), Min:98.1 F (36.7 C), Max:98.7 F (37.1 C)  Recent Labs  Lab 12/28/20 1944  WBC 10.2  CREATININE 0.80  LATICACIDVEN 0.8    Estimated Creatinine Clearance: 123.3 mL/min (by C-G formula based on SCr of 0.8 mg/dL).    Allergies  Allergen Reactions   Other Itching    12/30/20 Paper towels    Antimicrobials this admission: Vancomycin 8/18 >>  Zosyn 8/18 >>   Dose adjustments this admission: N/A  Microbiology results:  Thank you for allowing pharmacy to be a part of this patient's care.  9/18 12/29/2020 4:18 AM

## 2020-12-29 NOTE — H&P (Signed)
History and Physical    Brendan BaliGabriel Schmelzle ZOX:[id number] DOB: 02-Jan-1976 DOA: 12/29/2020  PCP: Barbette ReichmannHande, Vishwanath, MD   Patient coming from: home  I have personally briefly reviewed patient's old medical [facility]  Chief Complaint: sent from foot doctor  HPI: Brendan Pruitt is a 45 y.o. male with medical history significant for hypertension, hyperlipidemia, diabetes mellitus, s/p left great toe amputation,  who was sent in by his podiatrist due to concerns for osteomyelitis.   Patient was hospitalized from 7/4-7/8 for sepsis secondary to right foot ulcer and fifth metatarsal osteomyelitis undergoing I&D with excision of fifth metatarsal head.  He was treated with IV antibiotics via PICC line.  At his follow-up appointment today x-rays were done which were concerning for recurrent osteomyelitis.  He was sent in for IV antibiotics and MRI.  Patient denies pain.  States his wife has been dressing his wound but states that the wound has been increasing in size.  ED course: On arrival, afebrile, BP 115/80, pulse 101 O2 sat 98% on room air Blood work for the most part unremarkable with normal WBC and normal lactic acid  MRI: Osteomyelitis of residual fifth metatarsal and fifth proximal phalanx shaft with 1.5 cm soft tissue abscess surrounding the fifth metatarsal stump with overlying skin ulceration  Patient started on Zosyn and vancomycin and hospitalist consulted for admission.  Review of Systems: As per HPI otherwise all other systems on review of systems negative.    Past Medical History:  Diagnosis Date   Acute osteomyelitis (HCC) 10/2019   phalanx left foot   Cellulitis and abscess of foot 10/2019   left.  using betadine wet to dry dressings.  treated with multiple antibiotics. 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   Hypertension    Neuromuscular  disorder (HCC)    diabetic neuropathy    Past Surgical History:  Procedure Laterality Date   AMPUTATION TOE Left 11/06/2019   Procedure: AMPUTATION TOE IPJ LEFT HALLUX;  Surgeon: Rosetta PosnerBaker, Andrew, DPM;  Location: [facility];  Service: Podiatry;  Laterality: Left;   CATARACT EXTRACTION W/PHACO Right 09/21/2020   Procedure: CATARACT EXTRACTION PHACO AND INTRAOCULAR LENS PLACEMENT (IOC) RIGHT DIABETIC;  Surgeon: Lockie MolaBrasington, Chadwick, MD;  [facility];  Service: Ophthalmology;  Laterality: Right;  Diabetic - oral meds cde 9.48 01:48.8 minutes 8.7%   CATARACT EXTRACTION W/PHACO Left 10/12/2020   Procedure: CATARACT EXTRACTION PHACO AND INTRAOCULAR LENS PLACEMENT (IOC) LEFT DIABETIC 8.16 01:37.0;  Surgeon: Lockie MolaBrasington, Chadwick, MD;  [facility];  Service: Ophthalmology;  Laterality: Left;  Diabetic - oral meds   I & D EXTREMITY Right 11/14/2020   Procedure: IRRIGATION AND DEBRIDEMENT EXTREMITY;  Surgeon: Linus Galasline, Todd, DPM;  Location: [facility];  Service: Podiatry;  Laterality: Right;   LUMBAR DISC SURGERY  2010   no metal   UMBILICAL HERNIA REPAIR  2010   mesh      reports that he quit smoking about 6 years ago. His smoking use included cigarettes. He has a 1.00 pack-year smoking history. He has never used smokeless tobacco. He reports current alcohol use of about 20.0 standard drinks per week. He reports that he does not use drugs.  Allergies  Allergen Reactions   Other Itching    Brown Paper towels    Family History  Problem Relation Age of Onset   Diabetes Mother  Diabetes Sister       Prior to Admission medications   Medication Sig Start Date End Date Taking? Authorizing Provider  acetaminophen (TYLENOL) 500 MG tablet Take 500 mg by mouth every 6 (six) hours as needed for mild pain, moderate pain or headache.   Yes [provider]  dapagliflozin propanediol (FARXIGA) 10 MG TABS tablet Take 10 mg by mouth daily.   Yes [provider]   gabapentin (NEURONTIN) 300 MG capsule Take 1 capsule by mouth 2 (two) times daily. 11/01/20 11/01/21 Yes [provider]  glipiZIDE (GLUCOTROL XL) 10 MG 24 hr tablet Take 10 mg by mouth 2 (two) times daily. 10/17/19  Yes [provider]  levothyroxine (SYNTHROID) 25 MCG tablet Take 25 mcg by mouth daily. 12/05/20  Yes [provider]  losartan (COZAAR) 25 MG tablet Take 25 mg by mouth daily.   Yes [provider]  metFORMIN (GLUCOPHAGE) 1000 MG tablet Take 1,000 mg by mouth 2 (two) times daily with a meal.  08/05/15  Yes [provider]  Multiple Vitamin (ONE-A-DAY MENS PO) Take 1 tablet by mouth daily.   Yes [provider]  mupirocin ointment (BACTROBAN) 2 % Apply 1 application topically in the morning and at bedtime. 12/17/20  Yes [provider]  Omega-3 Fatty Acids (OMEGA 3 PO) Take 1,080 mg by mouth in the morning and at bedtime.   Yes [provider]  OZEMPIC, 1 MG/DOSE, 4 MG/3ML SOPN Inject 1 mg into the skin every Sunday.  10/23/19  Yes [provider]  SANTYL ointment Apply topically. 11/25/20  Yes [provider]  traMADol (ULTRAM) 50 MG tablet Take 50 mg by mouth 3 (three) times daily as needed. 12/05/20  Yes [provider]  amoxicillin-clavulanate (AUGMENTIN) 875-125 MG tablet Take 1 tablet by mouth 2 (two) times daily. Patient not taking: Reported on 12/29/2020 12/06/20   Lynn Ito, MD  nirmatrelvir & ritonavir (PAXLOVID) 20 x 150 MG & 10 x 100MG  TBPK Take by mouth. Patient not taking: Reported on 12/29/2020 12/16/20   [provider]    Physical Exam: Vitals:   12/28/20 1936 12/28/20 1937 12/29/20 0130  BP: 115/80  138/85  Pulse: (!) 101  100  Resp: 18  15  Temp: 98.7 F (37.1 C)  98.1 F (36.7 C)  TempSrc: Oral  Oral  SpO2: 98%  99%  Weight:  90.7 kg   Height:  5\' 5"  (1.651 m)      Vitals:   12/28/20 1936 12/28/20 1937 12/29/20 0130  BP: 115/80  138/85   Pulse: (!) 101  100  Resp: 18  15  Temp: 98.7 F (37.1 C)  98.1 F (36.7 C)  TempSrc: Oral  Oral  SpO2: 98%  99%  Weight:  90.7 kg   Height:  5\' 5"  (1.651 m)       Constitutional: Alert and oriented x 3 . Not in any apparent distress HEENT:      Head: Normocephalic and atraumatic.         Eyes: PERLA, EOMI, Conjunctivae are normal. Sclera is non-icteric.       Mouth/Throat: Mucous membranes are moist.       Neck: Supple with no signs of meningismus. Cardiovascular: Regular rate and rhythm. No murmurs, gallops, or rubs. 2+ symmetrical distal pulses are present . No JVD. No LE edema Respiratory: Respiratory effort normal .Lungs sounds clear bilaterally. No wheezes, crackles, or rhonchi.  Gastrointestinal: Soft, non tender, and non distended with positive bowel  sounds.  Genitourinary: No CVA tenderness. Musculoskeletal: Amputation left great toe  neurologic:  Face is symmetric. Moving all extremities. No gross focal neurologic deficits . Skin: Ulcer plantar aspect right foot under fifth metatarsal head and on the lateral aspect right foot Psychiatric: Mood and affect are normal    Labs on Admission: I have personally reviewed following labs and imaging studies  CBC: Recent Labs  Lab 12/28/20 1944  WBC 10.2  NEUTROABS 6.3  HGB 13.4  HCT 38.4*  MCV 94.3  PLT 232   Basic Metabolic Panel: Recent Labs  Lab 12/28/20 1944  NA 134*  K 4.0  CL 100  CO2 25  GLUCOSE 151*  BUN 10  CREATININE 0.80  CALCIUM 9.1   GFR: Estimated Creatinine Clearance: 123.3 mL/min (by C-G formula based on SCr of 0.8 mg/dL). Liver Function Tests: Recent Labs  Lab 12/28/20 1944  AST 32  ALT 37  ALKPHOS 98  BILITOT 0.9  PROT 7.5  ALBUMIN 4.1   No results for input(s): LIPASE, AMYLASE in the last 168 hours. No results for input(s): AMMONIA in the last 168 hours. Coagulation Profile: No results for input(s): INR, PROTIME in the last 168 hours. Cardiac Enzymes: No results for  input(s): CKTOTAL, CKMB, CKMBINDEX, TROPONINI in the last 168 hours. BNP (last 3 results) No results for input(s): PROBNP in the last 8760 hours. HbA1C: No results for input(s): HGBA1C in the last 72 hours. CBG: No results for input(s): GLUCAP in the last 168 hours. Lipid Profile: No results for input(s): CHOL, HDL, LDLCALC, TRIG, CHOLHDL, LDLDIRECT in the last 72 hours. Thyroid Function Tests: No results for input(s): TSH, T4TOTAL, FREET4, T3FREE, THYROIDAB in the last 72 hours. Anemia Panel: No results for input(s): VITAMINB12, FOLATE, FERRITIN, TIBC, IRON, RETICCTPCT in the last 72 hours. Urine analysis:    Component Value Date/Time   COLORURINE YELLOW (A) 11/15/2019 0730   APPEARANCEUR CLEAR (A) 11/15/2019 0730   LABSPEC 1.020 11/15/2019 0730   PHURINE 7.0 11/15/2019 0730   GLUCOSEU NEGATIVE 11/15/2019 0730   HGBUR NEGATIVE 11/15/2019 0730   BILIRUBINUR NEGATIVE 11/15/2019 0730   KETONESUR NEGATIVE 11/15/2019 0730   PROTEINUR NEGATIVE 11/15/2019 0730   NITRITE NEGATIVE 11/15/2019 0730   LEUKOCYTESUR NEGATIVE 11/15/2019 0730    Radiological Exams on Admission: No results found.   Assessment/Plan 45 year old male with history of hypertension,  diabetes mellitus, s/p left great toe  amputation, who was sent in by his podiatrist due to concerns for osteomyelitis.     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)   Abscess right foot   History of amputation of left great toe (HCC) - Nonhealing wound right foot s/p recent amputation fifth metatarsophalangeal joint - MRI: Osteomyelitis of residual fifth metatarsal and fifth proximal phalanx shaft with 1.5 cm soft tissue abscess surrounding the fifth metatarsal stump with overlying skin ulceration - Continue vancomycin and Zosyn - Podiatry consult    Diabetes mellitus, type II (HCC) - Sliding scale insulin coverage    HTN (hypertension) -Continue home  meds    DVT prophylaxis: Lovenox  Code Status: full code  Family Communication:  none  Disposition Plan: Back to previous home environment Consults called: podiatry  Status:At the time of admission, it appears that the appropriate admission status for this patient is INPATIENT. This is judged to be reasonable and necessary in order to provide the required intensity of service to ensure the patient's safety given  the presenting symptoms, physical exam findings, and initial radiographic and laboratory data in the context of their  Comorbid conditions.   Patient requires inpatient status due to high intensity of service, high risk for further deterioration and high frequency of surveillance required.   I certify that at the point of admission it is my clinical judgment that the patient will require inpatient hospital care spanning beyond 2 midnights     Andris Baumann MD [facility]     12/29/2020, 4:18 AM

## 2020-12-29 NOTE — ED Notes (Signed)
Transport requested at this time.

## 2020-12-29 NOTE — Evaluation (Signed)
Occupational Therapy Evaluation Patient Details Name: Brendan Pruitt MRN: [id number] DOB: 03/11/1976 Today's Date: 12/29/2020    History of Present Illness Pt is a 45 y/o M with PMH: HTN, HLD, DM, L great toe amputation, and recent hospitalization 7/4-7/8 for sepsis 2/2 right foot ulcer and fifth metatarsal osteomyelitis undergoing I&D with excision of fifth metatarsal head. Pt presents this admission from podiatry office d/t concern for R foot osteomylitis. Confirmed on MRI with plan for further debridement of R foot and amputation of fifth toe on 8/18.   Clinical Impression   Pt seen for OT evaluation this date in setting of acute hospitalization d/t concern for recurrent osteomyelitis. Pt reports some decreased mobility since previous mobilization, states he primarily completes [place] distances with RW and his wedge shoes. Pt presents this date with some decreased balance r/t anterior/midfoot (plantar aspect) pain, impacting his balance, transfers and standing ADLs. Pt requires MIN A/CGA with UE support at least unilaterally to maintain weight in his heels only in standing in an effort to adhere to previously set WB restrictions (obtained from chart, Dr. Trena Platt discharge summary last hospitalization July 2022). Pt demos ability to perform seated ADLs with SETUP for LB. Pt returned to stretcher at end of session with all needs met and in reach.  Anticipate pt will require HHOT, but with the understanding that his functional status may change after tomorrows procedure. Will continue to follow.     Follow Up Recommendations  Home health OT    Equipment Recommendations  3 in 1 bedside commode;Tub/shower seat;Other (comment) (slideboard)    Recommendations for Other Services       Precautions / Restrictions Precautions Precautions: Fall Restrictions Other Position/Activity Restrictions: no formal WB orders in currently. WB restrictions from previous hospitalization state "Weightbearing on the  heels of both feet in his wedge surgical shoes per podiatry". OT proceeds as though NWB to R foot d/t current wound.      Mobility Bed Mobility Overal bed mobility: Needs Assistance Bed Mobility: Supine to Sit;Sit to Supine     Supine to sit: Supervision;HOB elevated Sit to supine: Supervision;Min guard   General bed mobility comments: increased time    Transfers Overall transfer level: Needs assistance Equipment used: 1 person hand held assist Transfers: Sit to/from Stand Sit to Stand: Min assist;Min guard         General transfer comment: STS with RW from EOB with efforts to protect R foot and keep weight in heels    Balance Overall balance assessment: Needs assistance   Sitting balance-Leahy Scale: Normal       Standing balance-Leahy Scale: Fair Standing balance comment: While pt does have G balance, when encouraged to put weight in his heels to off load anterior plantar aspect of foot, pt with difficulty keeping weight in heels and maintaing standing balance without at least unilateral support from therapist.                           ADL either performed or assessed with clinical judgement   ADL                                         General ADL Comments: SETUP for seated UB ADLs. SUPV for sup to sit, MIN A for STS with hand held assist on L side with attempt to reduce pressure through/to R foot (  exception of heel). SETUP/SUPV for sitting LB ADLs. Cues to reduce WB To R LE.     Vision Patient Visual Report: No change from baseline       Perception     Praxis      Pertinent Vitals/Pain Pain Assessment: Faces Faces Pain Scale: Hurts a little bit Pain Location: feet Pain Descriptors / Indicators: Numbness;Tingling Pain Intervention(s): Limited activity within patient's tolerance;Monitored during session     Hand Dominance     Extremity/Trunk Assessment Upper Extremity Assessment Upper Extremity Assessment: Overall WFL for  tasks assessed   Lower Extremity Assessment Lower Extremity Assessment: RLE deficits/detail;LLE deficits/detail RLE Deficits / Details: limited tolerance for LE/foot ROM, but good/functional hip and knee ROM for LB ADLs. LLE Deficits / Details: limited DF/PF, but overall fxl. L great to amp hx       Communication Communication Communication: No difficulties   Cognition Arousal/Alertness: Awake/alert Behavior During Therapy: WFL for tasks assessed/performed Overall Cognitive Status: Within Functional Limits for tasks assessed                                 General Comments: oriented and appropriate.   General Comments       Exercises Other Exercises Other Exercises: OT ed with pt re: role of OT in acute setting and otherwise and importance of OOB Activity to improve circulation, but importance of keeping weight off open wounds as well to reduce risk of further infection or injury.   Shoulder Instructions      Home Living Family/patient expects to be discharged to:: Private residence Living Arrangements: Spouse/significant other;Children Available Help at Discharge: Family;Available 24 hours/day Type of Home: House Home Access: Stairs to enter [employer] of Steps: 3   Home Layout: One level               Home Equipment: Crutches;Walker - 2 wheels          Prior Functioning/Environment Level of Independence: Independent;Needs assistance  Gait / Transfers Assistance Needed: INDEP witth fxl mobility initially, but since last amputation, endorses being less mobile except HH distances with wedge shoes and RW ADL's / Homemaking Assistance Needed: Pt reports he is able to perform all ADLs, but requires IADL assist from wife as well as wound mgt-checking feet and dressing.            OT Problem List: Decreased activity tolerance;Impaired balance (sitting and/or standing);Impaired sensation      OT Treatment/Interventions: Self-care/ADL  training;Therapeutic activities;Therapeutic exercise;Patient/family education;DME and/or AE instruction;Balance training    OT Goals(Current goals can be found in the care plan section) Acute Rehab OT Goals Patient Stated Goal: to heal up and be able to get back to working eventually OT Goal Formulation: With patient Time For Goal Achievement: 01/12/21 Potential to Achieve Goals: Good ADL Goals Pt Will Transfer to Toilet: with modified independence;bedside commode Pt Will Perform Toileting - Clothing Manipulation and hygiene: with modified independence;sitting/lateral leans  OT Frequency: Min 1X/week   Barriers to D/C:            Co-evaluation              AM-PAC OT "6 Clicks" Daily Activity     Outcome Measure Help from another person eating meals?: None Help from another person taking care of personal grooming?: None Help from another person toileting, which includes using toliet, bedpan, or urinal?: A Little Help from another person bathing (including washing,  rinsing, drying)?: A Little Help from another person to put on and taking off regular upper body clothing?: None Help from another person to put on and taking off regular lower body clothing?: A Little 6 Click Score: 21   End of Session Equipment Utilized During Treatment: Gait belt;Rolling walker Nurse Communication: Mobility status;Other (comment) (need for guidane from physician re: WB)  Activity Tolerance: Patient tolerated treatment well Patient left: in bed;with call bell/phone within reach  OT Visit Diagnosis: Unsteadiness on feet (R26.81);Muscle weakness (generalized) (M62.81)                Time: 1594-5859 OT Time Calculation (min): 33 min Charges:  OT General Charges $OT Visit: 1 Visit OT Evaluation $OT Eval Moderate Complexity: 1 Mod OT Treatments $Self Care/Home Management : 8-22 mins  Gerrianne Scale, MS, OTR/L ascom [phone] 12/29/20, 4:11 PM

## 2020-12-29 NOTE — Progress Notes (Addendum)
Subjective/Chief Complaint: Patient seen.  Patient was referred over to the emergency department for admission due to continued infection in his right foot status post previous debridement.  Patient without any significant complaints at this point.   Objective: Vital signs in last 24 hours: Temp:  [98.1 F (36.7 C)-98.7 F (37.1 C)] 98.1 F (36.7 C) (08/18 0130) Pulse Rate:  [45-101] 45 (08/18 0900) Resp:  [11-20] 20 (08/18 1220) BP: (105-166)/(75-149) 112/79 (08/18 1220) SpO2:  [96 %-99 %] 98 % (08/18 1220) Weight:  [90.7 kg] 90.7 kg (08/17 1937)    Intake/Output from previous day: 08/17 0701 - 08/18 0700 In: 300 [IV Piggyback:300] Out: -  Intake/Output this shift: No intake/output data recorded.  Some bleeding and drainage noted on the bandaging on the right foot.  Upon removal continued full-thickness wound on the lateral aspect of the right foot at fifth metatarsal debridement site as well as superficial plantar ulcerations on the right foot.  MRI was reviewed of the right foot which reveals continued infection throughout the right fifth metatarsal as well as the remaining fifth toe region.  X-rays taken in the office yesterday also revealed significant destruction of the distal aspect of the fifth metatarsal. Pedal pulses are noted to be palpable on the right foot. Lab Results:  Recent Labs    12/28/20 1944 12/29/20 0656  WBC 10.2 6.1  HGB 13.4 13.5  HCT 38.4* 38.6*  PLT 232 216   BMET Recent Labs    12/28/20 1944 12/29/20 0656  NA 134*  --   K 4.0  --   CL 100  --   CO2 25  --   GLUCOSE 151*  --   BUN 10  --   CREATININE 0.80 0.62  CALCIUM 9.1  --    PT/INR No results for input(s): LABPROT, INR in the last 72 hours. ABG No results for input(s): PHART, HCO3 in the last 72 hours.  Invalid input(s): PCO2, PO2  Studies/Results: MR FOOT RIGHT WO CONTRAST  Result Date: 12/29/2020 CLINICAL DATA:  Right fifth ray infection.  Recent from amputation.  EXAM: MRI OF THE RIGHT FOREFOOT WITHOUT CONTRAST TECHNIQUE: Multiplanar, multisequence MR imaging of the right forefoot was performed. No intravenous contrast was administered. COMPARISON:  MRI right foot dated November 14, 2020. FINDINGS: Bones/Joint/Cartilage Prior amputation of the distal fifth metatarsal and base of the fifth proximal phalanx. Abnormal marrow edema with corresponding decreased T1 marrow signal involving the residual fifth metatarsal and fifth proximal phalanx shaft. No fracture or dislocation. Mild degenerative changes of the first MTP joint. No joint effusion. Ligaments Collateral ligaments are intact.  Lisfranc ligament is intact. Muscles and Tendons No tenosynovitis. Increased T2 signal within the intrinsic muscles of the forefoot, nonspecific, but likely related to diabetic muscle changes. Soft tissue 1.5 x 1.1 x 1.0 cm surrounding the fifth metatarsal stump with overlying skin ulceration. No soft tissue mass. IMPRESSION: 1. Osteomyelitis of the residual fifth metatarsal and fifth proximal phalanx shaft. 2. 1.5 cm soft tissue abscess surrounding the fifth metatarsal stump with overlying skin ulceration. Electronically Signed   By: Obie Dredge M.D.   On: 12/29/2020 05:26   US ARTERIAL ABI (SCREENING LOWER EXTREMITY)  Result Date: 12/29/2020 CLINICAL DATA:  History of peripheral arterial disease, toe amputation EXAM: NONINVASIVE PHYSIOLOGIC VASCULAR STUDY OF BILATERAL LOWER EXTREMITIES TECHNIQUE: Evaluation of both lower extremities were performed at rest, including calculation of ankle-brachial indices with single level Doppler, pressure and pulse volume recording. COMPARISON:  None. FINDINGS: Right ABI:  1.54 Left ABI:  1.46 Right Lower Extremity:  Normal arterial waveforms at the ankle. Left Lower Extremity:  Normal arterial waveforms at the ankle. 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 Normal arterial waveforms noted in both lower extremities by Doppler. Electronically Signed   By: Olive Bass M.D.   On: 12/29/2020 11:42   DG Chest Port 1 View  Result Date: 12/29/2020 CLINICAL DATA:  Right foot osteomyelitis.  No chest complaints EXAM: PORTABLE CHEST 1 VIEW COMPARISON:  11/23/2014 FINDINGS: Normal heart size and mediastinal contours. No acute infiltrate or edema. No effusion or pneumothorax. No acute osseous findings. Diffuse thoracic endplate spurring. IMPRESSION: Negative low volume chest. Electronically Signed   By: Marnee Spring M.D.   On: 12/29/2020 07:37    Anti-infectives: Anti-infectives (From admission, onward)    Start     Dose/Rate Route Frequency Ordered Stop   12/29/20 0600  vancomycin (VANCOCIN) IVPB 1000 mg/200 mL premix        1,000 mg 200 mL/hr over 60 Minutes Intravenous Every 12 hours 12/29/20 0426     12/29/20 0600  piperacillin-tazobactam (ZOSYN) IVPB 3.375 g        3.375 g 12.5 mL/hr over 240 Minutes Intravenous Every 8 hours 12/29/20 0427     12/29/20 0130  piperacillin-tazobactam (ZOSYN) IVPB 3.375 g        3.375 g 100 mL/hr over 30 Minutes Intravenous  Once 12/29/20 0121 12/29/20 0319   12/29/20 0130  vancomycin (VANCOCIN) IVPB 1000 mg/200 mL premix        1,000 mg 200 mL/hr over 60 Minutes Intravenous  Once 12/29/20 0124 12/29/20 0428       Assessment/Plan: s/p Procedure(s): Partial 5th Ray AMPUTATION (Right) Assessment: Continued osteomyelitis right foot status post recent debridement.  Plan: Dressing reapplied to the right foot.  Discussed with the patient that he will need further debridement of the bone in his right foot.  Conferred with Dr. Excell Seltzer and due to MRI findings plan will also be to go ahead and amputate the fifth toe as well.  Discussed with the patient possible risks and complications of the procedure including inability to heal due to his diabetes, continued infection, or circulation.   No guarantees could be given.  Consent will be obtained by Dr. Excell Seltzer.  Patient will be n.p.o. after midnight tonight.  Plan is for surgery tomorrow morning.  LOS: 0 days    Ricci Barker 12/29/2020

## 2020-12-29 NOTE — Consult Note (Signed)
PHARMACY -  BRIEF ANTIBIOTIC NOTE   Pharmacy has received consult(s) for vancomycin from an ED provider. Patient is also ordered Zosyn. The patient's profile has been reviewed for ht/wt/allergies/indication/available labs.    One time order(s) placed for  --Vancomycin 1 g IV   Further antibiotics/pharmacy consults should be ordered by admitting physician if indicated.                       Thank you, Tressie Ellis 12/29/2020  1:25 AM

## 2020-12-29 NOTE — ED Notes (Signed)
Patient notified of inpatient bed assignment. Patient pending handoff and transport to inpatient unit.

## 2020-12-29 NOTE — Progress Notes (Signed)
PT Cancellation Note  Patient Details Name: Yerachmiel Spinney MRN: [id number] DOB: 09-Jan-1976   Cancelled Treatment:    Reason Eval/Treat Not Completed: Other (comment) (pending foot surgery tomorrow).  Patient is now pending debridement and possible toe amputation tomorrow. PT will hold today and follow up post-op as appropriate. Please update activity orders and or weight bearing status as needed.   Donna Bernard, PT, MPT  Ina Homes 12/29/2020, 3:15 PM

## 2020-12-29 NOTE — Progress Notes (Signed)
PROGRESS NOTE                                                                                                                                                                                                             Patient Demographics:    Brendan Pruitt, is a 45 y.o. male, DOB - 09/19/1975, POE:[id number]  Outpatient Primary MD for the patient is Barbette Reichmann, MD    LOS - 0  Admit date - 12/29/2020    Chief Complaint  Patient presents with   Wound Infection    R 5th metatarsal       Brief Narrative (HPI from H&P)  Brendan Pruitt is a 46 y.o. male with medical history significant for hypertension, hyperlipidemia, diabetes mellitus, s/p left great toe amputation,  who was sent in by his podiatrist due to concerns for osteomyelitis, work-up suggestive of right foot abscess and osteomyelitis and he was admitted to the hospital.   Subjective:    Brendan Pruitt today has, No headache, No chest pain, No abdominal pain - No Nausea, No new weakness tingling or numbness, Mild L foot pain.   Assessment  & Plan :     Right foot fifth metatarsal osteomyelitis with right foot abscess present on admission in a patient with history of diabetic foot infection - he has been appropriately placed on IV antibiotics empirically, podiatry has been consulted will be going to the OR for surgical correction on 12/30/2020, in the meantime we will check ABI.  2.  Nonspecific EKG changes.  Likely incidental, chest pain-free symptom-free, check echocardiogram to evaluate EF and wall motion.  3. Obesity BMI 33, follow with PCP.  4.  Hypertension.  On ARB.  Monitor.  5.  DM type II.  On sliding scale, will add Lantus.  Will monitor and adjust.  Holding home Meds Glucotrol and Glucophage along with [place].   No results found for: HGBA1C  CBG (last 3)  Recent Labs    12/29/20 0751  GLUCAP 94         Condition - Fair  Family  Communication  :  None Present  Code Status :  Full  Consults  :  Podiatry  PUD Prophylaxis :    Procedures  :     ABI -  MRI R foot -  1. Osteomyelitis of the residual fifth  metatarsal and fifth proximal phalanx shaft. 2. 1.5 cm soft tissue abscess surrounding the fifth metatarsal stump with overlying skin ulceration.      Disposition Plan  :    Status is: Inpatient  Remains inpatient appropriate because:IV treatments appropriate due to intensity of illness or inability to take PO  Dispo: The patient is from: Home              Anticipated d/c is to: Home              Patient currently is not medically stable to d/c.   Difficult to place patient No   DVT Prophylaxis  :  Lovenox    Lab Results  Component Value Date   PLT 216 12/29/2020    Diet :  Diet Order             Diet NPO time specified  Diet effective midnight           Diet heart healthy/carb modified Room service appropriate? Yes; Fluid consistency: Thin  Diet effective now                    Inpatient Medications  Scheduled Meds:  enoxaparin (LOVENOX) injection  0.5 mg/kg Subcutaneous Q24H   insulin aspart  0-15 Units Subcutaneous TID WC   insulin aspart  0-5 Units Subcutaneous QHS   levothyroxine  25 mcg Oral Daily   losartan  25 mg Oral Daily   Continuous Infusions:  piperacillin-tazobactam (ZOSYN)  IV 3.375 g (12/29/20 0731)   vancomycin Stopped (12/29/20 0727)   PRN Meds:.acetaminophen **OR** acetaminophen, diphenhydrAMINE, HYDROcodone-acetaminophen, ondansetron **OR** ondansetron (ZOFRAN) IV  Antibiotics  :    Anti-infectives (From admission, onward)    Start     Dose/Rate Route Frequency Ordered Stop   12/29/20 0600  vancomycin (VANCOCIN) IVPB 1000 mg/200 mL premix        1,000 mg 200 mL/hr over 60 Minutes Intravenous Every 12 hours 12/29/20 0426     12/29/20 0600  piperacillin-tazobactam (ZOSYN) IVPB 3.375 g        3.375 g 12.5 mL/hr over 240 Minutes Intravenous Every 8 hours  12/29/20 0427     12/29/20 0130  piperacillin-tazobactam (ZOSYN) IVPB 3.375 g        3.375 g 100 mL/hr over 30 Minutes Intravenous  Once 12/29/20 0121 12/29/20 0319   12/29/20 0130  vancomycin (VANCOCIN) IVPB 1000 mg/200 mL premix        1,000 mg 200 mL/hr over 60 Minutes Intravenous  Once 12/29/20 0124 12/29/20 0428        Time Spent in minutes  30   Brendan Pruitt M.D on 12/29/2020 at 9:52 AM  To page go to [email or web address]   [facility] -  Office  [phone]     See all Orders from today for further details    Objective:   Vitals:   12/29/20 0730 12/29/20 0800 12/29/20 0830 12/29/20 0900  BP: (!) 166/149 (!) 140/95 (!) 135/92 116/79  Pulse: 84 84 82 (!) 45  Resp:  12 11 14   Temp:      TempSrc:      SpO2: 98% 98% 97% 97%  Weight:      Height:        Wt Readings from Last 3 Encounters:  12/28/20 90.7 kg  12/13/20 93.9 kg  11/29/20 93.9 kg     Intake/Output Summary (Last 24 hours) at 12/29/2020 0952 Last data filed at 12/29/2020 0428 Gross per 24 hour  Intake  300 ml  Output --  Net 300 ml     Physical Exam  Awake Alert, No new F.N deficits, Normal affect [place].AT,PERRAL Supple Neck,No JVD, No cervical lymphadenopathy appriciated.  Symmetrical Chest wall movement, Good air movement bilaterally, CTAB RRR,No Gallops,Rubs or new Murmurs, No Parasternal Heave +ve B.Sounds, Abd Soft, No tenderness, No organomegaly appriciated, No rebound - guarding or rigidity. Left big toe amputation, right foot under bandage   Data Review:    CBC Recent Labs  Lab 12/28/20 1944 12/29/20 0656  WBC 10.2 6.1  HGB 13.4 13.5  HCT 38.4* 38.6*  PLT 232 216  MCV 94.3 94.4  MCH 32.9 33.0  MCHC 34.9 35.0  RDW 12.7 12.6  LYMPHSABS 2.9  --   MONOABS 0.7  --   EOSABS 0.2  --   BASOSABS 0.0  --     Recent Labs  Lab 12/28/20 1944 12/29/20 0656  NA 134*  --   K 4.0  --   CL 100  --   CO2 25  --   GLUCOSE 151*  --   BUN 10  --   CREATININE 0.80 0.62  CALCIUM  9.1  --   AST 32  --   ALT 37  --   ALKPHOS 98  --   BILITOT 0.9  --   ALBUMIN 4.1  --   LATICACIDVEN 0.8  --     ------------------------------------------------------------------------------------------------------------------ No results for input(s): CHOL, HDL, LDLCALC, TRIG, CHOLHDL, LDLDIRECT in the last 72 hours.  No results found for: HGBA1C ------------------------------------------------------------------------------------------------------------------ No results for input(s): TSH, T4TOTAL, T3FREE, THYROIDAB in the last 72 hours.  Invalid input(s): FREET3  Cardiac Enzymes No results for input(s): CKMB, TROPONINI, MYOGLOBIN in the last 168 hours.  Invalid input(s): CK ------------------------------------------------------------------------------------------------------------------ No results found for: BNP    Radiology Reports    MR FOOT RIGHT WO CONTRAST  Result Date: 12/29/2020 CLINICAL DATA:  Right fifth ray infection.  Recent from amputation. EXAM: MRI OF THE RIGHT FOREFOOT WITHOUT CONTRAST TECHNIQUE: Multiplanar, multisequence MR imaging of the right forefoot was performed. No intravenous contrast was administered. COMPARISON:  MRI right foot dated November 14, 2020. FINDINGS: Bones/Joint/Cartilage Prior amputation of the distal fifth metatarsal and base of the fifth proximal phalanx. Abnormal marrow edema with corresponding decreased T1 marrow signal involving the residual fifth metatarsal and fifth proximal phalanx shaft. No fracture or dislocation. Mild degenerative changes of the first MTP joint. No joint effusion. Ligaments Collateral ligaments are intact.  Lisfranc ligament is intact. Muscles and Tendons No tenosynovitis. Increased T2 signal within the intrinsic muscles of the forefoot, nonspecific, but likely related to diabetic muscle changes. Soft tissue 1.5 x 1.1 x 1.0 cm surrounding the fifth metatarsal stump with overlying skin ulceration. No soft tissue mass.  IMPRESSION: 1. Osteomyelitis of the residual fifth metatarsal and fifth proximal phalanx shaft. 2. 1.5 cm soft tissue abscess surrounding the fifth metatarsal stump with overlying skin ulceration. Electronically Signed   By: Obie Dredge M.D.   On: 12/29/2020 05:26   DG Chest Port 1 View  Result Date: 12/29/2020 CLINICAL DATA:  Right foot osteomyelitis.  No chest complaints EXAM: PORTABLE CHEST 1 VIEW COMPARISON:  11/23/2014 FINDINGS: Normal heart size and mediastinal contours. No acute infiltrate or edema. No effusion or pneumothorax. No acute osseous findings. Diffuse thoracic endplate spurring. IMPRESSION: Negative low volume chest. Electronically Signed   By: Marnee Spring M.D.   On: 12/29/2020 07:37

## 2020-12-29 NOTE — ED Provider Notes (Signed)
[facility] Emergency Department Provider Note  ____________________________________________   Event Date/Time   First MD Initiated Contact with Patient 12/29/20 0102     (approximate)  I have reviewed the triage vital signs and the nursing notes.   HISTORY  Chief Complaint Wound Infection (R 5th metatarsal)    HPI Brendan Pruitt is a 45 y.o. male with history of osteomyelitis of the of the fifth metatarsal who comes in with concerns for recurrent osteomyelitis.  Patient's been on antibiotics including IV Zosyn.  Had an x-ray done with podiatry that showed continued osteomyelitis and was told to come in.  Patient states he does not have any fevers or worsening pain he otherwise feels okay.  This is been going on for a few weeks, constant, nothing makes it better or worse.          Past Medical History:  Diagnosis Date   Acute osteomyelitis (HCC) 10/2019   phalanx left foot   Cellulitis and abscess of foot 10/2019   left.  using betadine wet to dry dressings.  treated with multiple antibiotics. 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   Hypertension    Neuromuscular disorder (HCC)    diabetic neuropathy    Patient Active Problem List   Diagnosis Date Noted   Pain    Diabetic foot infection (HCC) 11/14/2020   Diabetic foot ulcer (HCC) 11/14/2020   Sepsis (HCC) 11/14/2020   Alcohol use 11/14/2020   Cellulitis of right foot 11/14/2020   HTN (hypertension) 11/14/2020   Diabetic neuropathy (HCC) 11/14/2020   Cellulitis of right lower extremity    Type 2 diabetes mellitus with hyperglycemia, without long-term current use of insulin (HCC) 09/12/2020   Ulcers of both lower legs (HCC) 09/12/2020   History of amputation of left great toe (HCC) 05/31/2020   Blurring of vision 03/11/2020   History of 2019 novel coronavirus disease  (COVID-19) 03/11/2020   Diabetic polyneuropathy associated with type 2 diabetes mellitus (HCC) 10/17/2019   Hyperlipemia, mixed 08/02/2015   Tobacco use disorder 09/17/2014    Past Surgical History:  Procedure Laterality Date   AMPUTATION TOE Left 11/06/2019   Procedure: AMPUTATION TOE IPJ LEFT HALLUX;  Surgeon: Rosetta Posner, DPM;  Location: [facility];  Service: Podiatry;  Laterality: Left;   CATARACT EXTRACTION W/PHACO Right 09/21/2020   Procedure: CATARACT EXTRACTION PHACO AND INTRAOCULAR LENS PLACEMENT (IOC) RIGHT DIABETIC;  Surgeon: Lockie Mola, MD;  Location: [facility];  Service: Ophthalmology;  Laterality: Right;  Diabetic - oral meds cde 9.48 01:48.8 minutes 8.7%   CATARACT EXTRACTION W/PHACO Left 10/12/2020   Procedure: CATARACT EXTRACTION PHACO AND INTRAOCULAR LENS PLACEMENT (IOC) LEFT DIABETIC 8.16 01:37.0;  Surgeon: Lockie Mola, MD;  Location: [facility];  Service: Ophthalmology;  Laterality: Left;  Diabetic - oral meds   I & D EXTREMITY Right 11/14/2020   Procedure: IRRIGATION AND DEBRIDEMENT EXTREMITY;  Surgeon: Linus Galas, DPM;  Location: [facility];  Service: Podiatry;  Laterality: Right;   LUMBAR DISC SURGERY  2010   no metal   UMBILICAL HERNIA REPAIR  2010   mesh     Prior to Admission medications   Medication Sig Start Date End Date Taking? Authorizing Provider  acetaminophen (TYLENOL) 500 MG tablet Take 500 mg by mouth every 6 (six) hours as needed for mild pain, moderate pain or headache.  [provider]  amoxicillin-clavulanate (AUGMENTIN) 875-125 MG tablet Take 1 tablet by mouth 2 (two) times daily. 12/06/20   Lynn Itoavishankar, Jayashree, MD  dapagliflozin propanediol (FARXIGA) 10 MG TABS tablet Take 10 mg by mouth daily.    [provider]  gabapentin (NEURONTIN) 300 MG capsule Take 1 capsule by mouth 2 (two) times daily. 11/01/20 11/01/21  [provider]  glipiZIDE (GLUCOTROL XL) 10 MG 24 hr tablet Take 10 mg  by mouth 2 (two) times daily. 10/17/19   [provider]  levothyroxine (SYNTHROID) 25 MCG tablet Take 25 mcg by mouth daily. 12/05/20   [provider]  losartan (COZAAR) 25 MG tablet Take 25 mg by mouth daily.    [provider]  metFORMIN (GLUCOPHAGE) 1000 MG tablet Take 1,000 mg by mouth 2 (two) times daily with a meal.  08/05/15   [provider]  Multiple Vitamin (ONE-A-DAY MENS PO) Take by mouth daily.    [provider]  Omega-3 Fatty Acids (OMEGA 3 PO) Take 1,080 mg by mouth in the morning and at bedtime.    [provider]  OZEMPIC, 1 MG/DOSE, 4 MG/3ML SOPN Inject 1 mg into the skin every Sunday.  10/23/19   [provider]  SANTYL ointment Apply topically. 11/25/20   [provider]  traMADol (ULTRAM) 50 MG tablet Take 50 mg by mouth 3 (three) times daily as needed. 12/05/20   [provider]    Allergies Other  Family History  Problem Relation Age of Onset   Diabetes Mother    Diabetes Sister     Social History Social History   Tobacco Use   Smoking status: Former    Packs/day: 0.25    Years: 4.00    Pack years: 1.00    Types: Cigarettes    Quit date: 2016    Years since quitting: 6.6   Smokeless tobacco: Never  Vaping Use   Vaping Use: Never used  Substance Use Topics   Alcohol use: Yes    Alcohol/week: 20.0 standard drinks    Types: 20 Cans of beer per week    Comment: does not drink much lately   Drug use: No      Review of Systems Constitutional: No fever/chills Eyes: No visual changes. ENT: No sore throat. Cardiovascular: Denies chest pain. Respiratory: Denies shortness of breath. Gastrointestinal: No abdominal pain.  No nausea, no vomiting.  No diarrhea.  No constipation. Genitourinary: Negative for dysuria. Musculoskeletal: Negative for back pain.  Right foot wound Skin: Negative for rash. Neurological: Negative for headaches, focal weakness or numbness. All other ROS  negative ____________________________________________   PHYSICAL EXAM:  VITAL SIGNS: ED Triage Vitals  Enc Vitals Group     BP 12/28/20 1936 115/80     Pulse Rate 12/28/20 1936 (!) 101     Resp 12/28/20 1936 18     Temp 12/28/20 1936 98.7 F (37.1 C)     Temp Source 12/28/20 1936 Oral     SpO2 12/28/20 1936 98 %     Weight 12/28/20 1937 200 lb (90.7 kg)     Height 12/28/20 1937 5\' 5"  (1.651 m)     Head Circumference --      Peak Flow --      Pain Score 12/28/20 1937 0     Pain Loc --      Pain Edu? --      Excl. in GC? --     Constitutional: Alert and oriented. Well appearing and  in no acute distress. Eyes: Conjunctivae are normal. EOMI. Head: Atraumatic. Nose: No congestion/rhinnorhea. Mouth/Throat: Mucous membranes are moist.   Neck: No stridor. Trachea Midline. FROM Cardiovascular: Normal rate, regular rhythm. Grossly normal heart sounds.  Good peripheral circulation. Respiratory: Normal respiratory effort.  No retractions. Lungs CTAB. Gastrointestinal: Soft and nontender. No distention. No abdominal bruits.  Musculoskeletal: Wounds noted to the right foot.  1 on the lateral aspect of the fifth metatarsal and one on the plantar surface.  No significant redness or erythema noted.  Distal pulse intact Neurologic:  Normal speech and language. No gross focal neurologic deficits are appreciated.  Skin:  Skin is warm, dry and intact. No rash noted. Psychiatric: Mood and affect are normal. Speech and behavior are normal. GU: Deferred   ____________________________________________   LABS (all labs ordered are listed, but only abnormal results are displayed)  Labs Reviewed  COMPREHENSIVE METABOLIC PANEL - Abnormal; Notable for the following components:      Result Value   Sodium 134 (*)    Glucose, Bld 151 (*)    All other components within normal limits  CBC WITH DIFFERENTIAL/PLATELET - Abnormal; Notable for the following components:   RBC 4.07 (*)    HCT 38.4 (*)     All other components within normal limits  RESP PANEL BY RT-PCR (FLU A&B, COVID) ARPGX2  LACTIC ACID, PLASMA   ____________________________________________  RADIOLOGY Vela Prose, personally viewed and evaluated these images (plain radiographs) as part of my medical decision making, as well as reviewing the written report by the radiologist.  ED MD interpretation:  pending  Official radiology report(s): No results found.  ____________________________________________   PROCEDURES  Procedure(s) performed (including Critical Care):  .Critical Care  Date/Time: 12/29/2020 1:23 AM Performed by: Concha Se, MD Authorized by: Concha Se, MD   Critical care provider statement:    Critical care time (minutes):  35   Critical care was necessary to treat or prevent imminent or life-threatening deterioration of the following conditions: osteo requiring two antibiotics.   Critical care was time spent personally by me on the following activities:  Discussions with consultants, evaluation of patient's response to treatment, examination of patient, ordering and performing treatments and interventions, ordering and review of laboratory studies, ordering and review of radiographic studies, pulse oximetry, re-evaluation of patient's condition, obtaining history from patient or surrogate and review of old charts   ____________________________________________   INITIAL IMPRESSION / ASSESSMENT AND PLAN / ED COURSE  Brendan Pruitt was evaluated in Emergency Department on 12/29/2020 for the symptoms described in the history of present illness. He was evaluated in the context of the global COVID-19 pandemic, which necessitated consideration that the patient might be at risk for infection with the SARS-CoV-2 virus that causes COVID-19. Institutional protocols and algorithms that pertain to the evaluation of patients at risk for COVID-19 are in a state of rapid change based on information released  by regulatory bodies including the CDC and federal and state organizations. These policies and algorithms were followed during the patient's care in the ED.    Patient comes in with concerns for osteomyelitis.  I was able to talk to Dr. Excell Seltzer from podiatry who recommends MRI, IV antibiotics and admission for consideration of amputation versus going home on IV antibiotics.  Vascularly intact.  No evidence of fractures.  No cellulitis on examination at this time.  Patient's heart rate was slightly elevated but otherwise does not meet any sepsis criteria therefore sepsis alert was  not called patient will be started on antibiotics without blood cultures.  We will discussed the hospital team for admission       ____________________________________________   FINAL CLINICAL IMPRESSION(S) / ED DIAGNOSES   Final diagnoses:  Osteomyelitis of right foot, unspecified type (HCC)      MEDICATIONS GIVEN DURING THIS VISIT:  Medications  piperacillin-tazobactam (ZOSYN) IVPB 3.375 g (has no administration in time range)  vancomycin (VANCOCIN) IVPB 1000 mg/200 mL premix (has no administration in time range)     ED Discharge Orders     None        Note:  This document was prepared using Dragon voice recognition software and may include unintentional dictation errors.    Concha Se, MD 12/29/20 [phone]

## 2020-12-29 NOTE — ED Notes (Signed)
Patient transported to inpatient unit with RN.

## 2020-12-29 NOTE — ED Notes (Signed)
Rn to bedside to introduce self. IV alarming. Vanc is complete. Will start next antibiotic. Pt is CAOx4 and in no distress.

## 2020-12-29 NOTE — Progress Notes (Signed)
PHARMACIST - PHYSICIAN COMMUNICATION  CONCERNING:  Enoxaparin (Lovenox) for DVT Prophylaxis    RECOMMENDATION: Patient was prescribed enoxaparin 40mg  q24 hours for VTE prophylaxis.   Filed Weights   12/28/20 1937  Weight: 90.7 kg (200 lb)    Body mass index is 33.28 kg/m.  Estimated Creatinine Clearance: 123.3 mL/min (by C-G formula based on SCr of 0.8 mg/dL).   Based on [facility] policy patient is candidate for enoxaparin 0.5mg /kg TBW SQ every 24 hours based on BMI being >30.  DESCRIPTION: Pharmacy has adjusted enoxaparin dose per [facility] policy.  Patient is now receiving enoxaparin 45 mg every 24 hours    [facility] 12/29/2020 4:17 AM

## 2020-12-30 ENCOUNTER — Inpatient Hospital Stay (HOSPITAL_COMMUNITY)
Admit: 2020-12-30 | Discharge: 2020-12-30 | Disposition: A | Discharge status: Home / Self Care | Payer: Commercial Managed Care - PPO | Attending: Podiatry | Admitting: Podiatry

## 2020-12-30 ENCOUNTER — Inpatient Hospital Stay: Payer: Commercial Managed Care - PPO

## 2020-12-30 ENCOUNTER — Encounter: Payer: Self-pay | Admitting: Internal Medicine

## 2020-12-30 ENCOUNTER — Inpatient Hospital Stay: Payer: Commercial Managed Care - PPO | Admitting: Certified Registered Nurse Anesthetist

## 2020-12-30 ENCOUNTER — Encounter
Admission: EM | Disposition: A | Discharge status: Home / Self Care | Payer: Self-pay | Source: Home / Self Care | Attending: Internal Medicine

## 2020-12-30 DIAGNOSIS — M86171 Other acute osteomyelitis, right ankle and foot: Secondary | ICD-10-CM

## 2020-12-30 DIAGNOSIS — E11628 Type 2 diabetes mellitus with other skin complications: Secondary | ICD-10-CM

## 2020-12-30 DIAGNOSIS — L089 Local infection of the skin and subcutaneous tissue, unspecified: Secondary | ICD-10-CM

## 2020-12-30 DIAGNOSIS — I5031 Acute diastolic (congestive) heart failure: Secondary | ICD-10-CM

## 2020-12-30 HISTORY — PX: AMPUTATION: SHX166

## 2020-12-30 LAB — COMPREHENSIVE METABOLIC PANEL
ALT: 31 U/L (ref 0–44)
AST: 22 U/L (ref 15–41)
Albumin: 3.8 g/dL (ref 3.5–5.0)
Alkaline Phosphatase: 91 U/L (ref 38–126)
Anion gap: 7 (ref 5–15)
BUN: 11 mg/dL (ref 6–20)
CO2: 26 mmol/L (ref 22–32)
Calcium: 8.9 mg/dL (ref 8.9–10.3)
Chloride: 105 mmol/L (ref 98–111)
Creatinine, Ser: 0.81 mg/dL (ref 0.61–1.24)
GFR, Estimated: >60 mL/min (ref >60)
Glucose, Bld: 84 mg/dL (ref 70–99)
Potassium: 3.6 mmol/L (ref 3.5–5.1)
Sodium: 138 mmol/L (ref 135–145)
Total Bilirubin: 1 mg/dL (ref 0.3–1.2)
Total Protein: 7.2 g/dL (ref 6.5–8.1)

## 2020-12-30 LAB — CBC WITH DIFFERENTIAL/PLATELET
Abs Immature Granulocytes: 0.03 10*3/uL (ref 0.00–0.07)
Basophils Absolute: 0.1 10*3/uL (ref 0.0–0.1)
Basophils Relative: 1 %
Eosinophils Absolute: 0.3 10*3/uL (ref 0.0–0.5)
Eosinophils Relative: 4 %
HCT: 40.5 % (ref 39.0–52.0)
Hemoglobin: 14.1 g/dL (ref 13.0–17.0)
Immature Granulocytes: 1 %
Lymphocytes Relative: 15 %
Lymphs Abs: 1 10*3/uL (ref 0.7–4.0)
MCH: 32.3 pg (ref 26.0–34.0)
MCHC: 34.8 g/dL (ref 30.0–36.0)
MCV: 92.7 fL (ref 80.0–100.0)
Monocytes Absolute: 0.8 10*3/uL (ref 0.1–1.0)
Monocytes Relative: 11 %
Neutro Abs: 4.5 10*3/uL (ref 1.7–7.7)
Neutrophils Relative %: 68 %
Platelets: 216 10*3/uL (ref 150–400)
RBC: 4.37 MIL/uL (ref 4.22–5.81)
RDW: 12.8 % (ref 11.5–15.5)
WBC: 6.6 10*3/uL (ref 4.0–10.5)
nRBC: 0 % (ref 0.0–0.2)

## 2020-12-30 LAB — GLUCOSE, CAPILLARY
Glucose-Capillary: 122 mg/dL — ABNORMAL HIGH (ref 70–99)
Glucose-Capillary: 99 mg/dL (ref 70–99)
Glucose-Capillary: 132 mg/dL — ABNORMAL HIGH (ref 70–99)
Glucose-Capillary: 137 mg/dL — ABNORMAL HIGH (ref 70–99)
Glucose-Capillary: 172 mg/dL — ABNORMAL HIGH (ref 70–99)

## 2020-12-30 LAB — PROCALCITONIN: Procalcitonin: <0.1 ng/mL

## 2020-12-30 LAB — C-REACTIVE PROTEIN: CRP: 1.4 mg/dL — ABNORMAL HIGH (ref <1.0)

## 2020-12-30 LAB — MAGNESIUM: Magnesium: 2.2 mg/dL (ref 1.7–2.4)

## 2020-12-30 LAB — BRAIN NATRIURETIC PEPTIDE: B Natriuretic Peptide: 18.6 pg/mL (ref 0.0–100.0)

## 2020-12-30 SURGERY — AMPUTATION, FOOT, RAY
Anesthesia: General | Site: Foot | Laterality: Right

## 2020-12-30 MED ORDER — PROPOFOL 10 MG/ML IV BOLUS
INTRAVENOUS | Status: AC
  Filled 2020-12-30: qty 20

## 2020-12-30 MED ORDER — NEOMYCIN-POLYMYXIN B GU 40-200000 IR SOLN
Status: DC | PRN
  Administered 2020-12-30: 1 mL

## 2020-12-30 MED ORDER — PHENYLEPHRINE HCL (PRESSORS) 10 MG/ML IV SOLN
INTRAVENOUS | Status: DC | PRN
  Administered 2020-12-30: 50 ug via INTRAVENOUS
  Administered 2020-12-30 (×8): 100 ug via INTRAVENOUS

## 2020-12-30 MED ORDER — MIDAZOLAM HCL 2 MG/2ML IJ SOLN
INTRAMUSCULAR | Status: AC
  Filled 2020-12-30: qty 2

## 2020-12-30 MED ORDER — LACTATED RINGERS IV SOLN: INTRAVENOUS | Status: AC

## 2020-12-30 MED ORDER — SODIUM CHLORIDE 0.9 % IV SOLN
INTRAVENOUS | Status: DC | PRN
  Administered 2020-12-30: 1000 mL via INTRAVENOUS

## 2020-12-30 MED ORDER — VANCOMYCIN HCL 1000 MG IV SOLR
INTRAVENOUS | Status: AC
  Filled 2020-12-30: qty 20

## 2020-12-30 MED ORDER — 0.9 % SODIUM CHLORIDE (POUR BTL) OPTIME
TOPICAL | Status: DC | PRN
  Administered 2020-12-30: 500 mL

## 2020-12-30 MED ORDER — SODIUM CHLORIDE 0.9 % IV SOLN
2.0000 g | INTRAVENOUS | Status: DC
  Administered 2020-12-31: 2 g via INTRAVENOUS
  Filled 2020-12-30: qty 20

## 2020-12-30 MED ORDER — SODIUM CHLORIDE 0.9 % IV SOLN: INTRAVENOUS | Status: DC | PRN

## 2020-12-30 MED ORDER — METRONIDAZOLE 500 MG PO TABS
500.0000 mg | ORAL_TABLET | Freq: Three times a day (TID) | ORAL | Status: DC
  Administered 2020-12-31 – 2021-01-01 (×4): 500 mg via ORAL
  Filled 2020-12-30 (×4): qty 1

## 2020-12-30 MED ORDER — FENTANYL CITRATE (PF) 100 MCG/2ML IJ SOLN
INTRAMUSCULAR | Status: DC | PRN
  Administered 2020-12-30 (×2): 50 ug via INTRAVENOUS

## 2020-12-30 MED ORDER — NEOMYCIN-POLYMYXIN B GU 40-200000 IR SOLN
Status: AC
  Filled 2020-12-30: qty 20

## 2020-12-30 MED ORDER — MIDAZOLAM HCL 2 MG/2ML IJ SOLN
INTRAMUSCULAR | Status: DC | PRN
  Administered 2020-12-30: 2 mg via INTRAVENOUS

## 2020-12-30 MED ORDER — MEPERIDINE HCL 25 MG/ML IJ SOLN: 6.2500 mg | INTRAMUSCULAR | Status: DC | PRN

## 2020-12-30 MED ORDER — ONDANSETRON HCL 4 MG/2ML IJ SOLN
INTRAMUSCULAR | Status: DC | PRN
  Administered 2020-12-30: 4 mg via INTRAVENOUS

## 2020-12-30 MED ORDER — FENTANYL CITRATE (PF) 100 MCG/2ML IJ SOLN
INTRAMUSCULAR | Status: AC
  Filled 2020-12-30: qty 2

## 2020-12-30 MED ORDER — BUPIVACAINE HCL (PF) 0.5 % IJ SOLN
INTRAMUSCULAR | Status: AC
  Filled 2020-12-30: qty 30

## 2020-12-30 MED ORDER — PROPOFOL 10 MG/ML IV BOLUS
INTRAVENOUS | Status: DC | PRN
  Administered 2020-12-30: 160 mg via INTRAVENOUS

## 2020-12-30 MED ORDER — SODIUM CHLORIDE 0.9 % IR SOLN
Status: DC | PRN
  Administered 2020-12-30: 1000 mL

## 2020-12-30 MED ORDER — OXYCODONE HCL 5 MG/5ML PO SOLN: 5.0000 mg | Freq: Once | ORAL | Status: DC | PRN

## 2020-12-30 MED ORDER — PROMETHAZINE HCL 25 MG/ML IJ SOLN: 6.2500 mg | INTRAMUSCULAR | Status: DC | PRN

## 2020-12-30 MED ORDER — PHENYLEPHRINE HCL (PRESSORS) 10 MG/ML IV SOLN
INTRAVENOUS | Status: AC
  Filled 2020-12-30: qty 1

## 2020-12-30 MED ORDER — VANCOMYCIN HCL 1000 MG IV SOLR
INTRAVENOUS | Status: DC | PRN
  Administered 2020-12-30: 1000 mg

## 2020-12-30 MED ORDER — OXYCODONE HCL 5 MG PO TABS: 5.0000 mg | ORAL_TABLET | Freq: Once | ORAL | Status: DC | PRN

## 2020-12-30 MED ORDER — FENTANYL CITRATE (PF) 100 MCG/2ML IJ SOLN: 25.0000 ug | INTRAMUSCULAR | Status: DC | PRN

## 2020-12-30 MED ORDER — BUPIVACAINE HCL 0.5 % IJ SOLN
INTRAMUSCULAR | Status: DC | PRN
  Administered 2020-12-30: 20 mL

## 2020-12-30 MED ORDER — LIDOCAINE HCL (CARDIAC) PF 100 MG/5ML IV SOSY
PREFILLED_SYRINGE | INTRAVENOUS | Status: DC | PRN
  Administered 2020-12-30: 100 mg via INTRAVENOUS

## 2020-12-30 SURGICAL SUPPLY — 48 items
BLADE MED AGGRESSIVE (BLADE) ×2 IMPLANT
BLADE OSC/SAGITTAL MD 5.5X18 (BLADE) IMPLANT
BLADE SURG 15 STRL LF DISP TIS (BLADE) IMPLANT
BLADE SURG 15 STRL SS (BLADE) ×2
BLADE SURG MINI STRL (BLADE) IMPLANT
BNDG CONFORM 2 STRL LF (GAUZE/BANDAGES/DRESSINGS) IMPLANT
BNDG ELASTIC 4X5.8 VLCR STR LF (GAUZE/BANDAGES/DRESSINGS) ×2 IMPLANT
BNDG ESMARK 4X12 TAN STRL LF (GAUZE/BANDAGES/DRESSINGS) ×2 IMPLANT
BNDG GAUZE ELAST 4 BULKY (GAUZE/BANDAGES/DRESSINGS) ×2 IMPLANT
CANISTER SUCT 1200ML W/VALVE (MISCELLANEOUS) ×2 IMPLANT
CNTNR SPEC 2.5X3XGRAD LEK (MISCELLANEOUS) ×1
CONT SPEC 4OZ STER OR WHT (MISCELLANEOUS) ×1
CONTAINER SPEC 2.5X3XGRAD LEK (MISCELLANEOUS) ×1 IMPLANT
CUFF TOURN SGL QUICK 12 (TOURNIQUET CUFF) IMPLANT
CUFF TOURN SGL QUICK 18X4 (TOURNIQUET CUFF) ×1 IMPLANT
DRAPE FLUOR MINI C-ARM 54X84 (DRAPES) IMPLANT
DURAPREP 26ML APPLICATOR (WOUND CARE) ×2 IMPLANT
ELECT REM PT RETURN 9FT ADLT (ELECTROSURGICAL) ×2
ELECTRODE REM PT RTRN 9FT ADLT (ELECTROSURGICAL) ×1 IMPLANT
GAUZE 4X4 16PLY ~~LOC~~+RFID DBL (SPONGE) ×2 IMPLANT
GAUZE SPONGE 4X4 12PLY STRL (GAUZE/BANDAGES/DRESSINGS) ×4 IMPLANT
GAUZE XEROFORM 1X8 LF (GAUZE/BANDAGES/DRESSINGS) ×2 IMPLANT
GLOVE SURG ENC MOIS LTX SZ7 (GLOVE) ×2 IMPLANT
GLOVE SURG UNDER LTX SZ7 (GLOVE) ×2 IMPLANT
GOWN STRL REUS W/ TWL LRG LVL3 (GOWN DISPOSABLE) ×2 IMPLANT
GOWN STRL REUS W/TWL LRG LVL3 (GOWN DISPOSABLE) ×2
HANDPIECE VERSAJET DEBRIDEMENT (MISCELLANEOUS) IMPLANT
IV NS IRRIG 3000ML ARTHROMATIC (IV SOLUTION) IMPLANT
KIT STIMULAN RAPID CURE 5CC (Orthopedic Implant) ×1 IMPLANT
KIT TURNOVER KIT A (KITS) ×2 IMPLANT
LABEL OR SOLS (LABEL) ×2 IMPLANT
MANIFOLD NEPTUNE II (INSTRUMENTS) ×2 IMPLANT
NDL FILTER BLUNT 18X1 1/2 (NEEDLE) ×1 IMPLANT
NDL HYPO 25X1 1.5 SAFETY (NEEDLE) ×2 IMPLANT
NEEDLE FILTER BLUNT 18X 1/2SAF (NEEDLE) ×1
NEEDLE FILTER BLUNT 18X1 1/2 (NEEDLE) ×1 IMPLANT
NEEDLE HYPO 25X1 1.5 SAFETY (NEEDLE) ×4 IMPLANT
NS IRRIG 500ML POUR BTL (IV SOLUTION) ×2 IMPLANT
PACK EXTREMITY ARMC (MISCELLANEOUS) ×2 IMPLANT
SOL PREP PVP 2OZ (MISCELLANEOUS) ×2
SOLUTION PREP PVP 2OZ (MISCELLANEOUS) ×1 IMPLANT
STOCKINETTE STRL 6IN 960660 (GAUZE/BANDAGES/DRESSINGS) ×2 IMPLANT
SUT ETHILON 3-0 FS-10 30 BLK (SUTURE) ×4
SUT VIC AB 3-0 SH 27 (SUTURE) ×1
SUT VIC AB 3-0 SH 27X BRD (SUTURE) ×1 IMPLANT
SUTURE EHLN 3-0 FS-10 30 BLK (SUTURE) ×2 IMPLANT
SWAB CULTURE AMIES ANAERIB BLU (MISCELLANEOUS) ×1 IMPLANT
SYR 10ML LL (SYRINGE) ×2 IMPLANT

## 2020-12-30 NOTE — Anesthesia Preprocedure Evaluation (Signed)
Anesthesia Evaluation  Patient identified by MRN, date of birth, ID band Patient awake    Reviewed: Allergy & Precautions, NPO status , Patient's Chart, lab work & pertinent test results  History of Anesthesia Complications Negative for: history of anesthetic complications  Airway Mallampati: II  TM Distance: >3 FB Neck ROM: Full    Dental  (+) Poor Dentition   Pulmonary neg sleep apnea, neg COPD, former smoker,    breath sounds clear to auscultation- rhonchi (-) wheezing      Cardiovascular Exercise Tolerance: Good hypertension, Pt. on medications (-) CAD, (-) Past MI, (-) Cardiac Stents and (-) CABG  Rhythm:Regular Rate:Normal - Systolic murmurs and - Diastolic murmurs    Neuro/Psych neg Seizures negative neurological ROS  negative psych ROS   GI/Hepatic negative GI ROS, Neg liver ROS,   Endo/Other  diabetes, Oral Hypoglycemic Agents  Renal/GU negative Renal ROS     Musculoskeletal negative musculoskeletal ROS (+)   Abdominal (+) + obese,   Peds  Hematology negative hematology ROS (+)   Anesthesia Other Findings Past Medical History: 10/2019: Acute osteomyelitis (HCC)     Comment:  phalanx left foot 10/2019: Cellulitis and abscess of foot     Comment:  left.  using betadine wet to dry dressings.  treated               with multiple antibiotics. + pseudomonas No date: Diabetes mellitus without complication (HCC) 10/2019: Elevated hemoglobin A1c     Comment:  results are 12.5% 10/2019: Gangrene (HCC)     Comment:  left toe No date: Hypertension No date: Neuromuscular disorder (HCC)     Comment:  diabetic neuropathy   Reproductive/Obstetrics                             Anesthesia Physical Anesthesia Plan  ASA: 3  Anesthesia Plan: General   Post-op Pain Management:    Induction: Intravenous  PONV Risk Score and Plan: 1 and Ondansetron and Midazolam  Airway Management  Planned: LMA  Additional Equipment:   Intra-op Plan:   Post-operative Plan:   Informed Consent: I have reviewed the patients History and Physical, chart, labs and discussed the procedure including the risks, benefits and alternatives for the proposed anesthesia with the patient or authorized representative who has indicated his/her understanding and acceptance.     Dental advisory given  Plan Discussed with: CRNA and Anesthesiologist  Anesthesia Plan Comments:         Anesthesia Quick Evaluation

## 2020-12-30 NOTE — Anesthesia Postprocedure Evaluation (Signed)
Anesthesia Post Note  Patient: Brendan Pruitt  Procedure(s) Performed: Partial 5th Ray AMPUTATION (Right: Foot)  Patient location during evaluation: PACU Anesthesia Type: General Level of consciousness: awake and alert and oriented Pain management: pain level controlled Vital Signs Assessment: post-procedure vital signs reviewed and stable Respiratory status: spontaneous breathing, nonlabored ventilation and respiratory function stable Cardiovascular status: blood pressure returned to baseline and stable Postop Assessment: no signs of nausea or vomiting Anesthetic complications: no   No notable events documented.   Last Vitals:  Vitals:   12/30/20 0903 12/30/20 0939  BP: 94/70 91/64  Pulse: 88 90  Resp: 12 14  Temp: (!) 36.3 C   SpO2: 98% 97%    Last Pain:  Vitals:   12/30/20 0939  TempSrc: Oral  PainSc:                  Tecla Mailloux
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
## 2020-12-30 NOTE — H&P (Signed)
HISTORY AND PHYSICAL INTERVAL NOTE:  12/30/2020  7:19 AM  Brendan Pruitt  has presented today for surgery, with the diagnosis of osteomyelitis right foot with associated diabetic foot ulceration.  The various methods of treatment have been discussed with the patient.  No guarantees were given.  After consideration of risks, benefits and other options for treatment, the patient has consented to surgery.  I have reviewed the patients' chart and labs.    PROCEDURE: RIGHT PARTIAL 5TH RAY AMPUTATION APPLICATION OF ANTIBIOTIC BEADS POSSIBLE ROTATIONAL SKIN FLAP CLOSURE  A history and physical examination was performed in the hospital.  The patient was reexamined prior to procedure today along with labs and imaging.  There have been no changes to this history and physical examination.  Rosetta Posner, DPM

## 2020-12-30 NOTE — Transfer of Care (Signed)
Immediate Anesthesia Transfer of Care Note  Patient: Brendan Pruitt  Procedure(s) Performed: Partial 5th Ray AMPUTATION (Right: Foot)  Patient Location: PACU  Anesthesia Type:General  Level of Consciousness: awake and drowsy  Airway & Oxygen Therapy: Patient Spontanous Breathing and Patient connected to face mask oxygen  Post-op Assessment: Report given to RN and Post -op Vital signs reviewed and stable  Post vital signs: Reviewed and stable  Last Vitals:  Vitals Value Taken Time  BP 104/75 12/30/20 0838  Temp    Pulse 84 12/30/20 0841  Resp 14 12/30/20 0841  SpO2 100 % 12/30/20 0841  Vitals shown include unvalidated device data.  Last Pain:  Vitals:   12/30/20 0647  TempSrc: Oral  PainSc: 0-No pain         Complications: No notable events documented.

## 2020-12-30 NOTE — Progress Notes (Signed)
PROGRESS NOTE                                                                                                                                                                                                             Patient Demographics:    Brendan Pruitt, is a 45 y.o. male, DOB - [birth year], VWP:[id number]  Outpatient Primary MD for the patient is Barbette Reichmann, MD    LOS - 1  Admit date - 12/29/2020    Chief Complaint  Patient presents with   Wound Infection    R 5th metatarsal       Brief Narrative (HPI from H&P)  Brendan Pruitt is a 45 y.o. male with medical history significant for hypertension, hyperlipidemia, diabetes mellitus, s/p left great toe amputation,  who was sent in by his podiatrist due to concerns for osteomyelitis, work-up suggestive of right foot abscess and osteomyelitis and he was admitted to the hospital.   Subjective:    Brendan Pruitt today has, No headache, No chest pain, No abdominal pain - No Nausea, No new weakness tingling or numbness, Mild L foot pain.   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  For now continue antibiotics empirically and monitor.  Have contacted podiatrist Dr. Excell Seltzer on antibiotic course and duration.  2.  Nonspecific EKG changes.  Similar to EKG from July 2016, chest pain-free, troponin negative x2, echo pending.  3. Obesity BMI 33, follow with PCP.  4.  Hypertension.  On ARB.  Monitor.  5.  DM type II.  On sliding scale, will add Lantus.  Will  monitor and adjust.  Holding home Meds Glucotrol and Glucophage along with [place].    Lab Results  Component Value Date   HGBA1C 8.1 (H) 12/29/2020    CBG (last 3)  Recent Labs    12/29/20 2204 12/30/20 0510 12/30/20 0848  GLUCAP 121* 122* 99         Condition - Fair  Family Communication  :  None Present  Code Status :  Full  Consults  :  Podiatry  PUD Prophylaxis :    Procedures  :     Right foot surgery by Dr. Rosetta Posner on 12/30/2020.  Right partial fifth ray amputation Rotational skin flap closure Application of antibiotic beads  ABI - good waveforms but inconclusive.  MRI R foot -  1. Osteomyelitis of the residual fifth metatarsal and fifth proximal phalanx shaft. 2. 1.5 cm soft tissue abscess surrounding the fifth metatarsal stump with overlying skin ulceration.      Disposition Plan  :    Status is: Inpatient  Remains inpatient appropriate because:IV treatments appropriate due to intensity of illness or inability to take PO  Dispo: The patient is from: Home              Anticipated d/c is to: Home              Patient currently is not medically stable to d/c.   Difficult to place patient No   DVT Prophylaxis  :  Lovenox    Lab Results  Component Value Date   PLT 216 12/30/2020    Diet :  Diet Order             Diet heart healthy/carb modified Room service appropriate? Yes; Fluid consistency: Thin  Diet effective now                    Inpatient Medications  Scheduled Meds:  enoxaparin (LOVENOX) injection  0.5 mg/kg Subcutaneous Q24H   insulin aspart  0-15 Units Subcutaneous TID WC   insulin aspart  0-5 Units Subcutaneous QHS   insulin glargine-yfgn  20 Units Subcutaneous Daily   levothyroxine  25 mcg Oral Daily   losartan  25 mg Oral Daily   mupirocin ointment  1 application Nasal BID   Continuous Infusions:  piperacillin-tazobactam (ZOSYN)  IV 3.375 g (12/30/20 0454)   vancomycin 1,000 mg (12/30/20 0453)   PRN  Meds:.acetaminophen **OR** acetaminophen, diphenhydrAMINE, HYDROcodone-acetaminophen, ondansetron **OR** ondansetron (ZOFRAN) IV  Antibiotics  :    Anti-infectives (From admission, onward)    Start     Dose/Rate Route Frequency Ordered Stop   12/30/20 0842  vancomycin (VANCOCIN) powder  Status:  Discontinued          As needed 12/30/20 0843 12/30/20 0844   12/29/20 0600  vancomycin (VANCOCIN) IVPB 1000 mg/200 mL premix        1,000 mg 200 mL/hr over 60 Minutes Intravenous Every 12 hours 12/29/20 0426     12/29/20 0600  piperacillin-tazobactam (ZOSYN) IVPB 3.375 g        3.375 g 12.5 mL/hr over 240 Minutes Intravenous Every 8 hours 12/29/20 0427     12/29/20 0130  piperacillin-tazobactam (ZOSYN) IVPB 3.375 g        3.375 g 100 mL/hr over 30 Minutes Intravenous  Once 12/29/20 0121 12/29/20 0319   12/29/20 0130  vancomycin (VANCOCIN) IVPB 1000 mg/200 mL premix        1,000 mg 200 mL/hr over 60 Minutes Intravenous  Once 12/29/20 0124 12/29/20 0428        Time Spent in minutes  30   Susa Raring M.D on 12/30/2020 at 10:38 AM  To page go to [email or web address]   [facility] -  Office  [phone]     See all Orders from today for further details    Objective:   Vitals:   12/30/20 0845 12/30/20 0900 12/30/20 0903 12/30/20  0939  BP: 98/71 97/71 94/70  91/64  Pulse: 84 86 88 90  Resp: 14 13 12 14   Temp:   (!) 97.3 F (36.3 C)   TempSrc:    Oral  SpO2: 100% 100% 98% 97%  Weight:      Height:        Wt Readings from Last 3 Encounters:  12/28/20 90.7 kg  12/13/20 93.9 kg  11/29/20 93.9 kg     Intake/Output Summary (Last 24 hours) at 12/30/2020 1038 Last data filed at 12/30/2020 0828 Gross per 24 hour  Intake 300 ml  Output 5 ml  Net 295 ml     Physical Exam  Awake Alert, No new F.N deficits, Normal affect [place].AT,PERRAL Supple Neck,No JVD, No cervical lymphadenopathy appriciated.  Symmetrical Chest wall movement, Good air movement bilaterally, CTAB RRR,No  Gallops,Rubs or new Murmurs, No Parasternal Heave +ve B.Sounds, Abd Soft, No tenderness, No organomegaly appriciated, No rebound - guarding or rigidity. Left big toe amputation, right foot postop under bandage with right big toe amputated   Data Review:    CBC Recent Labs  Lab 12/28/20 1944 12/29/20 0656 12/30/20 0307  WBC 10.2 6.1 6.6  HGB 13.4 13.5 14.1  HCT 38.4* 38.6* 40.5  PLT 232 216 216  MCV 94.3 94.4 92.7  MCH 32.9 33.0 32.3  MCHC 34.9 35.0 34.8  RDW 12.7 12.6 12.8  LYMPHSABS 2.9  --  1.0  MONOABS 0.7  --  0.8  EOSABS 0.2  --  0.3  BASOSABS 0.0  --  0.1    Recent Labs  Lab 12/28/20 1944 12/29/20 0656 12/29/20 1234 12/30/20 0307  NA 134*  --   --  138  K 4.0  --   --  3.6  CL 100  --   --  105  CO2 25  --   --  26  GLUCOSE 151*  --   --  84  BUN 10  --   --  11  CREATININE 0.80 0.62  --  0.81  CALCIUM 9.1  --   --  8.9  AST 32  --   --  22  ALT 37  --   --  31  ALKPHOS 98  --   --  91  BILITOT 0.9  --   --  1.0  ALBUMIN 4.1  --   --  3.8  MG  --   --   --  2.2  CRP  --   --  1.0*  --   PROCALCITON  --   --  <0.10 <0.10  LATICACIDVEN 0.8  --   --   --   INR  --   --  1.0  --   HGBA1C  --  8.1*  --   --   BNP  --   --   --  18.6    ------------------------------------------------------------------------------------------------------------------ No results for input(s): CHOL, HDL, LDLCALC, TRIG, CHOLHDL, LDLDIRECT in the last 72 hours.  Lab Results  Component Value Date   HGBA1C 8.1 (H) 12/29/2020   ------------------------------------------------------------------------------------------------------------------ No results for input(s): TSH, T4TOTAL, T3FREE, THYROIDAB in the last 72 hours.  Invalid input(s): FREET3  Cardiac Enzymes No results for input(s): CKMB, TROPONINI, MYOGLOBIN in the last 168 hours.  Invalid input(s): CK ------------------------------------------------------------------------------------------------------------------     Component Value Date/Time   BNP 18.6 12/30/2020 0307      Radiology Reports    MR FOOT RIGHT WO CONTRAST  Result Date: 12/29/2020 CLINICAL DATA:  Right fifth ray  infection.  Recent from amputation. EXAM: MRI OF THE RIGHT FOREFOOT WITHOUT CONTRAST TECHNIQUE: Multiplanar, multisequence MR imaging of the right forefoot was performed. No intravenous contrast was administered. COMPARISON:  MRI right foot dated November 14, 2020. FINDINGS: Bones/Joint/Cartilage Prior amputation of the distal fifth metatarsal and base of the fifth proximal phalanx. Abnormal marrow edema with corresponding decreased T1 marrow signal involving the residual fifth metatarsal and fifth proximal phalanx shaft. No fracture or dislocation. Mild degenerative changes of the first MTP joint. No joint effusion. Ligaments Collateral ligaments are intact.  Lisfranc ligament is intact. Muscles and Tendons No tenosynovitis. Increased T2 signal within the intrinsic muscles of the forefoot, nonspecific, but likely related to diabetic muscle changes. Soft tissue 1.5 x 1.1 x 1.0 cm surrounding the fifth metatarsal stump with overlying skin ulceration. No soft tissue mass. IMPRESSION: 1. Osteomyelitis of the residual fifth metatarsal and fifth proximal phalanx shaft. 2. 1.5 cm soft tissue abscess surrounding the fifth metatarsal stump with overlying skin ulceration. Electronically Signed   By: Obie DredgeWilliam T Derry M.D.   On: 12/29/2020 05:26   DG Chest Port 1 View  Result Date: 12/29/2020 CLINICAL DATA:  Right foot osteomyelitis.  No chest complaints EXAM: PORTABLE CHEST 1 VIEW COMPARISON:  11/23/2014 FINDINGS: Normal heart size and mediastinal contours. No acute infiltrate or edema. No effusion or pneumothorax. No acute osseous findings. Diffuse thoracic endplate spurring. IMPRESSION: Negative low volume chest. Electronically Signed   By: Marnee SpringJonathon  Watts M.D.   On: 12/29/2020 07:37

## 2020-12-30 NOTE — Progress Notes (Signed)
Pt transferred from PACU to room 133. No pain at this time.    12/30/20 0939  Vitals  Temp Source Oral  BP 91/64  MAP (mmHg) 74  BP Location Left Arm  BP Method Automatic  Patient Position (if appropriate) Lying  Pulse Rate 90  Pulse Rate Source Monitor  Resp 14  Level of Consciousness  Level of Consciousness Alert  MEWS COLOR  MEWS Score Color Green  Oxygen Therapy  SpO2 97 %  O2 Device Room Air

## 2020-12-30 NOTE — Progress Notes (Signed)
*  PRELIMINARY RESULTS* Echocardiogram 2D Echocardiogram has been performed.  Brendan Pruitt 12/30/2020, 7:31 PM

## 2020-12-30 NOTE — Anesthesia Procedure Notes (Signed)
Procedure Name: LMA Insertion Date/Time: 12/30/2020 7:40 AM Performed by: Joanette Gula, Arelis Neumeier, CRNA Pre-anesthesia Checklist: Patient identified, Emergency Drugs available, Suction available and Patient being monitored Patient Re-evaluated:Patient Re-evaluated prior to induction Oxygen Delivery Method: Circle system utilized Preoxygenation: Pre-oxygenation with 100% oxygen Induction Type: IV induction Ventilation: Mask ventilation without difficulty LMA: LMA inserted LMA Size: 4.0 Number of attempts: 1 Placement Confirmation: positive ETCO2 and breath sounds checked- equal and bilateral Tube secured with: Tape Dental Injury: Teeth and Oropharynx as per pre-operative assessment

## 2020-12-30 NOTE — Consult Note (Signed)
NAME: Brendan Pruitt  DOB: May 29, 1975  MRN: [id number]  Date/Time: 12/30/2020 2:27 PM  REQUESTING PROVIDER: Dr. Excell Seltzer Subjective:  REASON FOR CONSULT: Diabetic foot infection ?Patient known to me from previous admissions. Brendan Pruitt is a 45 y.o. 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  The bone had osteo at the proximal margin.  So he  was treated with 3 weeks of IV Zosyn followed by 2 weeks of p.o. Augmentin.  During that admission the cultures of the bone was group B strep.  He was followed by podiatrist and was referred to the ED on 12/29/2020 because worsening infection at the site.  This morning he underwent right partial fifth ray amputation with rotational skin flap closure.  Patient also had a right plantar fourth metatarsal phalangeal joint ulceration secondary to bone. I am asked to see the patient for antibiotic management. Is doing well No fever No chills He said he recently had COVID No cough or shortness of breath.  Past Medical History:  Diagnosis Date   Acute osteomyelitis (HCC) 10/2019   phalanx left foot   Cellulitis and abscess of foot 10/2019   left.  using betadine wet to dry dressings.  treated with multiple antibiotics. 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   Hypertension    Neuromuscular disorder (HCC)    diabetic neuropathy    Past Surgical History:  Procedure Laterality Date   AMPUTATION TOE Left 11/06/2019   Procedure: AMPUTATION TOE IPJ LEFT HALLUX;  Surgeon: Rosetta Posner, DPM;  Location: [facility];  Service: Podiatry;  Laterality: Left;   CATARACT EXTRACTION W/PHACO Right 09/21/2020   Procedure: CATARACT EXTRACTION PHACO AND INTRAOCULAR LENS PLACEMENT (IOC) RIGHT DIABETIC;  Surgeon:  Lockie Mola, MD;  Location: [facility];  Service: Ophthalmology;  Laterality: Right;  Diabetic - oral meds cde 9.48 01:48.8 minutes 8.7%   CATARACT EXTRACTION W/PHACO Left 10/12/2020   Procedure: CATARACT EXTRACTION PHACO AND INTRAOCULAR LENS PLACEMENT (IOC) LEFT DIABETIC 8.16 01:37.0;  Surgeon: Lockie Mola, MD;  Location: [facility];  Service: Ophthalmology;  Laterality: Left;  Diabetic - oral meds   I & D EXTREMITY Right 11/14/2020   Procedure: IRRIGATION AND DEBRIDEMENT EXTREMITY;  Surgeon: Linus Galas, DPM;  Location: [facility];  Service: Podiatry;  Laterality: Right;   LUMBAR DISC SURGERY  2010   no metal   UMBILICAL HERNIA REPAIR  2010   mesh     Social History   Socioeconomic History   Marital status: Married    Spouse name: Publishing rights manager   Number of children: Not on file   Years of education: Not on file   Highest education level: Not on file  Occupational History   Not on file  Tobacco Use   Smoking status: Former    Packs/day: 0.25    Years: 4.00    Pack years: 1.00    Types: Cigarettes    Quit date: 2016    Years since quitting: 6.6   Smokeless tobacco: Never  Vaping Use   Vaping Use: Never used  Substance and Sexual Activity   Alcohol use: Yes    Alcohol/week: 20.0 standard drinks    Types: 20 Cans of beer per week    Comment: does not drink much lately  Drug use: No   Sexual activity: Yes  Other Topics Concern   Not on file  Social History Narrative   Patient lives with his wife and 5 children.   Social Determinants of Health   Financial Resource Strain: Not on file  Food Insecurity: Not on file  Transportation Needs: Not on file  Physical Activity: Not on file  Stress: Not on file  Social Connections: Not on file  Intimate Partner Violence: Not on file    Family History  Problem Relation Age of Onset   Diabetes Mother    Diabetes Sister    Allergies  Allergen Reactions   Other Itching    Manson Passey Paper towels    I? Current Facility-Administered Medications  Medication Dose Route Frequency Provider Last Rate Last Admin   acetaminophen (TYLENOL) tablet 650 mg  650 mg Oral Q6H PRN Rosetta Posner, DPM       Or   acetaminophen (TYLENOL) suppository 650 mg  650 mg Rectal Q6H PRN Rosetta Posner, DPM       diphenhydrAMINE (BENADRYL) capsule 25 mg  25 mg Oral Q8H PRN Rosetta Posner, DPM   25 mg at 12/30/20 0456   enoxaparin (LOVENOX) injection 45 mg  0.5 mg/kg Subcutaneous Q24H Rosetta Posner, DPM       HYDROcodone-acetaminophen (NORCO/VICODIN) 5-325 MG per tablet 1-2 tablet  1-2 tablet Oral Q4H PRN Rosetta Posner, DPM       insulin aspart (novoLOG) injection 0-15 Units  0-15 Units Subcutaneous TID WC Rosetta Posner, DPM   2 Units at 12/30/20 1208   insulin aspart (novoLOG) injection 0-5 Units  0-5 Units Subcutaneous QHS Rosetta Posner, DPM       insulin glargine-yfgn [facility]) injection 20 Units  20 Units Subcutaneous Daily Rosetta Posner, DPM   20 Units at 12/30/20 1209   lactated ringers infusion   Intravenous Continuous Leroy Sea, MD 125 mL/hr at 12/30/20 1201 New Bag at 12/30/20 1201   levothyroxine (SYNTHROID) tablet 25 mcg  25 mcg Oral Daily Rosetta Posner, DPM   25 mcg at 12/29/20 4081   losartan (COZAAR) tablet 25 mg  25 mg Oral Daily Rosetta Posner, DPM   25 mg at 12/29/20 1004   mupirocin ointment (BACTROBAN) 2 % 1 application  1 application Nasal BID Rosetta Posner, DPM   1 application at 12/30/20 1202   ondansetron (ZOFRAN) tablet 4 mg  4 mg Oral Q6H PRN Rosetta Posner, DPM       Or   ondansetron (ZOFRAN) injection 4 mg  4 mg Intravenous Q6H PRN Rosetta Posner, DPM       piperacillin-tazobactam (ZOSYN) IVPB 3.375 g  3.375 g Intravenous Q8H Rosetta Posner, DPM 12.5 mL/hr at 12/30/20 1426 3.375 g at 12/30/20 1426   vancomycin (VANCOCIN) IVPB 1000 mg/200 mL premix  1,000 mg Intravenous Q12H Rosetta Posner, DPM 200 mL/hr at 12/30/20 0453 1,000 mg at 12/30/20 0453     Abtx:  Anti-infectives (From  admission, onward)    Start     Dose/Rate Route Frequency Ordered Stop   12/30/20 0842  vancomycin (VANCOCIN) powder  Status:  Discontinued          As needed 12/30/20 0843 12/30/20 0844   12/29/20 0600  vancomycin (VANCOCIN) IVPB 1000 mg/200 mL premix        1,000 mg 200 mL/hr over 60 Minutes Intravenous Every 12 hours 12/29/20 0426     12/29/20 0600  piperacillin-tazobactam (ZOSYN) IVPB 3.375 g        3.375 g  12.5 mL/hr over 240 Minutes Intravenous Every 8 hours 12/29/20 0427     12/29/20 0130  piperacillin-tazobactam (ZOSYN) IVPB 3.375 g        3.375 g 100 mL/hr over 30 Minutes Intravenous  Once 12/29/20 0121 12/29/20 0319   12/29/20 0130  vancomycin (VANCOCIN) IVPB 1000 mg/200 mL premix        1,000 mg 200 mL/hr over 60 Minutes Intravenous  Once 12/29/20 0124 12/29/20 0428       REVIEW OF SYSTEMS:  Const: negative fever, negative chills, negative weight loss Eyes: negative diplopia or visual changes, negative eye pain ENT: negative coryza, negative sore throat Resp: negative cough, hemoptysis, dyspnea Cards: negative for chest pain, palpitations, lower extremity edema GU: negative for frequency, dysuria and hematuria GI: Negative for abdominal pain, diarrhea, bleeding, constipation Skin: negative for rash and pruritus Heme: negative for easy bruising and gum/nose bleeding MS: negative for myalgias, arthralgias, back pain and muscle weakness Neurolo:negative for headaches, dizziness, vertigo, memory problems  Psych: negative for feelings of anxiety, depression  Endocrine: Has, diabetes Allergy/Immunology-as above Objective:  VITALS:  BP (!) 87/64 (BP Location: Right Arm)   Pulse 95   Temp 98.2 F (36.8 C)   Resp 15   Ht 5\' 5"  (1.651 m)   Wt 90.7 kg   SpO2 96%   BMI 33.28 kg/m  PHYSICAL EXAM:  General: Alert, cooperative, no distress, appears stated age.  Head: Normocephalic, without obvious abnormality, atraumatic. Eyes: Conjunctivae clear, anicteric sclerae.  Pupils are equal ENT Nares normal. No drainage or sinus tenderness. Lips, mucosa, and tongue normal. No Thrush Neck: Supple, symmetrical, no adenopathy, thyroid: non tender no carotid bruit and no JVD. Back: No CVA tenderness. Lungs: Clear to auscultation bilaterally. No Wheezing or Rhonchi. No rales. Heart: Regular rate and rhythm, no murmur, rub or gallop. Abdomen: Soft, non-tender,not distended. Bowel sounds normal. No masses Extremities: Right foot surgical dressing not removed  Skin: No rashes or lesions. Or bruising Lymph: Cervical, supraclavicular normal. Neurologic: Grossly non-focal Pertinent Labs Lab Results CBC    Component Value Date/Time   WBC 6.6 12/30/2020 0307   RBC 4.37 12/30/2020 0307   HGB 14.1 12/30/2020 0307   HCT 40.5 12/30/2020 0307   PLT 216 12/30/2020 0307   MCV 92.7 12/30/2020 0307   MCH 32.3 12/30/2020 0307   MCHC 34.8 12/30/2020 0307   RDW 12.8 12/30/2020 0307   LYMPHSABS 1.0 12/30/2020 0307   MONOABS 0.8 12/30/2020 0307   EOSABS 0.3 12/30/2020 0307   BASOSABS 0.1 12/30/2020 0307    CMP Latest Ref Rng & Units 12/30/2020 12/29/2020 12/28/2020  Glucose 70 - 99 mg/dL 84 - 914(N151(H)  BUN 6 - 20 mg/dL 11 - 10  Creatinine 8.290.61 - 1.24 mg/dL 5.620.81 1.300.62 8.650.80  Sodium 135 - 145 mmol/L 138 - 134(L)  Potassium 3.5 - 5.1 mmol/L 3.6 - 4.0  Chloride 98 - 111 mmol/L 105 - 100  CO2 22 - 32 mmol/L 26 - 25  Calcium 8.9 - 10.3 mg/dL 8.9 - 9.1  Total Protein 6.5 - 8.1 g/dL 7.2 - 7.5  Total Bilirubin 0.3 - 1.2 mg/dL 1.0 - 0.9  Alkaline Phos 38 - 126 U/L 91 - 98  AST 15 - 41 U/L 22 - 32  ALT 0 - 44 U/L 31 - 37      Microbiology: Recent Results (from the past 240 hour(s))  Resp Panel by RT-PCR (Flu A&B, Covid) Nasopharyngeal Swab     Status: None   Collection Time: 12/29/20  1:45 AM   Specimen: Nasopharyngeal Swab; Nasopharyngeal(NP) swabs in vial transport medium  Result Value Ref Range Status   SARS Coronavirus 2 by RT PCR NEGATIVE NEGATIVE Final    Comment:  (NOTE) SARS-CoV-2 target nucleic acids are NOT DETECTED.  The SARS-CoV-2 RNA is generally detectable in upper respiratory specimens during the acute phase of infection. The lowest concentration of SARS-CoV-2 viral copies this assay can detect is 138 copies/mL. A negative result does not preclude SARS-Cov-2 infection and should not be used as the sole basis for treatment or other patient management decisions. A negative result may occur with  improper specimen collection/handling, submission of specimen other than nasopharyngeal swab, presence of viral mutation(s) within the areas targeted by this assay, and inadequate number of viral copies(<138 copies/mL). A negative result must be combined with clinical observations, patient history, and epidemiological information. The expected result is Negative.  Fact Sheet for Patients:  BloggerCourse.com  Fact Sheet for Healthcare Providers:  SeriousBroker.it  This test is no t yet approved or cleared by the [place] FDA and  has been authorized for detection and/or diagnosis of SARS-CoV-2 by FDA under an Emergency Use Authorization (EUA). This EUA will remain  in effect (meaning this test can be used) for the duration of the COVID-19 declaration under Section 564(b)(1) of the Act, 21 U.S.C.section 360bbb-3(b)(1), unless the authorization is terminated  or revoked sooner.       Influenza A by PCR NEGATIVE NEGATIVE Final   Influenza B by PCR NEGATIVE NEGATIVE Final    Comment: (NOTE) The Xpert Xpress SARS-CoV-2/FLU/RSV plus assay is intended as an aid in the diagnosis of influenza from Nasopharyngeal swab specimens and should not be used as a sole basis for treatment. Nasal washings and aspirates are unacceptable for Xpert Xpress SARS-CoV-2/FLU/RSV testing.  Fact Sheet for Patients: BloggerCourse.com  Fact Sheet for Healthcare  Providers: SeriousBroker.it  This test is not yet approved or cleared by the [place] FDA and has been authorized for detection and/or diagnosis of SARS-CoV-2 by FDA under an Emergency Use Authorization (EUA). This EUA will remain in effect (meaning this test can be used) for the duration of the COVID-19 declaration under Section 564(b)(1) of the Act, 21 U.S.C. section 360bbb-3(b)(1), unless the authorization is terminated or revoked.  Performed at [facility], [address] [place], [place] [address]   Surgical PCR screen     Status: None   Collection Time: 12/29/20 10:13 PM   Specimen: Nasal Mucosa; Nasal Swab  Result Value Ref Range Status   MRSA, PCR NEGATIVE NEGATIVE Final   Staphylococcus aureus NEGATIVE NEGATIVE Final    Comment: (NOTE) The Xpert SA Assay (FDA approved for NASAL specimens in patients 23 years of age and older), is one component of a comprehensive surveillance program. It is not intended to diagnose infection nor to guide or monitor treatment. Performed at [facility], [address] [place], [place] [address]   Aerobic/Anaerobic Culture w Gram Stain (surgical/deep wound)     Status: None (Preliminary result)   Collection Time: 12/30/20  8:02 AM   Specimen: Other Source; Tissue  Result Value Ref Range Status   Specimen Description   Final    WOUND Performed at [facility], [address], [place], [place] [address]    Special Requests   Final    RIGHT FOOT WOUND Performed at [facility], [address] [place], [place] [address]    Gram Stain   Final  NO WBC SEEN NO ORGANISMS SEEN Performed at [facility], [address] [place], [place] [address]    Culture PENDING  Incomplete   Report Status PENDING  Incomplete  Aerobic/Anaerobic Culture w Gram Stain (surgical/deep wound)     Status: None (Preliminary result)   Collection Time: 12/30/20  8:09 AM   Specimen:  Other Source; Tissue  Result Value Ref Range Status   Specimen Description   Final    TISSUE Performed at [facility], [address] [place], [place] [address]    Special Requests   Final    RIGHT 5TH METATERSAL BONE CULTURE Performed at [facility], [address] [place], [place] [address]    Gram Stain   Final    NO WBC SEEN NO ORGANISMS SEEN Performed at [facility], [address], [place], [place] [address]    Culture PENDING  Incomplete   Report Status PENDING  Incomplete    IMAGING RESULTS: I have personally reviewed the films ? Impression/Recommendation Diabetic foot infection status post osteomyelitis of the fifth toe leading to metatarsal head amputation on 11/14/2020.  Residual osteomyelitis was present and he was treated with 3 weeks of IV Zosyn and followed by p.o. Augmentin.  Culture from the bone was group B streptococcus Patient admitted with residual osteomyelitis and infection and underwent fifth ray excision today. Currently on Vanco and Zosyn Change the latter to ceftriaxone and Flagyl in order to avoid renal issues Patient is going to go home with IV antibiotic.  If there is no MRSA Vanco would be stopped.  Diabetes mellitus on insulin ? Hypothyroidism on Synthroid  ? _Discussed the management with the patient and the care team. ID will follow him peripherally this weekend.  Call if needed.  [phone] __________________________________________________ Discussed with patient, requesting provider Note:  This document was prepared using Dragon voice recognition software and may include unintentional dictation errors.

## 2020-12-30 NOTE — Evaluation (Signed)
Physical Therapy Evaluation Patient Details Name: Brendan Pruitt MRN: [id number] DOB: 1975/10/26 Today's Date: 12/30/2020   History of Present Illness  Pt is a 45 y/o M with PMH: HTN, HLD, DM, L great toe amputation, and recent hospitalization 7/4-7/8 for sepsis 2/2 right foot ulcer and fifth metatarsal osteomyelitis undergoing I&D with excision of fifth metatarsal head. Pt presents this admission from podiatry office d/t concern for R foot osteomylitis. Confirmed on MRI. Pt underwent R partial 5th ray amputation, application of antibiotic beads, & rotational skin flap closure on 12/30/20 by Dr. Excell Seltzer.  Clinical Impression  Pt seen for PT evaluation with pt agreeable to tx. Pt is able to complete bed mobility with mod I, CGA for transfers, & CGA progressing to supervision with gait with RW (for short distances in room). PT educates pt on [facility] & need for built up post op shoe. Pt noted to have low BP sitting in recliner so further mobility declined at this time. Pt would benefit from stair training prior to d/c home (pt has 3 steps to enter house with wideset B rails & podiatry cleared pt to negotiate stairs for this purpose).   BP 87/64 mmHg, rechecked 89/66 mmHg    Follow Up Recommendations Home health PT;Supervision for mobility/OOB    Equipment Recommendations  Wheelchair (measurements PT);Wheelchair cushion (measurements PT)    Recommendations for Other Services       Precautions / Restrictions Precautions Precautions: Fall Restrictions Weight Bearing Restrictions: Yes RLE Weight Bearing: Partial weight bearing RLE Partial Weight Bearing Percentage or Pounds: PWB through RLE heel (per podiatry - pt can ambulate short distances & is cleared for stair negotiation for home access)      Mobility  Bed Mobility Overal bed mobility: Modified Independent Bed Mobility: Supine to Sit;Sit to Supine     Supine to sit: Modified independent (Device/Increase time);HOB elevated Sit to supine:  Modified independent (Device/Increase time);HOB elevated        Transfers Overall transfer level: Needs assistance Equipment used: Rolling walker (2 wheeled) Transfers: Sit to/from Stand Sit to Stand: Min guard         General transfer comment: cuing for hand placement  Ambulation/Gait Ambulation/Gait assistance: Min guard;Supervision (CGA fade to supervision) Gait Distance (Feet): 10 Feet Assistive device: Rolling walker (2 wheeled) Gait Pattern/deviations: Decreased step length - left;Decreased step length - right;Decreased stride length Gait velocity: decreased   General Gait Details: demo & education for [facility] RLE  Stairs            Wheelchair Mobility    Modified Rankin (Stroke Patients Only)       Balance Overall balance assessment: Needs assistance Sitting-balance support: Feet supported Sitting balance-Leahy Scale: Normal     Standing balance support: Bilateral upper extremity supported;During functional activity Standing balance-Leahy Scale: Fair                               Pertinent Vitals/Pain Pain Assessment: No/denies pain    Home Living Family/patient expects to be discharged to:: Private residence Living Arrangements: Spouse/significant other;Children Available Help at Discharge: Family;Available 24 hours/day Type of Home: House Home Access: Stairs to enter Entrance Stairs-Rails: Right;Left (wide set) Entrance Stairs-Number of Steps: 3 Home Layout: One level Home Equipment: Crutches;Walker - 2 wheels      Prior Function Level of Independence: Independent;Needs assistance   Gait / Transfers Assistance Needed: INDEP witth fxl mobility initially, but since last amputation, endorses being less mobile  except HH distances with wedge shoes and RW  ADL's / Homemaking Assistance Needed: Pt reports he is able to perform all ADLs, but requires IADL assist from wife as well as wound mgt-checking feet and dressing.        Hand  Dominance        Extremity/Trunk Assessment   Upper Extremity Assessment Upper Extremity Assessment: Overall WFL for tasks assessed    Lower Extremity Assessment Lower Extremity Assessment:  (c/o numbness in RLE following surgery, R foot not assessed as it was ace wrapped)       Communication   Communication: No difficulties  Cognition Arousal/Alertness: Awake/alert Behavior During Therapy: WFL for tasks assessed/performed Overall Cognitive Status: Within Functional Limits for tasks assessed                                 General Comments: oriented and appropriate.      General Comments      Exercises     Assessment/Plan    PT Assessment Patient needs continued PT services  PT Problem List Decreased mobility;Decreased strength;Decreased knowledge of precautions;Decreased activity tolerance;Decreased balance;Decreased skin integrity       PT Treatment Interventions DME instruction;Therapeutic activities;Modalities;Patient/family education;Therapeutic exercise;Gait training;Stair training;Balance training;Functional mobility training;Manual techniques;Neuromuscular re-education;Wheelchair mobility training    PT Goals (Current goals can be found in the Care Plan section)  Acute Rehab PT Goals Patient Stated Goal: get better, go home PT Goal Formulation: With patient Time For Goal Achievement: 01/13/21 Potential to Achieve Goals: Good    Frequency 7X/week   Barriers to discharge Inaccessible home environment 3 steps to enter home    Co-evaluation               AM-PAC PT "6 Clicks" Mobility  Outcome Measure Help needed turning from your back to your side while in a flat bed without using bedrails?: None Help needed moving from lying on your back to sitting on the side of a flat bed without using bedrails?: None Help needed moving to and from a bed to a chair (including a wheelchair)?: A Little Help needed standing up from a chair using your  arms (e.g., wheelchair or bedside chair)?: A Little Help needed to walk in hospital room?: A Little Help needed climbing 3-5 steps with a railing? : A Lot 6 Click Score: 19    End of Session Equipment Utilized During Treatment: Gait belt Activity Tolerance: Patient tolerated treatment well Patient left: in chair;with nursing/sitter in room;with call bell/phone within reach;with chair alarm set Nurse Communication: Mobility status PT Visit Diagnosis: Other abnormalities of gait and mobility (R26.89);Muscle weakness (generalized) (M62.81)    Time: 3244-0102 PT Time Calculation (min) (ACUTE ONLY): 20 min   Charges:   PT Evaluation $PT Eval Low Complexity: 1 Low PT Treatments $Therapeutic Activity: 8-22 mins        Aleda Grana, PT, DPT 12/30/20, 1:02 PM   Sandi Mariscal 12/30/2020, 1:00 PM

## 2020-12-31 ENCOUNTER — Inpatient Hospital Stay: Payer: Self-pay

## 2020-12-31 LAB — ECHOCARDIOGRAM COMPLETE
AR max vel: 2.89 cm2
AV Peak grad: 7.1 mmHg
Ao pk vel: 1.33 m/s
Area-P 1/2: 5.31 cm2
S' Lateral: 3.81 cm

## 2020-12-31 LAB — COMPREHENSIVE METABOLIC PANEL
ALT: 27 U/L (ref 0–44)
AST: 18 U/L (ref 15–41)
Albumin: 3.4 g/dL — ABNORMAL LOW (ref 3.5–5.0)
Alkaline Phosphatase: 86 U/L (ref 38–126)
Anion gap: 8 (ref 5–15)
BUN: 10 mg/dL (ref 6–20)
CO2: 24 mmol/L (ref 22–32)
Calcium: 8.8 mg/dL — ABNORMAL LOW (ref 8.9–10.3)
Chloride: 107 mmol/L (ref 98–111)
Creatinine, Ser: 0.76 mg/dL (ref 0.61–1.24)
GFR, Estimated: >60 mL/min (ref >60)
Glucose, Bld: 93 mg/dL (ref 70–99)
Potassium: 3.5 mmol/L (ref 3.5–5.1)
Sodium: 139 mmol/L (ref 135–145)
Total Bilirubin: 0.8 mg/dL (ref 0.3–1.2)
Total Protein: 6.7 g/dL (ref 6.5–8.1)

## 2020-12-31 LAB — C-REACTIVE PROTEIN: CRP: 2.3 mg/dL — ABNORMAL HIGH (ref <1.0)

## 2020-12-31 LAB — CBC WITH DIFFERENTIAL/PLATELET
Abs Immature Granulocytes: 0.04 10*3/uL (ref 0.00–0.07)
Basophils Absolute: 0.1 10*3/uL (ref 0.0–0.1)
Basophils Relative: 1 %
Eosinophils Absolute: 0.3 10*3/uL (ref 0.0–0.5)
Eosinophils Relative: 4 %
HCT: 38 % — ABNORMAL LOW (ref 39.0–52.0)
Hemoglobin: 13.4 g/dL (ref 13.0–17.0)
Immature Granulocytes: 1 %
Lymphocytes Relative: 18 %
Lymphs Abs: 1.6 10*3/uL (ref 0.7–4.0)
MCH: 33.5 pg (ref 26.0–34.0)
MCHC: 35.3 g/dL (ref 30.0–36.0)
MCV: 95 fL (ref 80.0–100.0)
Monocytes Absolute: 1 10*3/uL (ref 0.1–1.0)
Monocytes Relative: 11 %
Neutro Abs: 5.8 10*3/uL (ref 1.7–7.7)
Neutrophils Relative %: 65 %
Platelets: 197 10*3/uL (ref 150–400)
RBC: 4 MIL/uL — ABNORMAL LOW (ref 4.22–5.81)
RDW: 12.7 % (ref 11.5–15.5)
WBC: 8.8 10*3/uL (ref 4.0–10.5)
nRBC: 0 % (ref 0.0–0.2)

## 2020-12-31 LAB — GLUCOSE, CAPILLARY
Glucose-Capillary: 138 mg/dL — ABNORMAL HIGH (ref 70–99)
Glucose-Capillary: 120 mg/dL — ABNORMAL HIGH (ref 70–99)
Glucose-Capillary: 184 mg/dL — ABNORMAL HIGH (ref 70–99)
Glucose-Capillary: 114 mg/dL — ABNORMAL HIGH (ref 70–99)

## 2020-12-31 LAB — BRAIN NATRIURETIC PEPTIDE: B Natriuretic Peptide: 21 pg/mL (ref 0.0–100.0)

## 2020-12-31 LAB — MAGNESIUM: Magnesium: 1.9 mg/dL (ref 1.7–2.4)

## 2020-12-31 LAB — PROCALCITONIN: Procalcitonin: <0.1 ng/mL

## 2020-12-31 MED ORDER — SODIUM CHLORIDE 0.9 % IV SOLN
2.0000 g | Freq: Three times a day (TID) | INTRAVENOUS | Status: DC
  Administered 2020-12-31 – 2021-01-01 (×3): 2 g via INTRAVENOUS
  Filled 2020-12-31 (×5): qty 2

## 2020-12-31 NOTE — Progress Notes (Addendum)
Occupational Therapy Treatment Patient Details Name: Brendan Pruitt MRN: [id number] DOB: 11/13/1975 Today's Date: 12/31/2020    History of present illness Pt is a 45 y/o M with PMH: HTN, HLD, DM, L great toe amputation, and recent hospitalization 7/4-7/8 for sepsis 2/2 right foot ulcer and fifth metatarsal osteomyelitis undergoing I&D with excision of fifth metatarsal head. Pt presents this admission from podiatry office d/t concern for R foot osteomylitis. Confirmed on MRI. Pt underwent R partial 5th ray amputation, application of antibiotic beads, & rotational skin flap closure on 12/30/20 by Dr. Excell Seltzer.   OT comments  Pt. Education was provided about BUE strengthening with green theraband. Pt. was able to perform bilateral shoulder flexion, diagonal abduction, external rotation, horizontal abduction, elbow flexion, and extension following a HEP with verbal cues, and cues for proper technique. Pt. Tolerated the green resistive exercises well, at a good pace. Pt. Was provided with green theraband, and a HEP. Pt. Could benefit from OT services for ADL training, A/E training, UE there. Ex, and pt. Education about home modification, and DME.    Follow Up Recommendations  Home health OT    Equipment Recommendations  3 in 1 bedside commode;Tub/shower seat;Other (comment)    Recommendations for Other Services      Precautions / Restrictions Precautions Precautions: Fall Restrictions Weight Bearing Restrictions: Yes RLE Weight Bearing: Partial weight bearing Other Position/Activity Restrictions: heel wt bearing with gait distances limited. Recommended pt use RW at all times. off loading shoe in room       Mobility Bed Mobility Overal bed mobility: Independent             General bed mobility comments: Pt. up in chair upon arrival    Transfers Overall transfer level: Needs assistance Equipment used: Rolling walker (2 wheeled) Transfers: Sit to/from Stand Sit to Stand: Modified  independent (Device/Increase time)             Balance Overall balance assessment: Needs assistance;Modified Independent Sitting-balance support: Feet supported Sitting balance-Leahy Scale: Normal     Standing balance support: Bilateral upper extremity supported;During functional activity Standing balance-Leahy Scale: Good m                           ADL either performed or assessed with clinical judgement   ADL Overall ADL's : Needs assistance/impaired         Upper Body Bathing: Set up   Lower Body Bathing: Set up;Supervison/ safety   Upper Body Dressing : Set up   Lower Body Dressing: Supervision/safety;Set up                       Vision       Perception     Praxis      Cognition Arousal/Alertness: Awake/alert Behavior During Therapy: WFL for tasks assessed/performed Overall Cognitive Status: Within Functional Limits for tasks assessed                                 General Comments: Pt is A and O x 4 and extremely motivated        Exercises     Shoulder Instructions       General Comments      Pertinent Vitals/ Pain       Pain Assessment: No/denies pain Pain Score: 6  Faces Pain Scale: No hurt Pain Location: R foot Pain Descriptors /  Indicators: Numbness;Tingling Pain Intervention(s): Limited activity within patient's tolerance  Home Living                                          Prior Functioning/Environment              Frequency  Min 1X/week        Progress Toward Goals  OT Goals(current goals can now be found in the care plan section)  Progress towards OT goals: Progressing toward goals  Acute Rehab OT Goals Patient Stated Goal: get better, go home OT Goal Formulation: With patient Potential to Achieve Goals: Good  Plan      Co-evaluation                 AM-PAC OT "6 Clicks" Daily Activity     Outcome Measure   Help from another person eating  meals?: None Help from another person taking care of personal grooming?: None Help from another person toileting, which includes using toliet, bedpan, or urinal?: A Little Help from another person bathing (including washing, rinsing, drying)?: A Little Help from another person to put on and taking off regular upper body clothing?: None Help from another person to put on and taking off regular lower body clothing?: A Little 6 Click Score: 21    End of Session Equipment Utilized During Treatment: Gait belt;Rolling walker  OT Visit Diagnosis: Unsteadiness on feet (R26.81);Muscle weakness (generalized) (M62.81)   Activity Tolerance Patient tolerated treatment well   Patient Left in bed;with call bell/phone within reach   Nurse Communication Mobility status;Other (comment)        Time: 4656-8127 OT Time Calculation (min): 15 min  Charges: OT Treatments $Self Care/Home Management : 8-22 mins  Olegario Messier, MS, OTR/L    Olegario Messier 12/31/2020, 2:40 PM

## 2020-12-31 NOTE — Progress Notes (Signed)
PODIATRY / FOOT AND ANKLE SURGERY PROGRESS NOTE  Chief Complaint: Right foot wounds/infection   HPI: Brendan Pruitt is a 45 y.o. male who presents with status post 1 day right partial fifth ray amputation with placement of antibiotic beads and rotational skin flap closure.  Patient complains of minimal pain overall to the area of the procedure site.  Patient has been partial weightbearing with heel contact shoe for short distances.  Patient is working with PT today when entering the room.  Patient denies nausea, vomiting, fevers, chills.  PMHx:  Past Medical History:  Diagnosis Date   Acute osteomyelitis (HCC) 10/2019   phalanx left foot   Cellulitis and abscess of foot 10/2019   left.  using betadine wet to dry dressings.  treated with multiple antibiotics. 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   Hypertension    Neuromuscular disorder (HCC)    diabetic neuropathy    Surgical Hx:  Past Surgical History:  Procedure Laterality Date   AMPUTATION Right 12/30/2020   Procedure: Partial 5th Ray AMPUTATION;  Surgeon: Rosetta Posner, DPM;  Location: [facility];  Service: Podiatry;  Laterality: Right;   AMPUTATION TOE Left 11/06/2019   Procedure: AMPUTATION TOE IPJ LEFT HALLUX;  Surgeon: Rosetta Posner, DPM;  Location: [facility];  Service: Podiatry;  Laterality: Left;   CATARACT EXTRACTION W/PHACO Right 09/21/2020   Procedure: CATARACT EXTRACTION PHACO AND INTRAOCULAR LENS PLACEMENT (IOC) RIGHT DIABETIC;  Surgeon: Lockie Mola, MD;  Location: [facility];  Service: Ophthalmology;  Laterality: Right;  Diabetic - oral meds cde 9.48 01:48.8 minutes 8.7%   CATARACT EXTRACTION W/PHACO Left 10/12/2020   Procedure: CATARACT EXTRACTION PHACO AND INTRAOCULAR LENS PLACEMENT (IOC) LEFT DIABETIC 8.16 01:37.0;  Surgeon: Lockie Mola, MD;  Location: [facility];   Service: Ophthalmology;  Laterality: Left;  Diabetic - oral meds   I & D EXTREMITY Right 11/14/2020   Procedure: IRRIGATION AND DEBRIDEMENT EXTREMITY;  Surgeon: Linus Galas, DPM;  Location: [facility];  Service: Podiatry;  Laterality: Right;   LUMBAR DISC SURGERY  2010   no metal   UMBILICAL HERNIA REPAIR  2010   mesh     FHx:  Family History  Problem Relation Age of Onset   Diabetes Mother    Diabetes Sister     Social History:  reports that he quit smoking about 6 years ago. His smoking use included cigarettes. He has a 1.00 pack-year smoking history. He has never used smokeless tobacco. He reports current alcohol use of about 20.0 standard drinks per week. He reports that he does not use drugs.  Allergies:  Allergies  Allergen Reactions   Other Itching    Brown Paper towels    Medications Prior to Admission  Medication Sig Dispense Refill   acetaminophen (TYLENOL) 500 MG tablet Take 500 mg by mouth every 6 (six) hours as needed for mild pain, moderate pain or headache.     dapagliflozin propanediol (FARXIGA) 10 MG TABS tablet Take 10 mg by mouth daily.     gabapentin (NEURONTIN) 300 MG capsule Take 1 capsule by mouth 2 (two) times daily.     glipiZIDE (GLUCOTROL XL) 10 MG 24 hr tablet Take 10 mg by mouth 2 (two) times daily.     levothyroxine (SYNTHROID) 25 MCG tablet Take 25 mcg by mouth daily.     losartan (COZAAR) 25 MG tablet Take  25 mg by mouth daily.     metFORMIN (GLUCOPHAGE) 1000 MG tablet Take 1,000 mg by mouth 2 (two) times daily with a meal.      Multiple Vitamin (ONE-A-DAY MENS PO) Take 1 tablet by mouth daily.     mupirocin ointment (BACTROBAN) 2 % Apply 1 application topically in the morning and at bedtime.     Omega-3 Fatty Acids (OMEGA 3 PO) Take 1,080 mg by mouth in the morning and at bedtime.     OZEMPIC, 1 MG/DOSE, 4 MG/3ML SOPN Inject 1 mg into the skin every Sunday.      SANTYL ointment Apply topically.     traMADol (ULTRAM) 50 MG tablet Take 50 mg by mouth  3 (three) times daily as needed.     amoxicillin-clavulanate (AUGMENTIN) 875-125 MG tablet Take 1 tablet by mouth 2 (two) times daily. (Patient not taking: Reported on 12/29/2020) 28 tablet 1   nirmatrelvir & ritonavir (PAXLOVID) 20 x 150 MG & 10 x 100MG  TBPK Take by mouth. (Patient not taking: Reported on 12/29/2020)      Physical Exam: General: Alert and oriented.  No apparent distress.  Vascular: DP/PT pulses palpable bilateral, hair growth noted to digits of both feet bilateral.  Capillary fill time intact to digits both feet.  Mild edema present to the right lateral foot consistent with postoperative course, no associated erythema.  Neuro: Light touch sensation nearly absent to bilateral lower extremities.  Derm: Right partial fifth ray amputation site appears to have skin edges well coapted with sutures intact, capillary fill time appears to be intact to flaps that were created for skin closure.  Mild edema, minimal erythema, minimal serous drainage, no odor noted.  Right plantar fourth metatarsal phalangeal joint wounds x2 appear to be stable at this time.  No signs of infection present, wound bases appear to be granular with minimal depth.    MSK: Left partial hallux amputation, right partial fifth ray amputation.  Results for orders placed or performed during the hospital encounter of 12/29/20 (from the past 48 hour(s))  CBG monitoring, ED     Status: Abnormal   Collection Time: 12/29/20 12:10 PM  Result Value Ref Range   Glucose-Capillary 121 (H) 70 - 99 mg/dL    Comment: Glucose reference range applies only to samples taken after fasting for at least 8 hours.  Procalcitonin - Baseline     Status: None   Collection Time: 12/29/20 12:34 PM  Result Value Ref Range   Procalcitonin <0.10 ng/mL    Comment:        Interpretation: PCT (Procalcitonin) <= 0.5 ng/mL: Systemic infection (sepsis) is not likely. Local bacterial infection is possible. (NOTE)       Sepsis PCT Algorithm            Lower Respiratory Tract                                      Infection PCT Algorithm    ----------------------------     ----------------------------         PCT < 0.25 ng/mL                PCT < 0.10 ng/mL          Strongly encourage             Strongly discourage   discontinuation of antibiotics    initiation of antibiotics    ----------------------------     -----------------------------  PCT 0.25 - 0.50 ng/mL            PCT 0.10 - 0.25 ng/mL               OR       >80% decrease in PCT            Discourage initiation of                                            antibiotics      Encourage discontinuation           of antibiotics    ----------------------------     -----------------------------         PCT >= 0.50 ng/mL              PCT 0.26 - 0.50 ng/mL               AND        <80% decrease in PCT             Encourage initiation of                                             antibiotics       Encourage continuation           of antibiotics    ----------------------------     -----------------------------        PCT >= 0.50 ng/mL                  PCT > 0.50 ng/mL               AND         increase in PCT                  Strongly encourage                                      initiation of antibiotics    Strongly encourage escalation           of antibiotics                                     -----------------------------                                           PCT <= 0.25 ng/mL                                                 OR                                        > 80% decrease in PCT  Discontinue / Do not initiate                                             antibiotics  Performed at [facility], [address] [place], [place] [address]   C-reactive protein     Status: Abnormal   Collection Time: 12/29/20 12:34 PM  Result Value Ref Range   CRP 1.0 (H) <1.0 mg/dL    Comment: Performed at [facility], [address], [place], [place] [address]  Type and screen     Status: None   Collection Time: 12/29/20 12:34 PM  Result Value Ref Range   ABO/RH(D) O POS    Antibody Screen NEG    Sample Expiration      01/01/2021,2359 Performed at [facility], [address] [place], [place] [address]   Protime-INR     Status: None   Collection Time: 12/29/20 12:34 PM  Result Value Ref Range   Prothrombin Time 13.6 11.4 - 15.2 seconds   INR 1.0 0.8 - 1.2    Comment: (NOTE) INR goal varies based on device and disease states. Performed at [facility], [address] [place], [place] [address]   CBG monitoring, ED     Status: Abnormal   Collection Time: 12/29/20  5:47 PM  Result Value Ref Range   Glucose-Capillary 133 (H) 70 - 99 mg/dL    Comment: Glucose reference range applies only to samples taken after fasting for at least 8 hours.  Troponin I (High Sensitivity)     Status: None   Collection Time: 12/29/20  8:36 PM  Result Value Ref Range   Troponin I (High Sensitivity) 2 <18 ng/L    Comment: (NOTE) Elevated high sensitivity troponin I (hsTnI) values and significant  changes across serial measurements may suggest ACS but many other  chronic and acute conditions are known to elevate hsTnI results.  Refer to the "Links" section for chest pain algorithms and additional  guidance. Performed at [facility], [address] [place], [place] [address]   Glucose, capillary     Status: Abnormal   Collection Time: 12/29/20 10:04 PM  Result Value Ref Range   Glucose-Capillary 121 (H) 70 - 99 mg/dL    Comment: Glucose reference range applies only to samples taken after fasting for at least 8 hours.   Comment 1 Notify RN   Surgical PCR screen     Status: None   Collection Time: 12/29/20 10:13 PM   Specimen: Nasal Mucosa; Nasal Swab  Result Value Ref Range   MRSA, PCR NEGATIVE NEGATIVE   Staphylococcus aureus NEGATIVE NEGATIVE    Comment: (NOTE) The  Xpert SA Assay (FDA approved for NASAL specimens in patients 41 years of age and older), is one component of a comprehensive surveillance program. It is not intended to diagnose infection nor to guide or monitor treatment. Performed at [facility], [address] [place], [place] [address]   Troponin I (High Sensitivity)     Status: None   Collection Time: 12/29/20 10:37 PM  Result Value Ref Range   Troponin I (High Sensitivity) 3 <18 ng/L    Comment: (NOTE) Elevated high sensitivity troponin I (hsTnI) values and significant  changes across serial measurements may suggest ACS but many other  chronic and acute conditions are known to elevate hsTnI results.  Refer to the "Links" section for chest pain algorithms and additional  guidance. Performed at [facility], [address] [place], [place] [address]   Procalcitonin     Status: None   Collection Time: 12/30/20  3:07 AM  Result Value Ref Range   Procalcitonin <0.10 ng/mL    Comment:        Interpretation: PCT (Procalcitonin) <= 0.5 ng/mL: Systemic infection (sepsis) is not likely. Local bacterial infection is possible. (NOTE)       Sepsis PCT Algorithm           Lower Respiratory Tract                                      Infection PCT Algorithm    ----------------------------     ----------------------------         PCT < 0.25 ng/mL                PCT < 0.10 ng/mL          Strongly encourage             Strongly discourage   discontinuation of antibiotics    initiation of antibiotics    ----------------------------     -----------------------------       PCT 0.25 - 0.50 ng/mL            PCT 0.10 - 0.25 ng/mL               OR       >80% decrease in PCT            Discourage initiation of                                            antibiotics      Encourage discontinuation           of antibiotics    ----------------------------     -----------------------------         PCT >= 0.50 ng/mL               PCT 0.26 - 0.50 ng/mL               AND        <80% decrease in PCT             Encourage initiation of                                             antibiotics       Encourage continuation           of antibiotics    ----------------------------     -----------------------------        PCT >= 0.50 ng/mL                  PCT > 0.50 ng/mL               AND         increase in PCT                  Strongly encourage  initiation of antibiotics    Strongly encourage escalation           of antibiotics                                     -----------------------------                                           PCT <= 0.25 ng/mL                                                 OR                                        > 80% decrease in PCT                                      Discontinue / Do not initiate                                             antibiotics  Performed at [facility], [address] [place], [place] [address]   Magnesium     Status: None   Collection Time: 12/30/20  3:07 AM  Result Value Ref Range   Magnesium 2.2 1.7 - 2.4 mg/dL    Comment: Performed at [facility], [address] [place], [place] [address]  C-reactive protein     Status: Abnormal   Collection Time: 12/30/20  3:07 AM  Result Value Ref Range   CRP 1.4 (H) <1.0 mg/dL    Comment: Performed at [facility], [address] [place], [place] [address]  Comprehensive metabolic panel     Status: None   Collection Time: 12/30/20  3:07 AM  Result Value Ref Range   Sodium 138 135 - 145 mmol/L   Potassium 3.6 3.5 - 5.1 mmol/L   Chloride 105 98 - 111 mmol/L   CO2 26 22 - 32 mmol/L   Glucose, Bld 84 70 - 99 mg/dL    Comment: Glucose reference range applies only to samples taken after fasting for at least 8 hours.   BUN 11 6 - 20 mg/dL   Creatinine, Ser 2.84 0.61 - 1.24 mg/dL   Calcium 8.9 8.9 - 13.2 mg/dL   Total Protein 7.2 6.5 - 8.1 g/dL    Albumin 3.8 3.5 - 5.0 g/dL   AST 22 15 - 41 U/L   ALT 31 0 - 44 U/L   Alkaline Phosphatase 91 38 - 126 U/L   Total Bilirubin 1.0 0.3 - 1.2 mg/dL   GFR, Estimated >44 >01 mL/min    Comment: (NOTE) Calculated using the CKD-EPI Creatinine Equation (2021)    Anion gap 7 5 - 15    Comment: Performed at [facility], [address] [place], [place] [address]  CBC with Differential/Platelet  Status: None   Collection Time: 12/30/20  3:07 AM  Result Value Ref Range   WBC 6.6 4.0 - 10.5 K/uL   RBC 4.37 4.22 - 5.81 MIL/uL   Hemoglobin 14.1 13.0 - 17.0 g/dL   HCT 80.3 21.2 - 24.8 %   MCV 92.7 80.0 - 100.0 fL   MCH 32.3 26.0 - 34.0 pg   MCHC 34.8 30.0 - 36.0 g/dL   RDW 25.0 03.7 - 04.8 %   Platelets 216 150 - 400 K/uL   nRBC 0.0 0.0 - 0.2 %   Neutrophils Relative % 68 %   Neutro Abs 4.5 1.7 - 7.7 K/uL   Lymphocytes Relative 15 %   Lymphs Abs 1.0 0.7 - 4.0 K/uL   Monocytes Relative 11 %   Monocytes Absolute 0.8 0.1 - 1.0 K/uL   Eosinophils Relative 4 %   Eosinophils Absolute 0.3 0.0 - 0.5 K/uL   Basophils Relative 1 %   Basophils Absolute 0.1 0.0 - 0.1 K/uL   Immature Granulocytes 1 %   Abs Immature Granulocytes 0.03 0.00 - 0.07 K/uL    Comment: Performed at [facility], [address] [place], [place] [address]  Brain natriuretic peptide     Status: None   Collection Time: 12/30/20  3:07 AM  Result Value Ref Range   B Natriuretic Peptide 18.6 0.0 - 100.0 pg/mL    Comment: Performed at [facility], [address] [place], [place] [address]  Glucose, capillary     Status: Abnormal   Collection Time: 12/30/20  5:10 AM  Result Value Ref Range   Glucose-Capillary 122 (H) 70 - 99 mg/dL    Comment: Glucose reference range applies only to samples taken after fasting for at least 8 hours.   Comment 1 Notify RN   Aerobic/Anaerobic Culture w Gram Stain (surgical/deep wound)     Status: None (Preliminary result)   Collection Time: 12/30/20  8:02 AM    Specimen: Other Source; Tissue  Result Value Ref Range   Specimen Description      WOUND Performed at [facility], [address] [place], [place] [address]    Special Requests      RIGHT FOOT WOUND Performed at [facility], [address] [place], [place] [address]    Gram Stain      NO WBC SEEN NO ORGANISMS SEEN Performed at [facility], [address], [place], [place] [address]    Culture PENDING    Report Status PENDING   Aerobic/Anaerobic Culture w Gram Stain (surgical/deep wound)     Status: None (Preliminary result)   Collection Time: 12/30/20  8:09 AM   Specimen: Other Source; Tissue  Result Value Ref Range   Specimen Description      TISSUE Performed at [facility], [address], [place], [place] [address]    Special Requests      RIGHT 5TH METATERSAL BONE CULTURE Performed at [facility], [address] [place], [place] [address]    Gram Stain      NO WBC SEEN NO ORGANISMS SEEN Performed at [facility], [address], [place], [place] [address]    Culture PENDING    Report Status PENDING   Glucose, capillary     Status: None   Collection Time: 12/30/20  8:48 AM  Result Value Ref Range   Glucose-Capillary 99 70 - 99 mg/dL    Comment: Glucose reference range applies only to samples taken  after fasting for at least 8 hours.  Glucose, capillary     Status: Abnormal   Collection Time: 12/30/20 11:55 AM  Result Value Ref Range   Glucose-Capillary 132 (H) 70 - 99 mg/dL    Comment: Glucose reference range applies only to samples taken after fasting for at least 8 hours.  Glucose, capillary     Status: Abnormal   Collection Time: 12/30/20  4:27 PM  Result Value Ref Range   Glucose-Capillary 137 (H) 70 - 99 mg/dL    Comment: Glucose reference range applies only to samples taken after fasting for at least 8 hours.  Glucose, capillary     Status: Abnormal   Collection Time: 12/30/20  8:41 PM  Result  Value Ref Range   Glucose-Capillary 172 (H) 70 - 99 mg/dL    Comment: Glucose reference range applies only to samples taken after fasting for at least 8 hours.   Comment 1 Notify RN   Procalcitonin     Status: None   Collection Time: 12/31/20  4:16 AM  Result Value Ref Range   Procalcitonin <0.10 ng/mL    Comment:        Interpretation: PCT (Procalcitonin) <= 0.5 ng/mL: Systemic infection (sepsis) is not likely. Local bacterial infection is possible. (NOTE)       Sepsis PCT Algorithm           Lower Respiratory Tract                                      Infection PCT Algorithm    ----------------------------     ----------------------------         PCT < 0.25 ng/mL                PCT < 0.10 ng/mL          Strongly encourage             Strongly discourage   discontinuation of antibiotics    initiation of antibiotics    ----------------------------     -----------------------------       PCT 0.25 - 0.50 ng/mL            PCT 0.10 - 0.25 ng/mL               OR       >80% decrease in PCT            Discourage initiation of                                            antibiotics      Encourage discontinuation           of antibiotics    ----------------------------     -----------------------------         PCT >= 0.50 ng/mL              PCT 0.26 - 0.50 ng/mL               AND        <80% decrease in PCT             Encourage initiation of  antibiotics       Encourage continuation           of antibiotics    ----------------------------     -----------------------------        PCT >= 0.50 ng/mL                  PCT > 0.50 ng/mL               AND         increase in PCT                  Strongly encourage                                      initiation of antibiotics    Strongly encourage escalation           of antibiotics                                     -----------------------------                                           PCT <=  0.25 ng/mL                                                 OR                                        > 80% decrease in PCT                                      Discontinue / Do not initiate                                             antibiotics  Performed at [facility], [address], [place], [place] [address]   Magnesium     Status: None   Collection Time: 12/31/20  4:16 AM  Result Value Ref Range   Magnesium 1.9 1.7 - 2.4 mg/dL    Comment: Performed at [facility], [address] [place], [place] [address]  C-reactive protein     Status: Abnormal   Collection Time: 12/31/20  4:16 AM  Result Value Ref Range   CRP 2.3 (H) <1.0 mg/dL    Comment: Performed at [facility], [address] [place], [place] [address]  Comprehensive metabolic panel     Status: Abnormal   Collection Time: 12/31/20  4:16 AM  Result Value Ref Range   Sodium 139 135 - 145 mmol/L   Potassium 3.5 3.5 - 5.1 mmol/L   Chloride 107 98 - 111 mmol/L   CO2 24 22 - 32 mmol/L   Glucose, Bld 93 70 - 99 mg/dL  Comment: Glucose reference range applies only to samples taken after fasting for at least 8 hours.   BUN 10 6 - 20 mg/dL   Creatinine, Ser 4.09 0.61 - 1.24 mg/dL   Calcium 8.8 (L) 8.9 - 10.3 mg/dL   Total Protein 6.7 6.5 - 8.1 g/dL   Albumin 3.4 (L) 3.5 - 5.0 g/dL   AST 18 15 - 41 U/L   ALT 27 0 - 44 U/L   Alkaline Phosphatase 86 38 - 126 U/L   Total Bilirubin 0.8 0.3 - 1.2 mg/dL   GFR, Estimated >81 >19 mL/min    Comment: (NOTE) Calculated using the CKD-EPI Creatinine Equation (2021)    Anion gap 8 5 - 15    Comment: Performed at [facility], [address] [place], [place] [address]  CBC with Differential/Platelet     Status: Abnormal   Collection Time: 12/31/20  4:16 AM  Result Value Ref Range   WBC 8.8 4.0 - 10.5 K/uL   RBC 4.00 (L) 4.22 - 5.81 MIL/uL   Hemoglobin 13.4 13.0 - 17.0 g/dL   HCT 95.6 (L) 21.3 - 08.6 %   MCV 95.0 80.0 - 100.0 fL   MCH  33.5 26.0 - 34.0 pg   MCHC 35.3 30.0 - 36.0 g/dL   RDW 57.8 46.9 - 62.9 %   Platelets 197 150 - 400 K/uL   nRBC 0.0 0.0 - 0.2 %   Neutrophils Relative % 65 %   Neutro Abs 5.8 1.7 - 7.7 K/uL   Lymphocytes Relative 18 %   Lymphs Abs 1.6 0.7 - 4.0 K/uL   Monocytes Relative 11 %   Monocytes Absolute 1.0 0.1 - 1.0 K/uL   Eosinophils Relative 4 %   Eosinophils Absolute 0.3 0.0 - 0.5 K/uL   Basophils Relative 1 %   Basophils Absolute 0.1 0.0 - 0.1 K/uL   Immature Granulocytes 1 %   Abs Immature Granulocytes 0.04 0.00 - 0.07 K/uL    Comment: Performed at [facility], [address] [place], [place] [address]  Brain natriuretic peptide     Status: None   Collection Time: 12/31/20  4:16 AM  Result Value Ref Range   B Natriuretic Peptide 21.0 0.0 - 100.0 pg/mL    Comment: Performed at [facility], [address] [place], [place] [address]  Glucose, capillary     Status: Abnormal   Collection Time: 12/31/20  7:27 AM  Result Value Ref Range   Glucose-Capillary 138 (H) 70 - 99 mg/dL    Comment: Glucose reference range applies only to samples taken after fasting for at least 8 hours.   Comment 1 Notify RN    Comment 2 Document in Chart    US ARTERIAL ABI (SCREENING LOWER EXTREMITY)  Result Date: 12/29/2020 CLINICAL DATA:  History of peripheral arterial disease, toe amputation EXAM: NONINVASIVE PHYSIOLOGIC VASCULAR STUDY OF BILATERAL LOWER EXTREMITIES TECHNIQUE: Evaluation of both lower extremities were performed at rest, including calculation of ankle-brachial indices with single level Doppler, pressure and pulse volume recording. COMPARISON:  None. FINDINGS: Right ABI:  1.54 Left ABI:  1.46 Right Lower Extremity:  Normal arterial waveforms at the ankle. Left Lower Extremity:  Normal arterial waveforms at the ankle. 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 Normal arterial waveforms noted in both lower extremities by Doppler. Electronically Signed   By: Olive Bass M.D.   On: 12/29/2020 11:42  DG Foot 2 Views Right  Result Date: 12/30/2020 CLINICAL DATA:  Status post right fifth metatarsal amputation EXAM: RIGHT FOOT - 2 VIEW COMPARISON:  Radiograph 11/14/2020 FINDINGS: Postsurgical changes of partial fifth ray amputation with residual base of fifth metatarsal. There are tiny radiopaque pellets in the surgical site, likely antibiotic beads. Expected postsurgical soft tissue changes. Vascular calcifications. IMPRESSION: Postsurgical changes of partial fifth ray metatarsal amputation with antibiotic bead placement. Expected postsurgical soft tissue changes. Electronically Signed   By: Caprice RenshawJacob  Kahn M.D.   On: 12/30/2020 13:08    Blood pressure 118/62, pulse 93, temperature 98.8 F (37.1 C), resp. rate 18, height 5\' 5"  (1.651 m), weight 90.7 kg, SpO2 99 %.  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 -X-ray imaging reviewed discussed with patient in detail from postoperative procedure. -Foot appears to be very stable at this time as there appears to be minimal erythema, mild edema consistent with postoperative course, no dehiscence present to the surgical site currently.  Wounds also present to the plantar fourth metatarsal phalange joint appeared to be stable with no signs of infection present. -Appreciate infectious disease recommendations for antibiotic therapy.  So far bone culture and wound culture have not grown anything to date.  Pathology specimen pending.  Still would likely recommend proceeding with IV antibiotics upon  discharge due to potential for residual fifth metatarsal base infection based on MRI imaging.  Would like to try to save this as if this area gets removed it could lead to an equinovarus contracture which could ultimately affect the patient's foot function and could require further surgery in the future. -Redressed today with Xeroform to the incision line followed by 4 x 4 gauze, ABD, Kerlix, Ace wrap.  Patient to keep dressings clean, dry, and intact.  We will see again on Monday for 1 last dressing change before discharge most likely. -Patient to continue with partial weightbearing with heel contact and surgical shoe.  Patient to continue working with physical therapy. -ABIs reviewed, triphasic to biphasic flow noted but did have some elevated ABIs indicative of calcinosis of arteries.  This is also able to be seen on x-ray as multiple arteries are seen throughout the foot and ankle that are calcified.  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  Patient was recently seen by Dr. Gilda CreaseSchnier on an outpatient basis and no intervention was indicated at that time.  If patient has trouble healing the wound would likely recommend follow-up with vascular surgery for potential revascularization procedure.  Rosetta PosnerAndrew Indi [place], DPM 12/31/2020, 9:31 AM

## 2020-12-31 NOTE — Progress Notes (Signed)
Secure chat with Rolly Salter RN re PICC placement.  D/C home planned for Monday.  PICC will not be placed this shift.

## 2020-12-31 NOTE — Progress Notes (Signed)
Physical Therapy Treatment Patient Details Name: Brendan Pruitt MRN: [id number] DOB: 23-May-1975 Today's Date: 12/31/2020    History of Present Illness Pt is a 45 y/o M with PMH: HTN, HLD, DM, L great toe amputation, and recent hospitalization 7/4-7/8 for sepsis 2/2 right foot ulcer and fifth metatarsal osteomyelitis undergoing I&D with excision of fifth metatarsal head. Pt presents this admission from podiatry office d/t concern for R foot osteomylitis. Confirmed on MRI. Pt underwent R partial 5th ray amputation, application of antibiotic beads, & rotational skin flap closure on 12/30/20 by Dr. Excell Seltzer.    PT Comments    Pt was independently ambulating in room upon arriving. He just returned form BR. Discussed recommendation to use RW for assisting with keeping wt off foot for healing purposes. He states understanding. Podiatrist arrived for dressing change. When author returned, pt was easily able to stand and ambulate to doorway of room without safety concern. Author elected to push pt down to rehab gym in w/c for stair training. He was easily able to ascend/descend 4 stair with +1 rail. Will continue to follow and progress as able per POC.    Follow Up Recommendations  Home health PT;Supervision for mobility/OOB     Equipment Recommendations  Wheelchair (measurements PT);Wheelchair cushion (measurements PT);Rolling walker with 5" wheels;Other (comment) (will benifit form w/c for long distances. MD perfers pt to limit gait distance for healing purposes.)       Precautions / Restrictions Precautions Precautions: Fall Restrictions Weight Bearing Restrictions: Yes RLE Weight Bearing: Partial weight bearing Other Position/Activity Restrictions: heel wt bearing with gait distances limited. Recommended pt use RW at all times. off loading shoe in room    Mobility  Bed Mobility Overal bed mobility: Independent             General bed mobility comments: pt was independently ambulating  back form BR upon arriving. discussed need to use RW for keep wt off foot for proper healing    Transfers Overall transfer level: Needs assistance Equipment used: Rolling walker (2 wheeled) Transfers: Sit to/from Stand Sit to Stand: Modified independent (Device/Increase time)         General transfer comment: noi vcs or physical assistance required to stand from recliner or w/c.  Ambulation/Gait Ambulation/Gait assistance: Supervision Gait Distance (Feet): 20 Feet Assistive device: Rolling walker (2 wheeled)   Gait velocity: decreased   General Gait Details: Pt was easily and safely able to ambulate to doorway pof room where w/c was positioned. pushed pt to rehab gym for stair training.   Stairs Stairs: Yes Stairs assistance: Supervision Stair Management: One rail Left Number of Stairs: 4 General stair comments: pt easily and safely able to ascend/descend 4 stair with +1 rail. per pt, his rails are to wide for BUE use on 2 rails     Balance Overall balance assessment: Needs assistance;Modified Independent Sitting-balance support: Feet supported Sitting balance-Leahy Scale: Normal     Standing balance support: Bilateral upper extremity supported;During functional activity Standing balance-Leahy Scale: Good Standing balance comment: pt was I'ly ambulating in room without assistive device upon entering room       Cognition Arousal/Alertness: Awake/alert Behavior During Therapy: WFL for tasks assessed/performed Overall Cognitive Status: Within Functional Limits for tasks assessed      General Comments: Pt is A and O x 4 and extremely motivated             Pertinent Vitals/Pain Pain Assessment: No/denies pain Faces Pain Scale: No hurt     PT  Goals (current goals can now be found in the care plan section) Acute Rehab PT Goals Patient Stated Goal: get better, go home Progress towards PT goals: Progressing toward goals    Frequency    7X/week      PT  Plan Current plan remains appropriate       AM-PAC PT "6 Clicks" Mobility   Outcome Measure  Help needed turning from your back to your side while in a flat bed without using bedrails?: None Help needed moving from lying on your back to sitting on the side of a flat bed without using bedrails?: None Help needed moving to and from a bed to a chair (including a wheelchair)?: None Help needed standing up from a chair using your arms (e.g., wheelchair or bedside chair)?: A Little Help needed to walk in hospital room?: A Little Help needed climbing 3-5 steps with a railing? : A Little 6 Click Score: 21    End of Session Equipment Utilized During Treatment: Gait belt Activity Tolerance: Patient tolerated treatment well Patient left: in chair;with nursing/sitter in room;with call bell/phone within reach;with chair alarm set Nurse Communication: Mobility status PT Visit Diagnosis: Other abnormalities of gait and mobility (R26.89);Muscle weakness (generalized) (M62.81)     Time: 5053-9767 PT Time Calculation (min) (ACUTE ONLY): 11 min  Charges:  $Gait Training: 8-22 mins                     Jetta Lout PTA 12/31/20, 11:20 AM

## 2020-12-31 NOTE — Progress Notes (Signed)
PROGRESS NOTE                                                                                                                                                                                                             Patient Demographics:    Brendan Pruitt, is a 45 y.o. male, DOB - [birth year], DEY:[id number]  Outpatient Primary MD for the patient is Barbette Reichmann, MD    LOS - 2  Admit date - 12/29/2020    Chief Complaint  Patient presents with   Wound Infection    R 5th metatarsal       Brief Narrative (HPI from H&P)  Brendan Pruitt is a 45 y.o. male with medical history significant for hypertension, hyperlipidemia, diabetes mellitus, s/p left great toe amputation,  who was sent in by his podiatrist due to concerns for osteomyelitis, work-up suggestive of right foot abscess and osteomyelitis and he was admitted to the hospital.   Subjective:   Patient in bed, appears comfortable, denies any headache, no fever, no chest pain or pressure, no shortness of breath , no abdominal pain. No new focal weakness.    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  ID following currently on vancomycin, Rocephin and Flagyl combination.  PICC line to be inserted as patient likely to go home on prolonged IV antibiotics.  2.  Nonspecific EKG changes.  Similar to EKG from July 2016, chest pain-free, troponin negative x2, Echo stable EF 60% no WMA.Marland Kitchen  3. Obesity BMI 33, follow with PCP.  4.  Hypertension.  Blood pressure is  soft hold off all blood pressure medications for now and monitor.  5.  DM type II.  On sliding scale, will add Lantus.  Will monitor and adjust.  Holding home Meds Glucotrol and Glucophage along with [place].    Lab Results  Component Value Date   HGBA1C 8.1 (H) 12/29/2020    CBG (last 3)  Recent Labs    12/30/20 1627  12/30/20 2041 12/31/20 0727  GLUCAP 137* 172* 138*         Condition - Fair  Family Communication  :  None Present  Code Status :  Full  Consults  :  Podiatry  PUD Prophylaxis :    Procedures  :     TTE - EF 60%, no wall motion abnormality, chronic grade 1 diastolic dysfunction.    Right foot surgery by Dr. Rosetta Posner on 12/30/2020.  Right partial fifth ray amputation Rotational skin flap closure Application of antibiotic beads  ABI - good waveforms but inconclusive.  MRI R foot -  1. Osteomyelitis of the residual fifth metatarsal and fifth proximal phalanx shaft. 2. 1.5 cm soft tissue abscess surrounding the fifth metatarsal stump with overlying skin ulceration.      Disposition Plan  :    Status is: Inpatient  Remains inpatient appropriate because:IV treatments appropriate due to intensity of illness or inability to take PO  Dispo: The patient is from: Home              Anticipated d/c is to: Home              Patient currently is not medically stable to d/c.   Difficult to place patient No   DVT Prophylaxis  :  Lovenox    Lab Results  Component Value Date   PLT 197 12/31/2020    Diet :  Diet Order             Diet heart healthy/carb modified Room service appropriate? Yes; Fluid consistency: Thin  Diet effective now                    Inpatient Medications  Scheduled Meds:  enoxaparin (LOVENOX) injection  0.5 mg/kg Subcutaneous Q24H   insulin aspart  0-15 Units Subcutaneous TID WC   insulin aspart  0-5 Units Subcutaneous QHS   insulin glargine-yfgn  20 Units Subcutaneous Daily   levothyroxine  25 mcg Oral Daily    metroNIDAZOLE  500 mg Oral Q8H   mupirocin ointment  1 application Nasal BID   Continuous Infusions:  sodium chloride 10 mL/hr at 12/31/20 0354   cefTRIAXone (ROCEPHIN)  IV 2 g (12/31/20 0826)   vancomycin 1,000 mg (12/31/20 0636)   PRN Meds:.sodium chloride, acetaminophen **OR** acetaminophen, diphenhydrAMINE, HYDROcodone-acetaminophen, [DISCONTINUED] ondansetron **OR** ondansetron (ZOFRAN) IV  Antibiotics  :    Anti-infectives (From admission, onward)    Start     Dose/Rate Route Frequency Ordered Stop   12/31/20 0800  cefTRIAXone (ROCEPHIN) 2 g in sodium chloride 0.9 % 100 mL IVPB        2 g 200 mL/hr over 30 Minutes Intravenous Every 24 hours 12/30/20 2133     12/31/20 0600  metroNIDAZOLE (FLAGYL) tablet 500 mg        500 mg Oral Every 8 hours 12/30/20 2133     12/30/20 0842  vancomycin (VANCOCIN) powder  Status:  Discontinued          As needed 12/30/20 0843 12/30/20 0844   12/29/20 0600  vancomycin (VANCOCIN) IVPB 1000 mg/200 mL premix        1,000 mg 200 mL/hr over 60 Minutes Intravenous Every 12 hours 12/29/20 0426     12/29/20 0600  piperacillin-tazobactam (ZOSYN) IVPB 3.375 g        3.375 g 12.5 mL/hr over 240 Minutes Intravenous Every 8 hours 12/29/20 0427 12/31/20 0202   12/29/20 0130  piperacillin-tazobactam (ZOSYN) IVPB 3.375 g  3.375 g 100 mL/hr over 30 Minutes Intravenous  Once 12/29/20 0121 12/29/20 0319   12/29/20 0130  vancomycin (VANCOCIN) IVPB 1000 mg/200 mL premix        1,000 mg 200 mL/hr over 60 Minutes Intravenous  Once 12/29/20 0124 12/29/20 0428        Time Spent in minutes  30   Susa RaringPrashant Dillard Pascal M.D on 12/31/2020 at 11:39 AM  To page go to [email or web address]   [facility] -  Office  [phone]     See all Orders from today for further details    Objective:   Vitals:   12/31/20 0416 12/31/20 0646 12/31/20 0809 12/31/20 1119  BP: 95/67 (!) 131/95 118/62 96/76  Pulse: 94 88 93 89  Resp: 20  18 18   Temp: 99 F (37.2 C)   98.8 F (37.1 C) 97.7 F (36.5 C)  TempSrc: Oral     SpO2: 99% 99% 99% 99%  Weight:      Height:        Wt Readings from Last 3 Encounters:  12/28/20 90.7 kg  12/13/20 93.9 kg  11/29/20 93.9 kg     Intake/Output Summary (Last 24 hours) at 12/31/2020 1139 Last data filed at 12/31/2020 0354 Gross per 24 hour  Intake 1221.29 ml  Output --  Net 1221.29 ml     Physical Exam  Awake Alert, No new F.N deficits, Normal affect [place].AT,PERRAL Supple Neck,No JVD, No cervical lymphadenopathy appriciated.  Symmetrical Chest wall movement, Good air movement bilaterally, CTAB RRR,No Gallops, Rubs or new Murmurs, No Parasternal Heave +ve B.Sounds, Abd Soft, No tenderness, No organomegaly appriciated, No rebound - guarding or rigidity.  Left big toe amputation, right foot postop under bandage with right big toe amputated      Data Review:    CBC Recent Labs  Lab 12/28/20 1944 12/29/20 0656 12/30/20 0307 12/31/20 0416  WBC 10.2 6.1 6.6 8.8  HGB 13.4 13.5 14.1 13.4  HCT 38.4* 38.6* 40.5 38.0*  PLT 232 216 216 197  MCV 94.3 94.4 92.7 95.0  MCH 32.9 33.0 32.3 33.5  MCHC 34.9 35.0 34.8 35.3  RDW 12.7 12.6 12.8 12.7  LYMPHSABS 2.9  --  1.0 1.6  MONOABS 0.7  --  0.8 1.0  EOSABS 0.2  --  0.3 0.3  BASOSABS 0.0  --  0.1 0.1    Recent Labs  Lab 12/28/20 1944 12/29/20 0656 12/29/20 1234 12/30/20 0307 12/31/20 0416  NA 134*  --   --  138 139  K 4.0  --   --  3.6 3.5  CL 100  --   --  105 107  CO2 25  --   --  26 24  GLUCOSE 151*  --   --  84 93  BUN 10  --   --  11 10  CREATININE 0.80 0.62  --  0.81 0.76  CALCIUM 9.1  --   --  8.9 8.8*  AST 32  --   --  22 18  ALT 37  --   --  31 27  ALKPHOS 98  --   --  91 86  BILITOT 0.9  --   --  1.0 0.8  ALBUMIN 4.1  --   --  3.8 3.4*  MG  --   --   --  2.2 1.9  CRP  --   --  1.0* 1.4* 2.3*  PROCALCITON  --   --  <0.10 <0.10 <0.10  LATICACIDVEN 0.8  --   --   --   --  INR  --   --  1.0  --   --   HGBA1C  --  8.1*  --   --    --   BNP  --   --   --  18.6 21.0    ------------------------------------------------------------------------------------------------------------------ No results for input(s): CHOL, HDL, LDLCALC, TRIG, CHOLHDL, LDLDIRECT in the last 72 hours.  Lab Results  Component Value Date   HGBA1C 8.1 (H) 12/29/2020   ------------------------------------------------------------------------------------------------------------------ No results for input(s): TSH, T4TOTAL, T3FREE, THYROIDAB in the last 72 hours.  Invalid input(s): FREET3  Cardiac Enzymes No results for input(s): CKMB, TROPONINI, MYOGLOBIN in the last 168 hours.  Invalid input(s): CK ------------------------------------------------------------------------------------------------------------------    Component Value Date/Time   BNP 21.0 12/31/2020 0416      Radiology Reports    MR FOOT RIGHT WO CONTRAST  Result Date: 12/29/2020 CLINICAL DATA:  Right fifth ray infection.  Recent from amputation. EXAM: MRI OF THE RIGHT FOREFOOT WITHOUT CONTRAST TECHNIQUE: Multiplanar, multisequence MR imaging of the right forefoot was performed. No intravenous contrast was administered. COMPARISON:  MRI right foot dated November 14, 2020. FINDINGS: Bones/Joint/Cartilage Prior amputation of the distal fifth metatarsal and base of the fifth proximal phalanx. Abnormal marrow edema with corresponding decreased T1 marrow signal involving the residual fifth metatarsal and fifth proximal phalanx shaft. No fracture or dislocation. Mild degenerative changes of the first MTP joint. No joint effusion. Ligaments Collateral ligaments are intact.  Lisfranc ligament is intact. Muscles and Tendons No tenosynovitis. Increased T2 signal within the intrinsic muscles of the forefoot, nonspecific, but likely related to diabetic muscle changes. Soft tissue 1.5 x 1.1 x 1.0 cm surrounding the fifth metatarsal stump with overlying skin ulceration. No soft tissue mass. IMPRESSION:  1. Osteomyelitis of the residual fifth metatarsal and fifth proximal phalanx shaft. 2. 1.5 cm soft tissue abscess surrounding the fifth metatarsal stump with overlying skin ulceration. Electronically Signed   By: Obie Dredge M.D.   On: 12/29/2020 05:26   DG Chest Port 1 View  Result Date: 12/29/2020 CLINICAL DATA:  Right foot osteomyelitis.  No chest complaints EXAM: PORTABLE CHEST 1 VIEW COMPARISON:  11/23/2014 FINDINGS: Normal heart size and mediastinal contours. No acute infiltrate or edema. No effusion or pneumothorax. No acute osseous findings. Diffuse thoracic endplate spurring. IMPRESSION: Negative low volume chest. Electronically Signed   By: Marnee Spring M.D.   On: 12/29/2020 07:37

## 2021-01-01 LAB — CBC WITH DIFFERENTIAL/PLATELET
Abs Immature Granulocytes: 0.02 10*3/uL (ref 0.00–0.07)
Basophils Absolute: 0.1 10*3/uL (ref 0.0–0.1)
Basophils Relative: 1 %
Eosinophils Absolute: 0.3 10*3/uL (ref 0.0–0.5)
Eosinophils Relative: 4 %
HCT: 37.1 % — ABNORMAL LOW (ref 39.0–52.0)
Hemoglobin: 12.8 g/dL — ABNORMAL LOW (ref 13.0–17.0)
Immature Granulocytes: 0 %
Lymphocytes Relative: 29 %
Lymphs Abs: 1.9 10*3/uL (ref 0.7–4.0)
MCH: 32.6 pg (ref 26.0–34.0)
MCHC: 34.5 g/dL (ref 30.0–36.0)
MCV: 94.4 fL (ref 80.0–100.0)
Monocytes Absolute: 0.7 10*3/uL (ref 0.1–1.0)
Monocytes Relative: 10 %
Neutro Abs: 3.7 10*3/uL (ref 1.7–7.7)
Neutrophils Relative %: 56 %
Platelets: 189 10*3/uL (ref 150–400)
RBC: 3.93 MIL/uL — ABNORMAL LOW (ref 4.22–5.81)
RDW: 12.6 % (ref 11.5–15.5)
WBC: 6.6 10*3/uL (ref 4.0–10.5)
nRBC: 0 % (ref 0.0–0.2)

## 2021-01-01 LAB — COMPREHENSIVE METABOLIC PANEL
ALT: 22 U/L (ref 0–44)
AST: 17 U/L (ref 15–41)
Albumin: 3.5 g/dL (ref 3.5–5.0)
Alkaline Phosphatase: 81 U/L (ref 38–126)
Anion gap: 6 (ref 5–15)
BUN: 7 mg/dL (ref 6–20)
CO2: 26 mmol/L (ref 22–32)
Calcium: 8.8 mg/dL — ABNORMAL LOW (ref 8.9–10.3)
Chloride: 108 mmol/L (ref 98–111)
Creatinine, Ser: 0.57 mg/dL — ABNORMAL LOW (ref 0.61–1.24)
GFR, Estimated: >60 mL/min (ref >60)
Glucose, Bld: 102 mg/dL — ABNORMAL HIGH (ref 70–99)
Potassium: 3.4 mmol/L — ABNORMAL LOW (ref 3.5–5.1)
Sodium: 140 mmol/L (ref 135–145)
Total Bilirubin: 0.9 mg/dL (ref 0.3–1.2)
Total Protein: 6.7 g/dL (ref 6.5–8.1)

## 2021-01-01 LAB — GLUCOSE, CAPILLARY
Glucose-Capillary: 123 mg/dL — ABNORMAL HIGH (ref 70–99)
Glucose-Capillary: 106 mg/dL — ABNORMAL HIGH (ref 70–99)
Glucose-Capillary: 159 mg/dL — ABNORMAL HIGH (ref 70–99)
Glucose-Capillary: 96 mg/dL (ref 70–99)

## 2021-01-01 LAB — C-REACTIVE PROTEIN: CRP: 3.7 mg/dL — ABNORMAL HIGH (ref <1.0)

## 2021-01-01 LAB — BRAIN NATRIURETIC PEPTIDE: B Natriuretic Peptide: 28.9 pg/mL (ref 0.0–100.0)

## 2021-01-01 LAB — PROCALCITONIN: Procalcitonin: <0.1 ng/mL

## 2021-01-01 LAB — MAGNESIUM: Magnesium: 1.9 mg/dL (ref 1.7–2.4)

## 2021-01-01 MED ORDER — CHLORHEXIDINE GLUCONATE CLOTH 2 % EX PADS
6.0000 | MEDICATED_PAD | Freq: Every day | CUTANEOUS | Status: DC
  Administered 2021-01-01 – 2021-01-03 (×3): 6 via TOPICAL

## 2021-01-01 MED ORDER — DOCUSATE SODIUM 100 MG PO CAPS
200.0000 mg | ORAL_CAPSULE | Freq: Two times a day (BID) | ORAL | Status: DC
  Administered 2021-01-01 – 2021-01-03 (×5): 200 mg via ORAL
  Filled 2021-01-01 (×5): qty 2

## 2021-01-01 MED ORDER — SODIUM CHLORIDE 0.9% FLUSH
10.0000 mL | Freq: Two times a day (BID) | INTRAVENOUS | Status: DC
  Administered 2021-01-01 – 2021-01-02 (×3): 10 mL

## 2021-01-01 MED ORDER — POTASSIUM CHLORIDE CRYS ER 20 MEQ PO TBCR
40.0000 meq | EXTENDED_RELEASE_TABLET | Freq: Once | ORAL | Status: AC
  Administered 2021-01-01: 40 meq via ORAL
  Filled 2021-01-01: qty 2

## 2021-01-01 MED ORDER — POLYETHYLENE GLYCOL 3350 17 G PO PACK
17.0000 g | PACK | Freq: Two times a day (BID) | ORAL | Status: DC
  Administered 2021-01-01 – 2021-01-03 (×5): 17 g via ORAL
  Filled 2021-01-01 (×5): qty 1

## 2021-01-01 MED ORDER — SODIUM CHLORIDE 0.9% FLUSH: 10.0000 mL | INTRAVENOUS | Status: DC | PRN

## 2021-01-01 MED ORDER — PIPERACILLIN-TAZOBACTAM 3.375 G IVPB
3.3750 g | Freq: Three times a day (TID) | INTRAVENOUS | Status: DC
  Administered 2021-01-01 – 2021-01-03 (×6): 3.375 g via INTRAVENOUS
  Filled 2021-01-01 (×6): qty 50

## 2021-01-01 NOTE — Consult Note (Signed)
Pharmacy Antibiotic Note  Brendan Pruitt is a 45 y.o. male with medical history including tobacco use, diabetes c/b neuropathy, history of DFIs, history of amputations, alcohol use, HTN, HLD admitted on 12/29/2020 with  right fifth metatarsal wound infection / concern for OM . He had right partial 5th ray amputation. Culture growing pseudomonas. Pharmacy has been consulted for Zosyn dosing.  Plan: Zosyn 3.375 g IV q8h (extended infusion)   Height: 5\' 5"  (165.1 cm) Weight: 90.7 kg (200 lb) IBW/kg (Calculated) : 61.5  Temp (24hrs), Avg:97.8 F (36.6 C), Min:97.4 F (36.3 C), Max:98.4 F (36.9 C)  Recent Labs  Lab 12/28/20 1944 12/29/20 0656 12/30/20 0307 12/31/20 0416 01/01/21 0406  WBC 10.2 6.1 6.6 8.8 6.6  CREATININE 0.80 0.62 0.81 0.76 0.57*  LATICACIDVEN 0.8  --   --   --   --      Estimated Creatinine Clearance: 123.3 mL/min (A) (by C-G formula based on SCr of 0.57 mg/dL (L)).    Allergies  Allergen Reactions   Other Itching    Brown Paper towels    Antimicrobials this admission: Vancomycin 8/18 >> 8/21 Zosyn 8/18 >> 8/20, 8/21 >> Cefepime 8/20 >> 8/21 Flagyl 8/20 >> 8/21   Microbiology results: 8/19 Wound (right foot): pseudomonas 8/19 Bone (right 5th metatarsal): NG pending  Thank you for allowing pharmacy to be a part of this patient's care.  9/19, PharmD, BCPS Clinical Pharmacist 01/01/2021 10:47 AM

## 2021-01-01 NOTE — Progress Notes (Signed)
Physical Therapy Treatment Patient Details Name: Brendan Pruitt MRN: [id number] DOB: 01/12/1976 Today's Date: 01/01/2021    History of Present Illness Pt is a 45 y/o M with PMH: HTN, HLD, DM, L great toe amputation, and recent hospitalization 7/4-7/8 for sepsis 2/2 right foot ulcer and fifth metatarsal osteomyelitis undergoing I&D with excision of fifth metatarsal head. Pt presents this admission from podiatry office d/t concern for R foot osteomylitis. Confirmed on MRI. Pt underwent R partial 5th ray amputation, application of antibiotic beads, & rotational skin flap closure on 12/30/20 by Dr. Excell Seltzer.    PT Comments    Feeling good.  Self initiates donning post op boot and shoes for gait.  He stands without assist and is able to walk with walker with good adherance to PWB/heel with extra use of arms to reduce WB.  He does self elect to walk a bit further day stating stiffness/soreness from immobility.  Education provided.  Pt will continue to benefit from a wheelchair upon discharge to limit WB.     Patient suffers from  toe amputation with limited WB/gait for healing which impairs his/her ability to perform daily activities like toileting, feeding, dressing, grooming, bathing in the home. A cane, walker, crutch will not resolve the patient's issue with performing activities of daily living. A lightweight wheelchair and cushion is required/recommended and will allow patient to safely perform daily activities.   Patient can safely propel the wheelchair in the home or has a caregiver who can provide assistance.    Follow Up Recommendations  Home health PT;Supervision for mobility/OOB     Equipment Recommendations  Wheelchair (measurements PT);Wheelchair cushion (measurements PT);Rolling walker with 5" wheels;Other (comment)    Recommendations for Other Services       Precautions / Restrictions Precautions Precautions: Fall Restrictions Other Position/Activity Restrictions: heel wt  bearing with gait distances limited. Recommended pt use RW at all times. off loading shoe in room    Mobility  Bed Mobility Overal bed mobility: Independent                  Transfers Overall transfer level: Modified independent Equipment used: Rolling walker (2 wheeled) Transfers: Sit to/from Stand Sit to Stand: Modified independent (Device/Increase time)            Ambulation/Gait Ambulation/Gait assistance: Modified independent (Device/Increase time) Gait Distance (Feet): 100 Feet Assistive device: Rolling walker (2 wheeled) Gait Pattern/deviations: Decreased step length - left;Decreased step length - right;Decreased stride length Gait velocity: decreased   General Gait Details: steady safe gait with good maintiaing of PWB/heel with post op boot.  Pt elects to walk a bit further today.   Stairs             Wheelchair Mobility    Modified Rankin (Stroke Patients Only)       Balance Overall balance assessment: Needs assistance;Modified Independent Sitting-balance support: Feet supported Sitting balance-Leahy Scale: Normal     Standing balance support: Bilateral upper extremity supported;During functional activity Standing balance-Leahy Scale: Good                              Cognition Arousal/Alertness: Awake/alert Behavior During Therapy: WFL for tasks assessed/performed Overall Cognitive Status: Within Functional Limits for tasks assessed  Exercises      General Comments        Pertinent Vitals/Pain Pain Assessment: Faces Faces Pain Scale: Hurts a little bit Pain Location: R foot Pain Descriptors / Indicators: Sore Pain Intervention(s): Limited activity within patient's tolerance;Monitored during session;Repositioned    Home Living                      Prior Function            PT Goals (current goals can now be found in the care plan section) Progress  towards PT goals: Progressing toward goals    Frequency    7X/week      PT Plan Current plan remains appropriate    Co-evaluation              AM-PAC PT "6 Clicks" Mobility   Outcome Measure  Help needed turning from your back to your side while in a flat bed without using bedrails?: None Help needed moving from lying on your back to sitting on the side of a flat bed without using bedrails?: None Help needed moving to and from a bed to a chair (including a wheelchair)?: None Help needed standing up from a chair using your arms (e.g., wheelchair or bedside chair)?: None Help needed to walk in hospital room?: None Help needed climbing 3-5 steps with a railing? : A Little 6 Click Score: 23    End of Session Equipment Utilized During Treatment: Gait belt Activity Tolerance: Patient tolerated treatment well Patient left: in chair;with call bell/phone within reach;with chair alarm set;with nursing/sitter in room Nurse Communication: Mobility status PT Visit Diagnosis: Other abnormalities of gait and mobility (R26.89);Muscle weakness (generalized) (M62.81)     Time: 4010-2725 PT Time Calculation (min) (ACUTE ONLY): 13 min  Charges:  $Gait Training: 8-22 mins                    Danielle Dess, PTA 01/01/21, 9:28 AM , 9:25 AM

## 2021-01-01 NOTE — Progress Notes (Signed)
Left upper with rash prior to PICC insertion that became more reddened and raised after second CHG scrub at end of procedure.  Patient denies pain or itching.  No open areas noted.

## 2021-01-01 NOTE — Progress Notes (Signed)
Peripherally Inserted Central Catheter Placement  The IV Nurse has discussed with the patient and/or persons authorized to consent for the patient, the purpose of this procedure and the potential benefits and risks involved with this procedure.  The benefits include less needle sticks, lab draws from the catheter, and the patient may be discharged home with the catheter. Risks include, but not limited to, infection, bleeding, blood clot (thrombus formation), and puncture of an artery; nerve damage and irregular heartbeat and possibility to perform a PICC exchange if needed/ordered by physician.  Alternatives to this procedure were also discussed.  Bard Power PICC patient education guide, fact sheet on infection prevention and patient information card has been provided to patient /or left at bedside.    PICC Placement Documentation  PICC Single Lumen 01/01/21 Left Basilic 40 cm 0 cm (Active)  Indication for Insertion or Continuance of Line Home intravenous therapies (PICC only) 01/01/21 1624  Exposed Catheter (cm) 0 cm 01/01/21 1624  Site Assessment Clean;Dry;Intact 01/01/21 1624  Line Status Flushed;Saline locked;Blood return noted 01/01/21 1624  Dressing Type Transparent 01/01/21 1624  Dressing Status Clean;Dry;Intact 01/01/21 1624  Antimicrobial disc in place? Yes 01/01/21 1624  Safety Lock Not Applicable 01/01/21 1624  Line Care Connections checked and tightened 01/01/21 1624  Line Adjustment (NICU/IV Team Only) No 01/01/21 1624  Dressing Intervention New dressing 01/01/21 1624  Dressing Change Due 01/08/21 01/01/21 1624       Laiden Milles, Lajean Manes 01/01/2021, 4:26 PM

## 2021-01-01 NOTE — Progress Notes (Signed)
PROGRESS NOTE                                                                                                                                                                                                             Patient Demographics:    Brendan Pruitt, is a 45 y.o. male, DOB - 02-26-1976, ZOX:[id number]  Outpatient Primary MD for the patient is Barbette ReichmannHande, Vishwanath, MD    LOS - 3  Admit date - 12/29/2020    Chief Complaint  Patient presents with   Wound Infection    R 5th metatarsal       Brief Narrative (HPI from H&P)  Brendan Pruitt is a 45 y.o. male with medical history significant for hypertension, hyperlipidemia, diabetes mellitus, s/p left great toe amputation,  who was sent in by his podiatrist due to concerns for osteomyelitis, work-up suggestive of right foot abscess and osteomyelitis and he was admitted to the hospital.   Subjective:   Patient in bed, appears comfortable, denies any headache, no fever, no chest pain or pressure, no shortness of breath , no abdominal pain. No new focal weakness.   Assessment  & Plan :     Right foot fifth metatarsal osteomyelitis with right foot abscess present on admission in a patient with history of diabetic foot infection - he has been appropriately placed on IV antibiotics empirically, ABIs were inconclusive but had good waveforms, MRSA nasal PCR negative, procalcitonin undetectable, operative cultures growing Pseudomonas also he is developing itchy macular rash likely from Rocephin, plan discussed with Dr. Renold DonVu ID on-call on 01/01/2021 he will be switched to Zosyn on 01/01/2021. PICC line to be inserted as patient likely to go home on prolonged IV antibiotics.  2.  Nonspecific EKG changes.  Similar to EKG from July 2016, chest pain-free, troponin negative x2, Echo stable EF 60% no WMA.Marland Kitchen.  3. Obesity BMI 33, follow with PCP.  4.  Hypertension.  Blood pressure is soft hold  off all blood pressure medications for now and monitor.  5.  Drug eruption.  Likely from Rocephin, see #1 above.    6. DM type II.  On sliding scale, will add Lantus.  Will monitor and adjust.  Holding home Meds Glucotrol and Glucophage along with [place]Farxiga.    Lab Results  Component Value Date   HGBA1C 8.1 (H) 12/29/2020  CBG (last 3)  Recent Labs    12/31/20 1631 12/31/20 2117 01/01/21 0818  GLUCAP 184* 114* 123*        Condition - Fair  Family Communication  :  None Present  Code Status :  Full  Consults  :  Podiatry  PUD Prophylaxis :    Procedures  :     TTE - EF 60%, no wall motion abnormality, chronic grade 1 diastolic dysfunction.    Right foot surgery by Dr. Rosetta Posner on 12/30/2020.  Right partial fifth ray amputation Rotational skin flap closure Application of antibiotic beads  ABI - good waveforms but inconclusive.  MRI R foot -  1. Osteomyelitis of the residual fifth metatarsal and fifth proximal phalanx shaft. 2. 1.5 cm soft tissue abscess surrounding the fifth metatarsal stump with overlying skin ulceration.      Disposition Plan  :    Status is: Inpatient  Remains inpatient appropriate because:IV treatments appropriate due to intensity of illness or inability to take PO  Dispo: The patient is from: Home              Anticipated d/c is to: Home              Patient currently is not medically stable to d/c.   Difficult to place patient No   DVT Prophylaxis  :  Lovenox    Lab Results  Component Value Date   PLT 189 01/01/2021    Diet :  Diet Order             Diet heart healthy/carb modified Room service appropriate? Yes; Fluid consistency: Thin  Diet effective now                    Inpatient Medications  Scheduled Meds:  docusate sodium  200 mg Oral BID   enoxaparin (LOVENOX) injection  0.5 mg/kg Subcutaneous Q24H   insulin aspart  0-15 Units Subcutaneous TID WC   insulin aspart  0-5 Units Subcutaneous QHS    insulin glargine-yfgn  20 Units Subcutaneous Daily   levothyroxine  25 mcg Oral Daily   metroNIDAZOLE  500 mg Oral Q8H   mupirocin ointment  1 application Nasal BID   polyethylene glycol  17 g Oral BID   Continuous Infusions:  sodium chloride Stopped (12/31/20 0935)   ceFEPime (MAXIPIME) IV 2 g (01/01/21 0557)   vancomycin 1,000 mg (01/01/21 4970)   PRN Meds:.sodium chloride, acetaminophen **OR** [DISCONTINUED] acetaminophen, diphenhydrAMINE, HYDROcodone-acetaminophen, [DISCONTINUED] ondansetron **OR** ondansetron (ZOFRAN) IV  Antibiotics  :    Anti-infectives (From admission, onward)    Start     Dose/Rate Route Frequency Ordered Stop   12/31/20 1600  ceFEPIme (MAXIPIME) 2 g in sodium chloride 0.9 % 100 mL IVPB        2 g 200 mL/hr over 30 Minutes Intravenous Every 8 hours 12/31/20 1503     12/31/20 0800  cefTRIAXone (ROCEPHIN) 2 g in sodium chloride 0.9 % 100 mL IVPB  Status:  Discontinued        2 g 200 mL/hr over 30 Minutes Intravenous Every 24 hours 12/30/20 2133 12/31/20 1503   12/31/20 0600  metroNIDAZOLE (FLAGYL) tablet 500 mg        500 mg Oral Every 8 hours 12/30/20 2133     12/30/20 0842  vancomycin (VANCOCIN) powder  Status:  Discontinued          As needed 12/30/20 0843 12/30/20 0844   12/29/20 0600  vancomycin (VANCOCIN)  IVPB 1000 mg/200 mL premix        1,000 mg 200 mL/hr over 60 Minutes Intravenous Every 12 hours 12/29/20 0426     12/29/20 0600  piperacillin-tazobactam (ZOSYN) IVPB 3.375 g        3.375 g 12.5 mL/hr over 240 Minutes Intravenous Every 8 hours 12/29/20 0427 12/31/20 0202   12/29/20 0130  piperacillin-tazobactam (ZOSYN) IVPB 3.375 g        3.375 g 100 mL/hr over 30 Minutes Intravenous  Once 12/29/20 0121 12/29/20 0319   12/29/20 0130  vancomycin (VANCOCIN) IVPB 1000 mg/200 mL premix        1,000 mg 200 mL/hr over 60 Minutes Intravenous  Once 12/29/20 0124 12/29/20 0428        Time Spent in minutes  30   Susa Raring M.D on 01/01/2021 at  10:26 AM  To page go to [email or web address]   [facility] -  Office  [phone]     See all Orders from today for further details    Objective:   Vitals:   12/31/20 1531 12/31/20 1949 01/01/21 0448 01/01/21 0816  BP: 114/90 (!) 150/97 108/82 (!) 129/91  Pulse: 89 83 85 85  Resp: 18 16 16 18   Temp: 97.6 F (36.4 C) (!) 97.4 F (36.3 C) 97.7 F (36.5 C) 98.4 F (36.9 C)  TempSrc:      SpO2: 100% 100% 100% 99%  Weight:      Height:        Wt Readings from Last 3 Encounters:  12/28/20 90.7 kg  12/13/20 93.9 kg  11/29/20 93.9 kg     Intake/Output Summary (Last 24 hours) at 01/01/2021 1026 Last data filed at 01/01/2021 0325 Gross per 24 hour  Intake 881.77 ml  Output --  Net 881.77 ml     Physical Exam  Awake Alert, No new F.N deficits, Normal affect [place].AT,PERRAL Supple Neck,No JVD, No cervical lymphadenopathy appriciated.  Symmetrical Chest wall movement, Good air movement bilaterally, CTAB RRR,No Gallops, Rubs or new Murmurs, No Parasternal Heave +ve B.Sounds, Abd Soft, No tenderness, No organomegaly appriciated, No rebound - guarding or rigidity.  He has developed some itchy macular rash in his right leg which looks like a drug eruption Left big toe amputation, right foot postop under bandage with right big toe amputated      Data Review:    CBC Recent Labs  Lab 12/28/20 1944 12/29/20 0656 12/30/20 0307 12/31/20 0416 01/01/21 0406  WBC 10.2 6.1 6.6 8.8 6.6  HGB 13.4 13.5 14.1 13.4 12.8*  HCT 38.4* 38.6* 40.5 38.0* 37.1*  PLT 232 216 216 197 189  MCV 94.3 94.4 92.7 95.0 94.4  MCH 32.9 33.0 32.3 33.5 32.6  MCHC 34.9 35.0 34.8 35.3 34.5  RDW 12.7 12.6 12.8 12.7 12.6  LYMPHSABS 2.9  --  1.0 1.6 1.9  MONOABS 0.7  --  0.8 1.0 0.7  EOSABS 0.2  --  0.3 0.3 0.3  BASOSABS 0.0  --  0.1 0.1 0.1    Recent Labs  Lab 12/28/20 1944 12/29/20 0656 12/29/20 1234 12/30/20 0307 12/31/20 0416 01/01/21 0406  NA 134*  --   --  138 139 140  K 4.0  --    --  3.6 3.5 3.4*  CL 100  --   --  105 107 108  CO2 25  --   --  26 24 26   GLUCOSE 151*  --   --  84 93 102*  BUN 10  --   --  11 10 7   CREATININE 0.80 0.62  --  0.81 0.76 0.57*  CALCIUM 9.1  --   --  8.9 8.8* 8.8*  AST 32  --   --  22 18 17   ALT 37  --   --  31 27 22   ALKPHOS 98  --   --  91 86 81  BILITOT 0.9  --   --  1.0 0.8 0.9  ALBUMIN 4.1  --   --  3.8 3.4* 3.5  MG  --   --   --  2.2 1.9 1.9  CRP  --   --  1.0* 1.4* 2.3* 3.7*  PROCALCITON  --   --  <0.10 <0.10 <0.10 <0.10  LATICACIDVEN 0.8  --   --   --   --   --   INR  --   --  1.0  --   --   --   HGBA1C  --  8.1*  --   --   --   --   BNP  --   --   --  18.6 21.0 28.9    ------------------------------------------------------------------------------------------------------------------ No results for input(s): CHOL, HDL, LDLCALC, TRIG, CHOLHDL, LDLDIRECT in the last 72 hours.  Lab Results  Component Value Date   HGBA1C 8.1 (H) 12/29/2020   ------------------------------------------------------------------------------------------------------------------ No results for input(s): TSH, T4TOTAL, T3FREE, THYROIDAB in the last 72 hours.  Invalid input(s): FREET3  Cardiac Enzymes No results for input(s): CKMB, TROPONINI, MYOGLOBIN in the last 168 hours.  Invalid input(s): CK ------------------------------------------------------------------------------------------------------------------    Component Value Date/Time   BNP 28.9 01/01/2021 0406      Radiology Reports    MR FOOT RIGHT WO CONTRAST  Result Date: 12/29/2020 CLINICAL DATA:  Right fifth ray infection.  Recent from amputation. EXAM: MRI OF THE RIGHT FOREFOOT WITHOUT CONTRAST TECHNIQUE: Multiplanar, multisequence MR imaging of the right forefoot was performed. No intravenous contrast was administered. COMPARISON:  MRI right foot dated November 14, 2020. FINDINGS: Bones/Joint/Cartilage Prior amputation of the distal fifth metatarsal and base of the fifth proximal  phalanx. Abnormal marrow edema with corresponding decreased T1 marrow signal involving the residual fifth metatarsal and fifth proximal phalanx shaft. No fracture or dislocation. Mild degenerative changes of the first MTP joint. No joint effusion. Ligaments Collateral ligaments are intact.  Lisfranc ligament is intact. Muscles and Tendons No tenosynovitis. Increased T2 signal within the intrinsic muscles of the forefoot, nonspecific, but likely related to diabetic muscle changes. Soft tissue 1.5 x 1.1 x 1.0 cm surrounding the fifth metatarsal stump with overlying skin ulceration. No soft tissue mass. IMPRESSION: 1. Osteomyelitis of the residual fifth metatarsal and fifth proximal phalanx shaft. 2. 1.5 cm soft tissue abscess surrounding the fifth metatarsal stump with overlying skin ulceration. Electronically Signed   By: 01/03/2021 M.D.   On: 12/29/2020 05:26   DG Chest Port 1 View  Result Date: 12/29/2020 CLINICAL DATA:  Right foot osteomyelitis.  No chest complaints EXAM: PORTABLE CHEST 1 VIEW COMPARISON:  11/23/2014 FINDINGS: Normal heart size and mediastinal contours. No acute infiltrate or edema. No effusion or pneumothorax. No acute osseous findings. Diffuse thoracic endplate spurring. IMPRESSION: Negative low volume chest. Electronically Signed   By: 12/31/2020 M.D.   On: 12/29/2020 07:37

## 2021-01-02 LAB — COMPREHENSIVE METABOLIC PANEL
ALT: 26 U/L (ref 0–44)
AST: 24 U/L (ref 15–41)
Albumin: 3.6 g/dL (ref 3.5–5.0)
Alkaline Phosphatase: 80 U/L (ref 38–126)
Anion gap: 5 (ref 5–15)
BUN: 9 mg/dL (ref 6–20)
CO2: 27 mmol/L (ref 22–32)
Calcium: 9.2 mg/dL (ref 8.9–10.3)
Chloride: 106 mmol/L (ref 98–111)
Creatinine, Ser: 0.68 mg/dL (ref 0.61–1.24)
GFR, Estimated: >60 mL/min (ref >60)
Glucose, Bld: 93 mg/dL (ref 70–99)
Potassium: 3.7 mmol/L (ref 3.5–5.1)
Sodium: 138 mmol/L (ref 135–145)
Total Bilirubin: 0.6 mg/dL (ref 0.3–1.2)
Total Protein: 7.1 g/dL (ref 6.5–8.1)

## 2021-01-02 LAB — GLUCOSE, CAPILLARY
Glucose-Capillary: 146 mg/dL — ABNORMAL HIGH (ref 70–99)
Glucose-Capillary: 87 mg/dL (ref 70–99)
Glucose-Capillary: 138 mg/dL — ABNORMAL HIGH (ref 70–99)
Glucose-Capillary: 137 mg/dL — ABNORMAL HIGH (ref 70–99)

## 2021-01-02 LAB — MAGNESIUM: Magnesium: 2 mg/dL (ref 1.7–2.4)

## 2021-01-02 LAB — CBC WITH DIFFERENTIAL/PLATELET
Abs Immature Granulocytes: 0.01 10*3/uL (ref 0.00–0.07)
Basophils Absolute: 0.1 10*3/uL (ref 0.0–0.1)
Basophils Relative: 1 %
Eosinophils Absolute: 0.3 10*3/uL (ref 0.0–0.5)
Eosinophils Relative: 5 %
HCT: 39.1 % (ref 39.0–52.0)
Hemoglobin: 13.4 g/dL (ref 13.0–17.0)
Immature Granulocytes: 0 %
Lymphocytes Relative: 24 %
Lymphs Abs: 1.4 10*3/uL (ref 0.7–4.0)
MCH: 32.2 pg (ref 26.0–34.0)
MCHC: 34.3 g/dL (ref 30.0–36.0)
MCV: 94 fL (ref 80.0–100.0)
Monocytes Absolute: 0.6 10*3/uL (ref 0.1–1.0)
Monocytes Relative: 10 %
Neutro Abs: 3.4 10*3/uL (ref 1.7–7.7)
Neutrophils Relative %: 60 %
Platelets: 199 10*3/uL (ref 150–400)
RBC: 4.16 MIL/uL — ABNORMAL LOW (ref 4.22–5.81)
RDW: 12.6 % (ref 11.5–15.5)
WBC: 5.6 10*3/uL (ref 4.0–10.5)
nRBC: 0 % (ref 0.0–0.2)

## 2021-01-02 LAB — PROCALCITONIN: Procalcitonin: <0.1 ng/mL

## 2021-01-02 LAB — C-REACTIVE PROTEIN: CRP: 3.5 mg/dL — ABNORMAL HIGH (ref <1.0)

## 2021-01-02 LAB — SURGICAL PATHOLOGY

## 2021-01-02 LAB — BRAIN NATRIURETIC PEPTIDE: B Natriuretic Peptide: 51.8 pg/mL (ref 0.0–100.0)

## 2021-01-02 MED ORDER — AMLODIPINE BESYLATE 10 MG PO TABS
10.0000 mg | ORAL_TABLET | Freq: Every day | ORAL | Status: DC
  Administered 2021-01-02 – 2021-01-03 (×2): 10 mg via ORAL
  Filled 2021-01-02 (×2): qty 1

## 2021-01-02 NOTE — Progress Notes (Signed)
Occupational Therapy Treatment Patient Details Name: Brendan Pruitt MRN: [id number] DOB: [birth year] Today's Date: 01/02/2021    History of present illness Pt is a 45 y/o M with PMH: HTN, HLD, DM, L great toe amputation, and recent hospitalization 7/4-7/8 for sepsis 2/2 right foot ulcer and fifth metatarsal osteomyelitis undergoing I&D with excision of fifth metatarsal head. Pt presents this admission from podiatry office d/t concern for R foot osteomylitis. Confirmed on MRI. Pt underwent R partial 5th ray amputation, application of antibiotic beads, & rotational skin flap closure on 12/30/20 by Dr. Excell Seltzer.   OT comments  Pt seen for OT treatment on this date. Upon arrival to room, pt seated upright in recliner and motivated to participate in OT tx. This date, pt was able to don post-op boot independently while seated in recliner. With safe hand placement, pt was able to perform sit>stand transfer MOD-I, perform functional mobility of household distances with RW MOD-I, and perform standing grooming tasks MOD-I. Pt educated on activity pacing and the importance of refraining from driving (and subsequently using RLE) until fully healed and cleared by MD, pt verbalized understanding. Pt is making good progress toward goals and discharge recommendation changed to reflect progress. Plan to assess acute rehab OT goals in subsequent session.    Follow Up Recommendations  No OT follow up;Supervision - Intermittent    Equipment Recommendations  Tub/shower seat;3 in 1 bedside commode       Precautions / Restrictions Precautions Precautions: Fall Restrictions Weight Bearing Restrictions: Yes RLE Weight Bearing: Partial weight bearing Other Position/Activity Restrictions: heel wt bearing with gait distances limited. Recommended pt use RW at all times. off loading shoe in room       Mobility Bed Mobility               General bed mobility comments: Pt. up in chair upon arrival     Transfers Overall transfer level: Modified independent Equipment used: Rolling walker (2 wheeled) Transfers: Sit to/from Stand Sit to Stand: Modified independent (Device/Increase time)         General transfer comment: no verbal cues or physical assistance required to stand from recliner    Balance Overall balance assessment: Needs assistance;Modified Independent Sitting-balance support: No upper extremity supported;Feet supported Sitting balance-Leahy Scale: Normal     Standing balance support: Bilateral upper extremity supported;During functional activity Standing balance-Leahy Scale: Good Standing balance comment: good balance with UE support from RW to maintain PWB precaution                           ADL either performed or assessed with clinical judgement   ADL Overall ADL's : Modified independent     Grooming: Wash/dry hands;Modified independent;Standing Grooming Details (indicate cue type and reason): while standing at sink, pt able to engage b/l UE to wash hands and reach outside BOS to obtain paper towel and dry hands             Lower Body Dressing: Modified independent;Sitting/lateral leans Lower Body Dressing Details (indicate cue type and reason): to don surgical boot while sitting in recliner             Functional mobility during ADLs: Modified independent;Rolling walker        Cognition Arousal/Alertness: Awake/alert Behavior During Therapy: WFL for tasks assessed/performed Overall Cognitive Status: Within Functional Limits for tasks assessed  General Comments: Pt is A and O x 4 and extremely motivated        Exercises Other Exercises Other Exercises: Education on activity pacing and importance of refraining from using RLE when driving until fully healed and cleared by MD           Pertinent Vitals/ Pain       Pain Assessment: 0-10 Pain Score: 10-Worst pain ever Faces Pain  Scale: Hurts a little bit Pain Location: R foot Pain Descriptors / Indicators: Sore Pain Intervention(s): Limited activity within patient's tolerance;Monitored during session;Repositioned      Progress Toward Goals  OT Goals(current goals can now be found in the care plan section)  Progress towards OT goals: Progressing toward goals  Acute Rehab OT Goals Patient Stated Goal: get better, go home OT Goal Formulation: With patient Time For Goal Achievement: 01/12/21 Potential to Achieve Goals: Good  Plan Frequency remains appropriate;Discharge plan needs to be updated       AM-PAC OT "6 Clicks" Daily Activity     Outcome Measure   Help from another person eating meals?: None Help from another person taking care of personal grooming?: None Help from another person toileting, which includes using toliet, bedpan, or urinal?: None Help from another person bathing (including washing, rinsing, drying)?: A Little Help from another person to put on and taking off regular upper body clothing?: None Help from another person to put on and taking off regular lower body clothing?: A Little 6 Click Score: 22    End of Session Equipment Utilized During Treatment: Gait belt;Rolling walker  OT Visit Diagnosis: Unsteadiness on feet (R26.81);Muscle weakness (generalized) (M62.81)   Activity Tolerance Patient tolerated treatment well   Patient Left in chair;with call bell/phone within reach   Nurse Communication Mobility status        Time: 7893-8101 OT Time Calculation (min): 14 min  Charges: OT General Charges $OT Visit: 1 Visit OT Treatments $Self Care/Home Management : 8-22 mins  Matthew Folks, OTR/L [facility] [phone]

## 2021-01-02 NOTE — Progress Notes (Signed)
Date of Admission:  12/29/2020      ID: Brendan Pruitt is a 45 y.o. male  Active Problems:   History of amputation of left great toe (HCC)   Diabetes mellitus, type II (HCC)   Diabetic ulcer of right foot associated with type 2 diabetes mellitus, with fat layer exposed (HCC)   HTN (hypertension)   Diabetic neuropathy (HCC)   Abscess of right foot   Acute osteomyelitis of metatarsal bone of right foot (HCC)    Subjective: Feeling better Pain less No fever  No diarrhea Says he has some itching back and leg  Medications:   amLODipine  10 mg Oral Daily   Chlorhexidine Gluconate Cloth  6 each Topical Daily   docusate sodium  200 mg Oral BID   enoxaparin (LOVENOX) injection  0.5 mg/kg Subcutaneous Q24H   insulin aspart  0-15 Units Subcutaneous TID WC   insulin aspart  0-5 Units Subcutaneous QHS   insulin glargine-yfgn  20 Units Subcutaneous Daily   levothyroxine  25 mcg Oral Daily   mupirocin ointment  1 application Nasal BID   polyethylene glycol  17 g Oral BID   sodium chloride flush  10-40 mL Intracatheter Q12H    Objective: Vital signs in last 24 hours: Temp:  [98 F (36.7 C)-98.6 F (37 C)] 98.1 F (36.7 C) (08/22 0913) Pulse Rate:  [80-93] 93 (08/22 0913) Resp:  [17-18] 17 (08/22 0913) BP: (102-140)/(68-94) 102/74 (08/22 0913) SpO2:  [97 %-100 %] 100 % (08/22 0913)  PHYSICAL EXAM:  General: Alert, cooperative, no distress, appears stated age.  Head: Normocephalic, without obvious abnormality, atraumatic. Eyes: Conjunctivae clear, anicteric sclerae. Pupils are equal ENT Nares normal. No drainage or sinus tenderness. Lips, mucosa, and tongue normal. No Thrush Neck: Supple, symmetrical, no adenopathy, thyroid: non tender no carotid bruit and no JVD. Back: No CVA tenderness. Lungs: Clear to auscultation bilaterally. No Wheezing or Rhonchi. No rales. Heart: Regular rate and rhythm, no murmur, rub or gallop. Abdomen: Soft, non-tender,not distended. Bowel sounds  normal. No masses Extremities: rt foot       Skin: No rashes or lesions. Or bruising Lymph: Cervical, supraclavicular normal. Neurologic: Grossly non-focal  Lab Results Recent Labs    01/01/21 0406 01/02/21 0442  WBC 6.6 5.6  HGB 12.8* 13.4  HCT 37.1* 39.1  NA 140 138  K 3.4* 3.7  CL 108 106  CO2 26 27  BUN 7 9  CREATININE 0.57* 0.68   Liver Panel Recent Labs    01/01/21 0406 01/02/21 0442  PROT 6.7 7.1  ALBUMIN 3.5 3.6  AST 17 24  ALT 22 26  ALKPHOS 81 80  BILITOT 0.9 0.6   Sedimentation Rate No results for input(s): ESRSEDRATE in the last 72 hours. C-Reactive Protein Recent Labs    01/01/21 0406 01/02/21 0442  CRP 3.7* 3.5*    Microbiology: Surgical culture -pseudomonas Studies/Results: [place] EKG SITE RITE  Result Date: 12/31/2020 If Site Rite image not attached, placement could not be confirmed due to current cardiac rhythm.    Assessment/Plan:  Diabetic foot infection.  Osteomyelitis of the fifth toe leading to metatarsal head amputation on 11/14/2020.  As there was residual osteomyelitis he was treated with 3 weeks of IV Zosyn  followed by p.o. Augmentin.  During that hospitalization culture was positive for group B streptococcus.  Patient was admitted with residual osteomyelitis and infection and underwent fifth ray excision on 12/30/2020.  Culture Pseudomonas.  Patient currently on Zosyn.  As it is a  pansensitive Pseudomonas he can be discharged home on a p.o. antibiotic which is ciprofloxacin plus Flagyl.  He will need for 4 weeks.  Diabetes mellitus on insulin  Hypothyroidism on Synthroid  Discussed the management with the patient and care team. Will follow him as outpatient Explained to him the side effects of ciprofloxacin including tendinitis tendon rupture [name].  Also asked him to not take dairy products or minerals at the same time.Marland Kitchen

## 2021-01-02 NOTE — Progress Notes (Signed)
Physical Therapy Treatment Patient Details Name: Brendan Pruitt MRN: [id number] DOB: July 15, 1975 Today's Date: 01/02/2021    History of Present Illness Pt is a 45 y/o M with PMH: HTN, HLD, DM, L great toe amputation, and recent hospitalization 7/4-7/8 for sepsis 2/2 right foot ulcer and fifth metatarsal osteomyelitis undergoing I&D with excision of fifth metatarsal head. Pt presents this admission from podiatry office d/t concern for R foot osteomylitis. Confirmed on MRI. Pt underwent R partial 5th ray amputation, application of antibiotic beads, & rotational skin flap closure on 12/30/20 by Dr. Excell Seltzer.    PT Comments    Pt ready for session.  Dons shoes on his own then completes 1 lap around small unit with RW and mod I.  He elects to choose to walk 1 lap and education is provided regarding limiting gait to household distances and maintaining heel WB with post op shoe.    Stood for LLE AROM and continued education regarding importance of WB and limiting gait for healing.   Follow Up Recommendations  Home health PT;Supervision for mobility/OOB     Equipment Recommendations  Wheelchair (measurements PT);Wheelchair cushion (measurements PT);Rolling walker with 5" wheels;Other (comment)    Recommendations for Other Services       Precautions / Restrictions Precautions Precautions: Fall Restrictions Weight Bearing Restrictions: Yes RLE Weight Bearing: Partial weight bearing Other Position/Activity Restrictions: heel wt bearing with gait distances limited. Recommended pt use RW at all times. off loading shoe in room    Mobility  Bed Mobility               General bed mobility comments: Pt. up in chair upon arrival    Transfers Overall transfer level: Modified independent Equipment used: Rolling walker (2 wheeled) Transfers: Sit to/from Stand Sit to Stand: Modified independent (Device/Increase time)            Ambulation/Gait Ambulation/Gait assistance: Modified  independent (Device/Increase time) Gait Distance (Feet): 100 Feet Assistive device: Rolling walker (2 wheeled) Gait Pattern/deviations: Decreased step length - left;Decreased step length - right;Decreased stride length Gait velocity: decreased   General Gait Details: steady safe gait with good maintiaing of PWB/heel with post op boot.  Pt elects to walk a bit further today.   Stairs             Wheelchair Mobility    Modified Rankin (Stroke Patients Only)       Balance Overall balance assessment: Needs assistance;Modified Independent Sitting-balance support: Feet supported Sitting balance-Leahy Scale: Normal     Standing balance support: Bilateral upper extremity supported;During functional activity Standing balance-Leahy Scale: Good                              Cognition Arousal/Alertness: Awake/alert Behavior During Therapy: WFL for tasks assessed/performed Overall Cognitive Status: Within Functional Limits for tasks assessed                                        Exercises      General Comments        Pertinent Vitals/Pain Pain Assessment: Faces Faces Pain Scale: Hurts a little bit Pain Location: R foot Pain Descriptors / Indicators: Sore Pain Intervention(s): Limited activity within patient's tolerance;Monitored during session;Repositioned    Home Living  Prior Function            PT Goals (current goals can now be found in the care plan section) Progress towards PT goals: Progressing toward goals    Frequency    7X/week      PT Plan Current plan remains appropriate    Co-evaluation              AM-PAC PT "6 Clicks" Mobility   Outcome Measure  Help needed turning from your back to your side while in a flat bed without using bedrails?: None Help needed moving from lying on your back to sitting on the side of a flat bed without using bedrails?: None Help needed moving to  and from a bed to a chair (including a wheelchair)?: None Help needed standing up from a chair using your arms (e.g., wheelchair or bedside chair)?: None Help needed to walk in hospital room?: None Help needed climbing 3-5 steps with a railing? : A Little 6 Click Score: 23    End of Session Equipment Utilized During Treatment: Gait belt Activity Tolerance: Patient tolerated treatment well Patient left: in chair;with call bell/phone within reach;with chair alarm set;with nursing/sitter in room Nurse Communication: Mobility status PT Visit Diagnosis: Other abnormalities of gait and mobility (R26.89);Muscle weakness (generalized) (M62.81)     Time: 9937-1696 PT Time Calculation (min) (ACUTE ONLY): 17 min  Charges:  $Gait Training: 8-22 mins                    Danielle Dess, PTA 01/02/21, 12:42 PM , 12:39 PM

## 2021-01-02 NOTE — Progress Notes (Signed)
PROGRESS NOTE                                                                                                                                                                                                             Patient Demographics:    Brendan Pruitt, is a 45 y.o. male, DOB - 1975-11-01, KNL:[id number]  Outpatient Primary MD for the patient is Barbette Reichmann, MD    LOS - 4  Admit date - 12/29/2020    Chief Complaint  Patient presents with   Wound Infection    R 5th metatarsal       Brief Narrative (HPI from H&P)  Brendan Pruitt is a 45 y.o. male with medical history significant for hypertension, hyperlipidemia, diabetes mellitus, s/p left great toe amputation,  who was sent in by his podiatrist due to concerns for osteomyelitis, work-up suggestive of right foot abscess and osteomyelitis and he was admitted to the hospital.   Subjective:   Patient in bed, appears comfortable, denies any headache, no fever, no chest pain or pressure, no shortness of breath , no abdominal pain. No focal weakness.   Assessment  & Plan :     Right foot fifth metatarsal osteomyelitis with right foot abscess present on admission in a patient with history of diabetic foot infection - he has been appropriately placed on IV antibiotics empirically, ABIs were inconclusive but had good waveforms, MRSA nasal PCR negative, procalcitonin undetectable, operative cultures growing Pseudomonas also he is developing itchy macular rash likely from Rocephin, plan discussed with Dr. Renold Don ID on-call on 01/01/2021 he will be switched to Zosyn on 01/01/2021. PICC line placed left arm 01/02/2021, will monitor on Zosyn if stable likely discharge home on 01/02/2021, will get wound care instructions from podiatry.  2.  Nonspecific EKG changes.  Similar to EKG from July 2016, chest pain-free, troponin negative x2, Echo stable EF 60% no WMA.Marland Kitchen  3. Obesity BMI 33,  follow with PCP.  4.  Hypertension.  Blood pressure is soft hold off all blood pressure medications for now and monitor.  5.  Drug eruption.  Likely from Rocephin, see #1 above.    6. DM type II.  On sliding scale, will add Lantus.  Will monitor and adjust.  Holding home Meds Glucotrol and Glucophage along with [place].    Lab Results  Component Value  Date   HGBA1C 8.1 (H) 12/29/2020    CBG (last 3)  Recent Labs    01/01/21 1646 01/01/21 2202 01/02/21 0843  GLUCAP 159* 96 146*        Condition - Fair  Family Communication  :  None Present  Code Status :  Full  Consults  :  Podiatry, ID  PUD Prophylaxis :    Procedures  :     Left arm PICC line placed 01/02/2021.    TTE - EF 60%, no wall motion abnormality, chronic grade 1 diastolic dysfunction.    Right foot surgery by Dr. Rosetta Posner on 12/30/2020.  Right partial fifth ray amputation Rotational skin flap closure Application of antibiotic beads  ABI - good waveforms but inconclusive.  MRI R foot -  1. Osteomyelitis of the residual fifth metatarsal and fifth proximal phalanx shaft. 2. 1.5 cm soft tissue abscess surrounding the fifth metatarsal stump with overlying skin ulceration.      Disposition Plan  :    Status is: Inpatient  Remains inpatient appropriate because:IV treatments appropriate due to intensity of illness or inability to take PO  Dispo: The patient is from: Home              Anticipated d/c is to: Home              Patient currently is not medically stable to d/c.   Difficult to place patient No   DVT Prophylaxis  :  Lovenox    Lab Results  Component Value Date   PLT 199 01/02/2021    Diet :  Diet Order             Diet heart healthy/carb modified Room service appropriate? Yes; Fluid consistency: Thin  Diet effective now                    Inpatient Medications  Scheduled Meds:  amLODipine  10 mg Oral Daily   Chlorhexidine Gluconate Cloth  6 each Topical Daily    docusate sodium  200 mg Oral BID   enoxaparin (LOVENOX) injection  0.5 mg/kg Subcutaneous Q24H   insulin aspart  0-15 Units Subcutaneous TID WC   insulin aspart  0-5 Units Subcutaneous QHS   insulin glargine-yfgn  20 Units Subcutaneous Daily   levothyroxine  25 mcg Oral Daily   mupirocin ointment  1 application Nasal BID   polyethylene glycol  17 g Oral BID   sodium chloride flush  10-40 mL Intracatheter Q12H   Continuous Infusions:  sodium chloride Stopped (12/31/20 0935)   piperacillin-tazobactam (ZOSYN)  IV 3.375 g (01/02/21 0529)   PRN Meds:.sodium chloride, acetaminophen **OR** [DISCONTINUED] acetaminophen, diphenhydrAMINE, HYDROcodone-acetaminophen, [DISCONTINUED] ondansetron **OR** ondansetron (ZOFRAN) IV, sodium chloride flush  Antibiotics  :    Anti-infectives (From admission, onward)    Start     Dose/Rate Route Frequency Ordered Stop   01/01/21 1400  piperacillin-tazobactam (ZOSYN) IVPB 3.375 g        3.375 g 12.5 mL/hr over 240 Minutes Intravenous Every 8 hours 01/01/21 1039     12/31/20 1600  ceFEPIme (MAXIPIME) 2 g in sodium chloride 0.9 % 100 mL IVPB  Status:  Discontinued        2 g 200 mL/hr over 30 Minutes Intravenous Every 8 hours 12/31/20 1503 01/01/21 1039   12/31/20 0800  cefTRIAXone (ROCEPHIN) 2 g in sodium chloride 0.9 % 100 mL IVPB  Status:  Discontinued        2 g 200  mL/hr over 30 Minutes Intravenous Every 24 hours 12/30/20 2133 12/31/20 1503   12/31/20 0600  metroNIDAZOLE (FLAGYL) tablet 500 mg  Status:  Discontinued        500 mg Oral Every 8 hours 12/30/20 2133 01/01/21 1039   12/30/20 0842  vancomycin (VANCOCIN) powder  Status:  Discontinued          As needed 12/30/20 0843 12/30/20 0844   12/29/20 0600  vancomycin (VANCOCIN) IVPB 1000 mg/200 mL premix  Status:  Discontinued        1,000 mg 200 mL/hr over 60 Minutes Intravenous Every 12 hours 12/29/20 0426 01/01/21 1039   12/29/20 0600  piperacillin-tazobactam (ZOSYN) IVPB 3.375 g        3.375  g 12.5 mL/hr over 240 Minutes Intravenous Every 8 hours 12/29/20 0427 12/31/20 0202   12/29/20 0130  piperacillin-tazobactam (ZOSYN) IVPB 3.375 g        3.375 g 100 mL/hr over 30 Minutes Intravenous  Once 12/29/20 0121 12/29/20 0319   12/29/20 0130  vancomycin (VANCOCIN) IVPB 1000 mg/200 mL premix        1,000 mg 200 mL/hr over 60 Minutes Intravenous  Once 12/29/20 0124 12/29/20 0428        Time Spent in minutes  30   Susa RaringPrashant Neil Errickson M.D on 01/02/2021 at 10:29 AM  To page go to [email or web address]   [facility] -  Office  [phone]     See all Orders from today for further details    Objective:   Vitals:   01/01/21 1548 01/01/21 1938 01/02/21 0521 01/02/21 0913  BP: (!) 139/91 110/80 (!) 140/94 102/74  Pulse: 88 88 87 93  Resp: 18 17 17 17   Temp: 98.6 F (37 C) 98 F (36.7 C) 98.1 F (36.7 C) 98.1 F (36.7 C)  TempSrc:      SpO2: 100% 99% 97% 100%  Weight:      Height:        Wt Readings from Last 3 Encounters:  12/28/20 90.7 kg  12/13/20 93.9 kg  11/29/20 93.9 kg     Intake/Output Summary (Last 24 hours) at 01/02/2021 1029 Last data filed at 01/02/2021 1015 Gross per 24 hour  Intake 453.29 ml  Output --  Net 453.29 ml     Physical Exam  Awake Alert, No new F.N deficits, Normal affect [place].AT,PERRAL Supple Neck,No JVD, No cervical lymphadenopathy appriciated.  Symmetrical Chest wall movement, Good air movement bilaterally, CTAB RRR,No Gallops, Rubs or new Murmurs, No Parasternal Heave +ve B.Sounds, Abd Soft, No tenderness, No organomegaly appriciated, No rebound - guarding or rigidity.  He has developed some itchy macular rash in his right leg which looks like a drug eruption  Left big toe amputation, right foot postop under bandage with right big toe amputated      Data Review:    CBC Recent Labs  Lab 12/28/20 1944 12/29/20 0656 12/30/20 0307 12/31/20 0416 01/01/21 0406 01/02/21 0442  WBC 10.2 6.1 6.6 8.8 6.6 5.6  HGB 13.4 13.5  14.1 13.4 12.8* 13.4  HCT 38.4* 38.6* 40.5 38.0* 37.1* 39.1  PLT 232 216 216 197 189 199  MCV 94.3 94.4 92.7 95.0 94.4 94.0  MCH 32.9 33.0 32.3 33.5 32.6 32.2  MCHC 34.9 35.0 34.8 35.3 34.5 34.3  RDW 12.7 12.6 12.8 12.7 12.6 12.6  LYMPHSABS 2.9  --  1.0 1.6 1.9 1.4  MONOABS 0.7  --  0.8 1.0 0.7 0.6  EOSABS 0.2  --  0.3 0.3 0.3 0.3  BASOSABS 0.0  --  0.1 0.1 0.1 0.1    Recent Labs  Lab 12/28/20 1944 12/29/20 0656 12/29/20 1234 12/30/20 0307 12/31/20 0416 01/01/21 0406 01/02/21 0442  NA 134*  --   --  138 139 140 138  K 4.0  --   --  3.6 3.5 3.4* 3.7  CL 100  --   --  105 107 108 106  CO2 25  --   --  26 24 26 27   GLUCOSE 151*  --   --  84 93 102* 93  BUN 10  --   --  11 10 7 9   CREATININE 0.80 0.62  --  0.81 0.76 0.57* 0.68  CALCIUM 9.1  --   --  8.9 8.8* 8.8* 9.2  AST 32  --   --  22 18 17 24   ALT 37  --   --  31 27 22 26   ALKPHOS 98  --   --  91 86 81 80  BILITOT 0.9  --   --  1.0 0.8 0.9 0.6  ALBUMIN 4.1  --   --  3.8 3.4* 3.5 3.6  MG  --   --   --  2.2 1.9 1.9 2.0  CRP  --   --  1.0* 1.4* 2.3* 3.7* 3.5*  PROCALCITON  --   --  <0.10 <0.10 <0.10 <0.10 <0.10  LATICACIDVEN 0.8  --   --   --   --   --   --   INR  --   --  1.0  --   --   --   --   HGBA1C  --  8.1*  --   --   --   --   --   BNP  --   --   --  18.6 21.0 28.9 51.8    ------------------------------------------------------------------------------------------------------------------ No results for input(s): CHOL, HDL, LDLCALC, TRIG, CHOLHDL, LDLDIRECT in the last 72 hours.  Lab Results  Component Value Date   HGBA1C 8.1 (H) 12/29/2020   ------------------------------------------------------------------------------------------------------------------ No results for input(s): TSH, T4TOTAL, T3FREE, THYROIDAB in the last 72 hours.  Invalid input(s): FREET3  Cardiac Enzymes No results for input(s): CKMB, TROPONINI, MYOGLOBIN in the last 168 hours.  Invalid input(s):  CK ------------------------------------------------------------------------------------------------------------------    Component Value Date/Time   BNP 51.8 01/02/2021 0442      Radiology Reports    MR FOOT RIGHT WO CONTRAST  Result Date: 12/29/2020 CLINICAL DATA:  Right fifth ray infection.  Recent from amputation. EXAM: MRI OF THE RIGHT FOREFOOT WITHOUT CONTRAST TECHNIQUE: Multiplanar, multisequence MR imaging of the right forefoot was performed. No intravenous contrast was administered. COMPARISON:  MRI right foot dated November 14, 2020. FINDINGS: Bones/Joint/Cartilage Prior amputation of the distal fifth metatarsal and base of the fifth proximal phalanx. Abnormal marrow edema with corresponding decreased T1 marrow signal involving the residual fifth metatarsal and fifth proximal phalanx shaft. No fracture or dislocation. Mild degenerative changes of the first MTP joint. No joint effusion. Ligaments Collateral ligaments are intact.  Lisfranc ligament is intact. Muscles and Tendons No tenosynovitis. Increased T2 signal within the intrinsic muscles of the forefoot, nonspecific, but likely related to diabetic muscle changes. Soft tissue 1.5 x 1.1 x 1.0 cm surrounding the fifth metatarsal stump with overlying skin ulceration. No soft tissue mass. IMPRESSION: 1. Osteomyelitis of the residual fifth metatarsal and fifth proximal phalanx shaft. 2. 1.5 cm soft tissue abscess surrounding the fifth metatarsal stump with overlying skin ulceration. Electronically Signed  By: Obie Dredge M.D.   On: 12/29/2020 05:26   DG Chest Port 1 View  Result Date: 12/29/2020 CLINICAL DATA:  Right foot osteomyelitis.  No chest complaints EXAM: PORTABLE CHEST 1 VIEW COMPARISON:  11/23/2014 FINDINGS: Normal heart size and mediastinal contours. No acute infiltrate or edema. No effusion or pneumothorax. No acute osseous findings. Diffuse thoracic endplate spurring. IMPRESSION: Negative low volume chest. Electronically Signed    By: Marnee Spring M.D.   On: 12/29/2020 07:37

## 2021-01-02 NOTE — Discharge Instructions (Addendum)
Partial weightbearing with heel contact and surgical shoe. Keep your right foot clean and dry at all times first dressing change in Dr. Bernette Redbird office.  Follow with Primary MD Barbette Reichmann, MD in 7 days   Get CBC, CMP  -  checked next visit within 1 week by Primary MD    Activity: As tolerated with Full fall precautions use walker/cane & assistance as needed  Disposition Home  Diet: Heart Healthy Low Carb  Accuchecks 4 times/day, Once in AM empty stomach and then before each meal. Log in all results and show them to your Prim.MD in 3 days. If any glucose reading is under 80 or above 300 call your Prim MD immidiately. Follow Low glucose instructions for glucose under 80 as instructed.  Special Instructions: If you have smoked or chewed Tobacco  in the last 2 yrs please stop smoking, stop any regular Alcohol  and or any Recreational drug use.  On your next visit with your primary care physician please Get Medicines reviewed and adjusted.  Please request your Prim.MD to go over all Hospital Tests and Procedure/Radiological results at the follow up, please get all Hospital records sent to your Prim MD by signing hospital release before you go home.  If you experience worsening of your admission symptoms, develop shortness of breath, life threatening emergency, suicidal or homicidal thoughts you must seek medical attention immediately by calling 911 or calling your MD immediately  if symptoms less severe.  You Must read complete instructions/literature along with all the possible adverse reactions/side effects for all the Medicines you take and that have been prescribed to you. Take any new Medicines after you have completely understood and accpet all the possible adverse reactions/side effects.

## 2021-01-02 NOTE — Progress Notes (Signed)
PODIATRY / FOOT AND ANKLE SURGERY PROGRESS NOTE  Chief Complaint: Right foot wounds/infection   HPI: Brendan Pruitt is a 45 y.o. male who presents with status post 3 days right partial fifth ray amputation with placement of antibiotic beads and rotational skin flap closure.  Patient complains of mild pain overall to the area of the procedure site.  Patient has been partial weightbearing with heel contact shoe for short distances. Patient denies nausea, vomiting, fevers, chills.  PMHx:  Past Medical History:  Diagnosis Date   Acute osteomyelitis (HCC) 10/2019   phalanx left foot   Cellulitis and abscess of foot 10/2019   left.  using betadine wet to dry dressings.  treated with multiple antibiotics. 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   Hypertension    Neuromuscular disorder (HCC)    diabetic neuropathy    Surgical Hx:  Past Surgical History:  Procedure Laterality Date   AMPUTATION Right 12/30/2020   Procedure: Partial 5th Ray AMPUTATION;  Surgeon: Rosetta Posner, DPM;  Location: [facility];  Service: Podiatry;  Laterality: Right;   AMPUTATION TOE Left 11/06/2019   Procedure: AMPUTATION TOE IPJ LEFT HALLUX;  Surgeon: Rosetta Posner, DPM;  Location: [facility];  Service: Podiatry;  Laterality: Left;   CATARACT EXTRACTION W/PHACO Right 09/21/2020   Procedure: CATARACT EXTRACTION PHACO AND INTRAOCULAR LENS PLACEMENT (IOC) RIGHT DIABETIC;  Surgeon: Lockie Mola, MD;  Location: [facility];  Service: Ophthalmology;  Laterality: Right;  Diabetic - oral meds cde 9.48 01:48.8 minutes 8.7%   CATARACT EXTRACTION W/PHACO Left 10/12/2020   Procedure: CATARACT EXTRACTION PHACO AND INTRAOCULAR LENS PLACEMENT (IOC) LEFT DIABETIC 8.16 01:37.0;  Surgeon: Lockie Mola, MD;  Location: [facility];  Service: Ophthalmology;  Laterality: Left;  Diabetic - oral meds    I & D EXTREMITY Right 11/14/2020   Procedure: IRRIGATION AND DEBRIDEMENT EXTREMITY;  Surgeon: Linus Galas, DPM;  Location: [facility];  Service: Podiatry;  Laterality: Right;   LUMBAR DISC SURGERY  2010   no metal   UMBILICAL HERNIA REPAIR  2010   mesh     FHx:  Family History  Problem Relation Age of Onset   Diabetes Mother    Diabetes Sister     Social History:  reports that he quit smoking about 6 years ago. His smoking use included cigarettes. He has a 1.00 pack-year smoking history. He has never used smokeless tobacco. He reports current alcohol use of about 20.0 standard drinks per week. He reports that he does not use drugs.  Allergies:  Allergies  Allergen Reactions   Other Itching    Brown Paper towels    Medications Prior to Admission  Medication Sig Dispense Refill   acetaminophen (TYLENOL) 500 MG tablet Take 500 mg by mouth every 6 (six) hours as needed for mild pain, moderate pain or headache.     dapagliflozin propanediol (FARXIGA) 10 MG TABS tablet Take 10 mg by mouth daily.     gabapentin (NEURONTIN) 300 MG capsule Take 1 capsule by mouth 2 (two) times daily.     glipiZIDE (GLUCOTROL XL) 10 MG 24 hr tablet Take 10 mg by mouth 2 (two) times daily.     levothyroxine (SYNTHROID) 25 MCG tablet Take 25 mcg by mouth daily.     losartan (COZAAR) 25 MG tablet Take 25 mg by mouth daily.     metFORMIN (GLUCOPHAGE) 1000  MG tablet Take 1,000 mg by mouth 2 (two) times daily with a meal.      Multiple Vitamin (ONE-A-DAY MENS PO) Take 1 tablet by mouth daily.     mupirocin ointment (BACTROBAN) 2 % Apply 1 application topically in the morning and at bedtime.     Omega-3 Fatty Acids (OMEGA 3 PO) Take 1,080 mg by mouth in the morning and at bedtime.     OZEMPIC, 1 MG/DOSE, 4 MG/3ML SOPN Inject 1 mg into the skin every Sunday.      SANTYL ointment Apply topically.     traMADol (ULTRAM) 50 MG tablet Take 50 mg by mouth 3 (three) times daily as needed.     amoxicillin-clavulanate  (AUGMENTIN) 875-125 MG tablet Take 1 tablet by mouth 2 (two) times daily. (Patient not taking: Reported on 12/29/2020) 28 tablet 1   nirmatrelvir & ritonavir (PAXLOVID) 20 x 150 MG & 10 x 100MG  TBPK Take by mouth. (Patient not taking: Reported on 12/29/2020)      Physical Exam: General: Alert and oriented.  No apparent distress.  Vascular: DP/PT pulses palpable bilateral, hair growth noted to digits of both feet bilateral.  Capillary fill time intact to digits both feet.  Mild edema present to the right lateral foot consistent with postoperative course, no associated erythema.  Neuro: Light touch sensation nearly absent to bilateral lower extremities.  Derm: Right partial fifth ray amputation site appears to have skin edges well coapted with sutures intact, capillary fill time appears to be intact to flaps that were created for skin closure.  Mild edema, minimal erythema, minimal serous drainage, no odor noted.  Right plantar fourth metatarsal phalangeal joint wounds x2 appear to be stable at this time.  No signs of infection present, wound bases appear to be granular with minimal depth.       MSK: Left partial hallux amputation, right partial fifth ray amputation.  Results for orders placed or performed during the hospital encounter of 12/29/20 (from the past 48 hour(s))  Glucose, capillary     Status: Abnormal   Collection Time: 12/31/20  4:31 PM  Result Value Ref Range   Glucose-Capillary 184 (H) 70 - 99 mg/dL    Comment: Glucose reference range applies only to samples taken after fasting for at least 8 hours.  Glucose, capillary     Status: Abnormal   Collection Time: 12/31/20  9:17 PM  Result Value Ref Range   Glucose-Capillary 114 (H) 70 - 99 mg/dL    Comment: Glucose reference range applies only to samples taken after fasting for at least 8 hours.   Comment 1 Notify RN   Procalcitonin     Status: None   Collection Time: 01/01/21  4:06 AM  Result Value Ref Range    Procalcitonin <0.10 ng/mL    Comment:        Interpretation: PCT (Procalcitonin) <= 0.5 ng/mL: Systemic infection (sepsis) is not likely. Local bacterial infection is possible. (NOTE)       Sepsis PCT Algorithm           Lower Respiratory Tract                                      Infection PCT Algorithm    ----------------------------     ----------------------------         PCT < 0.25 ng/mL  PCT < 0.10 ng/mL          Strongly encourage             Strongly discourage   discontinuation of antibiotics    initiation of antibiotics    ----------------------------     -----------------------------       PCT 0.25 - 0.50 ng/mL            PCT 0.10 - 0.25 ng/mL               OR       >80% decrease in PCT            Discourage initiation of                                            antibiotics      Encourage discontinuation           of antibiotics    ----------------------------     -----------------------------         PCT >= 0.50 ng/mL              PCT 0.26 - 0.50 ng/mL               AND        <80% decrease in PCT             Encourage initiation of                                             antibiotics       Encourage continuation           of antibiotics    ----------------------------     -----------------------------        PCT >= 0.50 ng/mL                  PCT > 0.50 ng/mL               AND         increase in PCT                  Strongly encourage                                      initiation of antibiotics    Strongly encourage escalation           of antibiotics                                     -----------------------------                                           PCT <= 0.25 ng/mL                                                 OR                                        >  80% decrease in PCT                                      Discontinue / Do not initiate                                             antibiotics  Performed at [facility], [address] [place], [place] [address]   Magnesium     Status: None   Collection Time: 01/01/21  4:06 AM  Result Value Ref Range   Magnesium 1.9 1.7 - 2.4 mg/dL    Comment: Performed at [facility], [address] [place], [place] [address]  C-reactive protein     Status: Abnormal   Collection Time: 01/01/21  4:06 AM  Result Value Ref Range   CRP 3.7 (H) <1.0 mg/dL    Comment: Performed at [facility], [address] [place], [place] [address]  Comprehensive metabolic panel     Status: Abnormal   Collection Time: 01/01/21  4:06 AM  Result Value Ref Range   Sodium 140 135 - 145 mmol/L   Potassium 3.4 (L) 3.5 - 5.1 mmol/L   Chloride 108 98 - 111 mmol/L   CO2 26 22 - 32 mmol/L   Glucose, Bld 102 (H) 70 - 99 mg/dL    Comment: Glucose reference range applies only to samples taken after fasting for at least 8 hours.   BUN 7 6 - 20 mg/dL   Creatinine, Ser 8.18 (L) 0.61 - 1.24 mg/dL   Calcium 8.8 (L) 8.9 - 10.3 mg/dL   Total Protein 6.7 6.5 - 8.1 g/dL   Albumin 3.5 3.5 - 5.0 g/dL   AST 17 15 - 41 U/L   ALT 22 0 - 44 U/L   Alkaline Phosphatase 81 38 - 126 U/L   Total Bilirubin 0.9 0.3 - 1.2 mg/dL   GFR, Estimated >29 >93 mL/min    Comment: (NOTE) Calculated using the CKD-EPI Creatinine Equation (2021)    Anion gap 6 5 - 15    Comment: Performed at [facility], [address] [place], [place] [address]  CBC with Differential/Platelet     Status: Abnormal   Collection Time: 01/01/21  4:06 AM  Result Value Ref Range   WBC 6.6 4.0 - 10.5 K/uL   RBC 3.93 (L) 4.22 - 5.81 MIL/uL   Hemoglobin 12.8 (L) 13.0 - 17.0 g/dL   HCT 78.9 (L) 38.1 - 01.7 %   MCV 94.4 80.0 - 100.0 fL   MCH 32.6 26.0 - 34.0 pg   MCHC 34.5 30.0 - 36.0 g/dL   RDW 51.0 25.8 - 52.7 %   Platelets 189 150 - 400 K/uL   nRBC 0.0 0.0 - 0.2 %   Neutrophils Relative % 56 %   Neutro Abs 3.7 1.7 - 7.7 K/uL   Lymphocytes Relative 29 %   Lymphs Abs 1.9 0.7 - 4.0 K/uL   Monocytes  Relative 10 %   Monocytes Absolute 0.7 0.1 - 1.0 K/uL   Eosinophils Relative 4 %   Eosinophils Absolute 0.3 0.0 - 0.5 K/uL   Basophils Relative 1 %   Basophils Absolute 0.1 0.0 - 0.1 K/uL   Immature Granulocytes 0 %   Abs Immature Granulocytes 0.02 0.00 -  0.07 K/uL    Comment: Performed at [facility], [address] [place], [place] [address]  Brain natriuretic peptide     Status: None   Collection Time: 01/01/21  4:06 AM  Result Value Ref Range   B Natriuretic Peptide 28.9 0.0 - 100.0 pg/mL    Comment: Performed at [facility], [address] [place], [place] [address]  Glucose, capillary     Status: Abnormal   Collection Time: 01/01/21  8:18 AM  Result Value Ref Range   Glucose-Capillary 123 (H) 70 - 99 mg/dL    Comment: Glucose reference range applies only to samples taken after fasting for at least 8 hours.  Glucose, capillary     Status: Abnormal   Collection Time: 01/01/21 11:35 AM  Result Value Ref Range   Glucose-Capillary 106 (H) 70 - 99 mg/dL    Comment: Glucose reference range applies only to samples taken after fasting for at least 8 hours.  Glucose, capillary     Status: Abnormal   Collection Time: 01/01/21  4:46 PM  Result Value Ref Range   Glucose-Capillary 159 (H) 70 - 99 mg/dL    Comment: Glucose reference range applies only to samples taken after fasting for at least 8 hours.  Glucose, capillary     Status: None   Collection Time: 01/01/21 10:02 PM  Result Value Ref Range   Glucose-Capillary 96 70 - 99 mg/dL    Comment: Glucose reference range applies only to samples taken after fasting for at least 8 hours.   Comment 1 Notify RN   Procalcitonin     Status: None   Collection Time: 01/02/21  4:42 AM  Result Value Ref Range   Procalcitonin <0.10 ng/mL    Comment:        Interpretation: PCT (Procalcitonin) <= 0.5 ng/mL: Systemic infection (sepsis) is not likely. Local bacterial infection is possible. (NOTE)       Sepsis PCT  Algorithm           Lower Respiratory Tract                                      Infection PCT Algorithm    ----------------------------     ----------------------------         PCT < 0.25 ng/mL                PCT < 0.10 ng/mL          Strongly encourage             Strongly discourage   discontinuation of antibiotics    initiation of antibiotics    ----------------------------     -----------------------------       PCT 0.25 - 0.50 ng/mL            PCT 0.10 - 0.25 ng/mL               OR       >80% decrease in PCT            Discourage initiation of                                            antibiotics      Encourage discontinuation  of antibiotics    ----------------------------     -----------------------------         PCT >= 0.50 ng/mL              PCT 0.26 - 0.50 ng/mL               AND        <80% decrease in PCT             Encourage initiation of                                             antibiotics       Encourage continuation           of antibiotics    ----------------------------     -----------------------------        PCT >= 0.50 ng/mL                  PCT > 0.50 ng/mL               AND         increase in PCT                  Strongly encourage                                      initiation of antibiotics    Strongly encourage escalation           of antibiotics                                     -----------------------------                                           PCT <= 0.25 ng/mL                                                 OR                                        > 80% decrease in PCT                                      Discontinue / Do not initiate                                             antibiotics  Performed at [facility], [address] [place], [place] [address]   Magnesium     Status: None   Collection Time: 01/02/21  4:42 AM  Result Value Ref Range   Magnesium 2.0 1.7 - 2.4 mg/dL  Comment: Performed at  [facility], [address] [place], [place] [address]  C-reactive protein     Status: Abnormal   Collection Time: 01/02/21  4:42 AM  Result Value Ref Range   CRP 3.5 (H) <1.0 mg/dL    Comment: Performed at [facility], [address], [place], [place] [address]  Comprehensive metabolic panel     Status: None   Collection Time: 01/02/21  4:42 AM  Result Value Ref Range   Sodium 138 135 - 145 mmol/L   Potassium 3.7 3.5 - 5.1 mmol/L   Chloride 106 98 - 111 mmol/L   CO2 27 22 - 32 mmol/L   Glucose, Bld 93 70 - 99 mg/dL    Comment: Glucose reference range applies only to samples taken after fasting for at least 8 hours.   BUN 9 6 - 20 mg/dL   Creatinine, Ser 0.98 0.61 - 1.24 mg/dL   Calcium 9.2 8.9 - 11.9 mg/dL   Total Protein 7.1 6.5 - 8.1 g/dL   Albumin 3.6 3.5 - 5.0 g/dL   AST 24 15 - 41 U/L   ALT 26 0 - 44 U/L   Alkaline Phosphatase 80 38 - 126 U/L   Total Bilirubin 0.6 0.3 - 1.2 mg/dL   GFR, Estimated >14 >78 mL/min    Comment: (NOTE) Calculated using the CKD-EPI Creatinine Equation (2021)    Anion gap 5 5 - 15    Comment: Performed at [facility], [address] [place], [place] [address]  CBC with Differential/Platelet     Status: Abnormal   Collection Time: 01/02/21  4:42 AM  Result Value Ref Range   WBC 5.6 4.0 - 10.5 K/uL   RBC 4.16 (L) 4.22 - 5.81 MIL/uL   Hemoglobin 13.4 13.0 - 17.0 g/dL   HCT 13.0 86.5 - 78.4 %   MCV 94.0 80.0 - 100.0 fL   MCH 32.2 26.0 - 34.0 pg   MCHC 34.3 30.0 - 36.0 g/dL   RDW 69.6 29.5 - 28.4 %   Platelets 199 150 - 400 K/uL   nRBC 0.0 0.0 - 0.2 %   Neutrophils Relative % 60 %   Neutro Abs 3.4 1.7 - 7.7 K/uL   Lymphocytes Relative 24 %   Lymphs Abs 1.4 0.7 - 4.0 K/uL   Monocytes Relative 10 %   Monocytes Absolute 0.6 0.1 - 1.0 K/uL   Eosinophils Relative 5 %   Eosinophils Absolute 0.3 0.0 - 0.5 K/uL   Basophils Relative 1 %   Basophils Absolute 0.1 0.0 - 0.1 K/uL   Immature Granulocytes 0 %   Abs  Immature Granulocytes 0.01 0.00 - 0.07 K/uL    Comment: Performed at [facility], [address] [place], [place] [address]  Brain natriuretic peptide     Status: None   Collection Time: 01/02/21  4:42 AM  Result Value Ref Range   B Natriuretic Peptide 51.8 0.0 - 100.0 pg/mL    Comment: Performed at [facility], [address] [place], [place] [address]  Glucose, capillary     Status: Abnormal   Collection Time: 01/02/21  8:43 AM  Result Value Ref Range   Glucose-Capillary 146 (H) 70 - 99 mg/dL    Comment: Glucose reference range applies only to samples taken after fasting for at least 8 hours.  Glucose, capillary     Status: None   Collection Time: 01/02/21 11:38 AM  Result Value Ref Range   Glucose-Capillary 87  70 - 99 mg/dL    Comment: Glucose reference range applies only to samples taken after fasting for at least 8 hours.   No results found.  Blood pressure 114/88, pulse 94, temperature 97.6 F (36.4 C), resp. rate 16, height  (1.651 m), weight 90.7 kg, SpO2 100 %.  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 -Foot appears to be very stable at this time as there appears to be minimal erythema, mild edema consistent with postoperative course, no dehiscence present to the surgical site currently.  Wounds also present to the plantar fourth metatarsal phalange joint appeared to be stable with no signs of infection present. -Appreciate infectious disease recommendations for antibiotic therapy.  So far bone culture and wound culture have not grown anything to date.  Pathology specimen pending.  Wound culture growing Pseudomonas.  Still would recommend proceeding  with IV antibiotics upon discharge due to potential for residual fifth metatarsal base infection based on MRI imaging.  Would like to try to save this as if this area gets removed it could lead to an equinovarus contracture which could ultimately affect the patient's foot function and could require further surgery in the future. -Redressed today with Betadine paint to ulcerations and incision line, Xeroform to the incision line followed by 4 x 4 gauze, ABD, Kerlix, Ace wrap.  Patient to keep dressings clean, dry, and intact.  Patient to keep dressing clean, dry, and intact until postoperative visit. -Patient to continue with partial weightbearing with heel contact and surgical shoe.  -ABIs reviewed, triphasic to biphasic flow noted but did have some elevated ABIs indicative of calcinosis of arteries.  This is also able to be seen on x-ray as multiple arteries are seen throughout the foot and ankle that are calcified.  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  Patient was recently seen by Dr. Gilda Crease on an outpatient basis and no intervention was indicated at that time.  If patient has trouble healing the wound would likely recommend follow-up with vascular surgery for potential revascularization procedure.  Podiatry team to sign off at this time.  Patient to follow-up in 1 week in outpatient clinic.  Rosetta Posner, DPM 01/02/2021, 12:49 PM

## 2021-01-03 LAB — PROCALCITONIN: Procalcitonin: <0.1 ng/mL

## 2021-01-03 LAB — GLUCOSE, CAPILLARY
Glucose-Capillary: 109 mg/dL — ABNORMAL HIGH (ref 70–99)
Glucose-Capillary: 184 mg/dL — ABNORMAL HIGH (ref 70–99)

## 2021-01-03 LAB — SEDIMENTATION RATE: Sed Rate: 59 mm/hr — ABNORMAL HIGH (ref 0–15)

## 2021-01-03 MED ORDER — HYDROCODONE-ACETAMINOPHEN 5-325 MG PO TABS: 1.0000 | ORAL_TABLET | Freq: Three times a day (TID) | ORAL | 0 refills | Status: DC | PRN

## 2021-01-03 MED ORDER — METRONIDAZOLE 500 MG PO TABS: 500.0000 mg | ORAL_TABLET | Freq: Three times a day (TID) | ORAL | 0 refills | Status: AC

## 2021-01-03 MED ORDER — CIPROFLOXACIN HCL 500 MG PO TABS: 500.0000 mg | ORAL_TABLET | Freq: Two times a day (BID) | ORAL | 0 refills | Status: AC

## 2021-01-03 NOTE — TOC Progression Note (Signed)
Transition of Care [facility]) - Progression Note    Patient Details  Name: Brendan Pruitt MRN: [id number] Date of Birth: Nov 26, 1975  Transition of Care [facility]) CM/SW Contact  Caryn Section, RN Phone Number: 01/03/2021, 9:46 AM  Clinical Narrative:   Patient lives at home with his wife, who can assist him on discharge.  He states that he has no issues getting to appointments or obtaining medications and he takes medications as prescribed.   Home Health agencies have declined providing inpatient HH due to patient's insurance.  Care team okayed patient going to outpatient therapy, request sent to outpatient at [facility].  Patient is agreeable, as he states he is already a patient due to sciatica.  DME provided through [employer] to be delivered to patient's room.   Patient has no further concerns about returning home at this point.  TOC contact information given.         Expected Discharge Plan and Services           Expected Discharge Date: 01/03/21                                     Social Determinants of Health (SDOH) Interventions    Readmission Risk Interventions No flowsheet data found.

## 2021-01-03 NOTE — Plan of Care (Signed)
Patient alert and oriented x 4, complains of pain to right lower lower extremity relieved with prn pain medications. Left upper extremity PICC patent, clean, dry and intact. Remain on peripheral antibiotics at this time. Vitals stable, no respiratory distress on room air. Dressing to extremity dry and intact. Stable condtion at end of shift. Will continue to monitor.  Problem: Education: Goal: Knowledge of General Education information will improve Description: Including pain rating scale, medication(s)/side effects and non-pharmacologic comfort measures Outcome: Progressing   Problem: Health Behavior/Discharge Planning: Goal: Ability to manage health-related needs will improve Outcome: Progressing   Problem: Clinical Measurements: Goal: Ability to maintain clinical measurements within normal limits will improve Outcome: Progressing Goal: Will remain free from infection Outcome: Progressing Goal: Diagnostic test results will improve Outcome: Progressing Goal: Respiratory complications will improve Outcome: Progressing Goal: Cardiovascular complication will be avoided Outcome: Progressing   Problem: Activity: Goal: Risk for activity intolerance will decrease Outcome: Progressing   Problem: Nutrition: Goal: Adequate nutrition will be maintained Outcome: Progressing   Problem: Coping: Goal: Level of anxiety will decrease Outcome: Progressing   Problem: Elimination: Goal: Will not experience complications related to bowel motility Outcome: Progressing Goal: Will not experience complications related to urinary retention Outcome: Progressing   Problem: Pain Managment: Goal: General experience of comfort will improve Outcome: Progressing   Problem: Safety: Goal: Ability to remain free from injury will improve Outcome: Progressing

## 2021-01-03 NOTE — Discharge Summary (Addendum)
Brendan Pruitt GNF:[id number] DOB: [birth year] DOA: 12/29/2020  PCP: Barbette Reichmann, MD  Admit date: 12/29/2020  Discharge date: 01/03/2021  Admitted From: Home  Disposition:  Home   Recommendations for Outpatient Follow-up:   Follow up with PCP in 1-2 weeks  PCP Please obtain BMP/CBC, 2 view CXR in 1week,  (see Discharge instructions)   PCP Please follow up on the following pending results:    Home Health: None   Equipment/Devices: None  Consultations:Podiatry, ID Discharge Condition: Stable    CODE STATUS: Full    Diet Recommendation: Heart Healthy Low Carb    Chief Complaint  Patient presents with   Wound Infection    R 5th metatarsal     Brief history of present illness from the day of admission and additional interim summary     Brendan Pruitt is a 45 y.o. male with medical history significant for hypertension, hyperlipidemia, diabetes mellitus, s/p left great toe amputation,  who was sent in by his podiatrist due to concerns for osteomyelitis, work-up suggestive of right foot abscess and osteomyelitis and he was admitted to the hospital.                                                                   Hospital Course    Right foot fifth metatarsal osteomyelitis with right foot abscess present on admission in a patient with history of diabetic foot infection - he has been appropriately placed on IV antibiotics empirically, ABIs were inconclusive but had good waveforms, MRSA nasal PCR negative, procalcitonin undetectable, he was seen by podiatry and ID underwent ray amputation of right fifth toe, no abscess was found intraoperatively, cultures growing Pseudomonas placed on Cipro Flagyl for 4 weeks by ID, discharged home with outpatient follow-up with podiatry and PCP, first dressing change and  follow-up wound care will be done by podiatry after the next office visit.  Confirmed with Dr. Excell Seltzer, partial weightbearing instructions on the right foot with surgical boot on, written instructions given to the patient.   2.  Nonspecific EKG changes.  Similar to EKG from July 2016, chest pain-free, troponin negative x2, Echo stable EF 60% no WMA.  Follow-up with PCP for secondary prevention.   3. Obesity BMI 33, follow with PCP.   4.  Hypertension.  Blood pressure is soft hold off all blood pressure medications for now and monitor.   5.  Drug eruption.  Likely from Rocephin, improved after Rocephin was stopped.  Could have mild Rocephin allergy.   6. DM type II.  Continue home regimen requested to do CBGs [facility] maintain a logbook and show it to PCP in a week for further adjustments.  Lab Results  Component Value Date   HGBA1C 8.1 (H) 12/29/2020     Discharge diagnosis  Active Problems:   History of amputation of left great toe (HCC)   Diabetes mellitus, type II (HCC)   Diabetic ulcer of right foot associated with type 2 diabetes mellitus, with fat layer exposed (HCC)   HTN (hypertension)   Diabetic neuropathy (HCC)   Abscess of right foot   Acute osteomyelitis of metatarsal bone of right foot [facility])    Discharge instructions    Discharge Instructions     Discharge instructions   Complete by: As directed    Partial weightbearing with heel contact and surgical shoe. Keep your right foot clean and dry at all times first dressing change in Dr. Bernette Redbird office.  Follow with Primary MD Barbette Reichmann, MD in 7 days   Get CBC, CMP  -  checked next visit within 1 week by Primary MD    Activity: As tolerated with Full fall precautions use walker/cane & assistance as needed  Disposition Home  Diet: Heart Healthy Low Carb  Accuchecks 4 times/day, Once in AM empty stomach and then before each meal. Log in all results and show them to your Prim.MD in 3 days. If any glucose  reading is under 80 or above 300 call your Prim MD immidiately. Follow Low glucose instructions for glucose under 80 as instructed.  Special Instructions: If you have smoked or chewed Tobacco  in the last 2 yrs please stop smoking, stop any regular Alcohol  and or any Recreational drug use.  On your next visit with your primary care physician please Get Medicines reviewed and adjusted.  Please request your Prim.MD to go over all Hospital Tests and Procedure/Radiological results at the follow up, please get all Hospital records sent to your Prim MD by signing hospital release before you go home.  If you experience worsening of your admission symptoms, develop shortness of breath, life threatening emergency, suicidal or homicidal thoughts you must seek medical attention immediately by calling 911 or calling your MD immediately  if symptoms less severe.  You Must read complete instructions/literature along with all the possible adverse reactions/side effects for all the Medicines you take and that have been prescribed to you. Take any new Medicines after you have completely understood and accpet all the possible adverse reactions/side effects.   Discharge wound care:   Complete by: As directed    Keep your right foot clean and dry first dressing change at the podiatrist office, subsequent wound care as per the podiatrist.   Increase activity slowly   Complete by: As directed        Discharge Medications   Allergies as of 01/03/2021       Reactions   Other Itching   Manson Passey Paper towels        Medication List     STOP taking these medications    amoxicillin-clavulanate 875-125 MG tablet Commonly known as: Augmentin       TAKE these medications    acetaminophen 500 MG tablet Commonly known as: TYLENOL Take 500 mg by mouth every 6 (six) hours as needed for mild pain, moderate pain or headache.   ciprofloxacin 500 MG tablet Commonly known as: Cipro Take 1 tablet (500 mg  total) by mouth 2 (two) times daily.   dapagliflozin propanediol 10 MG Tabs tablet Commonly known as: FARXIGA Take 10 mg by mouth daily. Notes to patient: Not given in hospital   gabapentin 300 MG capsule Commonly known as: NEURONTIN Take 1 capsule by mouth 2 (two) times daily. Notes to patient: Not given in  hospital   glipiZIDE 10 MG 24 hr tablet Commonly known as: GLUCOTROL XL Take 10 mg by mouth 2 (two) times daily. Notes to patient: Not given in hospital   HYDROcodone-acetaminophen 5-325 MG tablet Commonly known as: NORCO/VICODIN Take 1-2 tablets by mouth 3 (three) times daily as needed for severe pain.   levothyroxine 25 MCG tablet Commonly known as: SYNTHROID Take 25 mcg by mouth daily.   losartan 25 MG tablet Commonly known as: COZAAR Take 25 mg by mouth daily.   metFORMIN 1000 MG tablet Commonly known as: GLUCOPHAGE Take 1,000 mg by mouth 2 (two) times daily with a meal. Notes to patient: Not given in hospital   metroNIDAZOLE 500 MG tablet Commonly known as: Flagyl Take 1 tablet (500 mg total) by mouth 3 (three) times daily for 7 days.   mupirocin ointment 2 % Commonly known as: BACTROBAN Apply 1 application topically in the morning and at bedtime.   nirmatrelvir & ritonavir 20 x 150 MG & 10 x  Tbpk Commonly known as: PAXLOVID Take by mouth.   OMEGA 3 PO Take 1,080 mg by mouth in the morning and at bedtime.   ONE-A-DAY MENS PO Take 1 tablet by mouth daily.   Ozempic (1 MG/DOSE) 4 MG/3ML Sopn Generic drug: Semaglutide (1 MG/DOSE) Inject 1 mg into the skin every /23/22 0953              Discharge Care Instructions  (From admission, onward)           Start     Ordered   01/03/21 0000  Discharge wound care:       Comments: Keep your right foot clean and dry first dressing change at the podiatrist office, subsequent wound care as per the podiatrist.   01/03/21 0829             Follow-up Information     Rosetta Posner, DPM. Schedule an appointment as soon as possible for a visit in 1 week(s).   Specialty: Podiatry Why: at [address] Contact information: [address] [place] [address] [phone]         Barbette Reichmann, MD. Schedule an appointment as soon as possible for a visit on 01/06/2021.   Specialty: Internal Medicine Why: at 1245 Contact information: [address] [place]  [address] [phone]                 Major procedures and Radiology Reports - PLEASE review detailed and final reports thoroughly  -       MR Lumbar Spine W Wo Contrast  Result Date: 12/15/2020 CLINICAL DATA:  Left-sided sciatica. Low back pain radiating down the left buttock and leg to the knee. Remote history of lumbar surgery. EXAM: MRI LUMBAR SPINE WITHOUT AND WITH CONTRAST TECHNIQUE: Multiplanar and multiecho pulse sequences of the lumbar spine were obtained without and with intravenous contrast. CONTRAST:  50mL GADAVIST GADOBUTROL 1 MMOL/ML IV SOLN COMPARISON:  11/16/2009 FINDINGS: Segmentation:  Standard. Alignment:  Normal. Vertebrae: No fracture, suspicious marrow lesion, or significant marrow edema. Conus medullaris and cauda equina: Conus extends to the T12 level and appears normal. There is a thin fatty filum. Paraspinal and other soft tissues: Unremarkable. Disc levels: Disc desiccation at L3-4 and L4-5. T12-L1: Negative. L1-2: Mild facet hypertrophy without disc herniation or stenosis. L2-3: New mild left eccentric disc bulging and mild facet hypertrophy without stenosis. L3-4: Interval left laminectomy and microdiscectomy. There is a shallow left central disc protrusion with enhancing annular fissure versus enhancing postoperative scarring. There is also mild disc bulging with a left foraminal to extraforaminal disc protrusion and mild facet hypertrophy. These findings result in borderline bilateral lateral recess stenosis without spinal or neural foraminal stenosis. L4-5: Interval left laminotomy and microdiscectomy with epidural fibrosis in the left lateral recess. Disc bulging, a shallow central disc protrusion, congenitally short pedicles, and mild facet hypertrophy result in mild-to-moderate spinal stenosis, mild-to-moderate right and mild left lateral recess stenosis, and mild left neural foraminal stenosis. L5-S1: A broad central/left central disc protrusion has enlarged and  together with mild facet hypertrophy result in mild left greater than right lateral recess stenosis, potentially irritating the left S1 nerve root although without clear neural compression demonstrated. There is no generalized spinal stenosis or neural foraminal stenosis. IMPRESSION: 1. Interval microdiscectomies at L3-4 and L4-5 without significant residual or recurrent stenosis at L3-4. 2. Mild-to-moderate spinal and right greater than left lateral recess stenosis at L4-5. 3. Enlargement of a disc protrusion at L5-S1 with mild left greater than right lateral recess stenosis. 4. New mild disc bulging at L2-3 without stenosis. Electronically Signed   By: Sebastian Ache M.D.   On: 12/15/2020 15:32   MR FOOT RIGHT WO CONTRAST  Result Date: 12/29/2020 CLINICAL DATA:  Right fifth ray infection.  Recent from amputation. EXAM: MRI OF THE RIGHT FOREFOOT WITHOUT CONTRAST TECHNIQUE: Multiplanar, multisequence MR imaging of the right forefoot was performed. No intravenous contrast was administered. COMPARISON:  MRI right foot dated November 14, 2020. FINDINGS: Bones/Joint/Cartilage Prior amputation of the distal fifth metatarsal and base of  the fifth proximal phalanx. Abnormal marrow edema with corresponding decreased T1 marrow signal involving the residual fifth metatarsal and fifth proximal phalanx shaft. No fracture or dislocation. Mild degenerative changes of the first MTP joint. No joint effusion. Ligaments Collateral ligaments are intact.  Lisfranc ligament is intact. Muscles and Tendons No tenosynovitis. Increased T2 signal within the intrinsic muscles of the forefoot, nonspecific, but likely related to diabetic muscle changes. Soft tissue 1.5 x 1.1 x 1.0 cm surrounding the fifth metatarsal stump with overlying skin ulceration. No soft tissue mass. IMPRESSION: 1. Osteomyelitis of the residual fifth metatarsal and fifth proximal phalanx shaft. 2. 1.5 cm soft tissue abscess surrounding the fifth metatarsal stump with  overlying skin ulceration. Electronically Signed   By: Obie Dredge M.D.   On: 12/29/2020 05:26   US ARTERIAL ABI (SCREENING LOWER EXTREMITY)  Result Date: 12/29/2020 CLINICAL DATA:  History of peripheral arterial disease, toe amputation EXAM: NONINVASIVE PHYSIOLOGIC VASCULAR STUDY OF BILATERAL LOWER EXTREMITIES TECHNIQUE: Evaluation of both lower extremities were performed at rest, including calculation of ankle-brachial indices with single level Doppler, pressure and pulse volume recording. COMPARISON:  None. FINDINGS: Right ABI:  1.54 Left ABI:  1.46 Right Lower Extremity:  Normal arterial waveforms at the ankle. Left Lower Extremity:  Normal arterial waveforms at the ankle. 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 Normal arterial waveforms noted in both lower extremities by Doppler. Electronically Signed   By: Olive Bass M.D.   On: 12/29/2020 11:42   DG Chest Port 1 View  Result Date: 12/29/2020 CLINICAL DATA:  Right foot osteomyelitis.  No chest complaints EXAM: PORTABLE CHEST 1 VIEW COMPARISON:  11/23/2014 FINDINGS: Normal heart size and mediastinal contours. No acute infiltrate or edema. No effusion or pneumothorax. No acute osseous findings. Diffuse thoracic endplate spurring. IMPRESSION: Negative low volume chest. Electronically Signed   By: Marnee Spring M.D.   On: 12/29/2020 07:37   DG Foot 2 Views Right  Result Date: 12/30/2020 CLINICAL DATA:  Status post right fifth metatarsal amputation EXAM: RIGHT FOOT - 2 VIEW COMPARISON:  Radiograph 11/14/2020 FINDINGS: Postsurgical changes of partial fifth ray amputation with residual base of fifth metatarsal. There are tiny radiopaque pellets in the surgical site, likely antibiotic beads. Expected postsurgical soft tissue changes. Vascular calcifications. IMPRESSION: Postsurgical changes of partial fifth ray  metatarsal amputation with antibiotic bead placement. Expected postsurgical soft tissue changes. Electronically Signed   By: Caprice Renshaw M.D.   On: 12/30/2020 13:08   ECHOCARDIOGRAM COMPLETE  Result Date: 12/31/2020    ECHOCARDIOGRAM REPORT   Patient Name:   Brendan Pruitt Date of Exam: 12/30/2020 Medical Rec #:  [id number]       Height:       65.0 in Accession #:    [id number]      Weight:       200.0 lb Date of Birth:  [birth year]       BSA:          1.978 m Patient Age:    45 years        BP:           91/64 mmHg Patient Gender: M               HR:           90 bpm. Exam Location:  [facility] Procedure: 2D Echo, Cardiac Doppler, Color Doppler and Strain Analysis Indications:     CHF-Acute  Diastolic 428.31 / I50.31  History:         Patient has no prior history of Echocardiogram examinations.                  Risk Factors:Hypertension and Diabetes.  Sonographer:     Neysa Bonitohristy Roar Referring Phys:  [id number] Rosetta PosnerANDREW BAKER Diagnosing Phys: Julien Nordmannimothy Gollan MD IMPRESSIONS  1. Left ventricular ejection fraction, by estimation, is 60 to 65%. The left ventricle has normal function. The left ventricle has no regional wall motion abnormalities. Left ventricular diastolic parameters are consistent with Grade I diastolic dysfunction (impaired relaxation). The average left ventricular global longitudinal strain is -17.7 %. The global longitudinal strain is normal.  2. Right ventricular systolic function is normal. The right ventricular size is normal.  3. Left atrial size was mildly dilated.  4. The mitral valve is normal in structure. Mild mitral valve regurgitation. FINDINGS  Left Ventricle: Left ventricular ejection fraction, by estimation, is 60 to 65%. The left ventricle has normal function. The left ventricle has no regional wall motion abnormalities. The average left ventricular global longitudinal strain is -17.7 %. The global longitudinal strain is normal. The left ventricular internal cavity size was normal in size. There is  no left ventricular hypertrophy. Left ventricular diastolic parameters are consistent with Grade I diastolic dysfunction (impaired relaxation). Right Ventricle: The right ventricular size is normal. No increase in right ventricular wall thickness. Right ventricular systolic function is normal. Left Atrium: Left atrial size was mildly dilated. Right Atrium: Right atrial size was normal in size. Pericardium: There is no evidence of pericardial effusion. Mitral Valve: The mitral valve is normal in structure. Mild mitral valve regurgitation. No evidence of mitral valve stenosis. Tricuspid Valve: The tricuspid valve is normal in structure. Tricuspid valve regurgitation is mild . No evidence of tricuspid stenosis. Aortic Valve: The aortic valve is normal in structure. Aortic valve regurgitation is not visualized. No aortic stenosis is present. Aortic valve peak gradient measures 7.1 mmHg. Pulmonic Valve: The pulmonic valve was normal in structure. Pulmonic valve regurgitation is not visualized. No evidence of pulmonic stenosis. Aorta: The aortic root is normal in size and structure. Venous: The inferior vena cava is normal in size with greater than 50% respiratory variability, suggesting right atrial pressure of 3 mmHg. IAS/Shunts: No atrial level shunt detected by color flow Doppler.  LEFT VENTRICLE PLAX 2D LVIDd:         5.04 cm  Diastology LVIDs:         3.81 cm  LV e' medial:    5.87 cm/s LV PW:         1.19 cm  LV E/e' medial:  15.5 LV IVS:        1.37 cm  LV e' lateral:   9.68 cm/s LVOT diam:     2.30 cm  LV E/e' lateral: 9.4 LVOT Area:     4.15 cm                         2D Longitudinal Strain                         2D Strain GLS Avg:     -17.7 % RIGHT VENTRICLE RV Basal diam:  3.64 cm RV Mid diam:    3.38 cm RV S prime:     11.70 cm/s TAPSE (M-mode): 2.0 cm LEFT ATRIUM  Index       RIGHT ATRIUM           Index LA diam:        4.30 cm 2.17 cm/m  RA Area:     16.70 cm LA Vol (A2C):   64.6 ml 32.66  ml/m RA Volume:   43.30 ml  21.89 ml/m LA Vol (A4C):   79.1 ml 39.99 ml/m LA Biplane Vol: 71.9 ml 36.35 ml/m  AORTIC VALVE                PULMONIC VALVE AV Area (Vmax): 2.89 cm    PV Vmax:        1.67 m/s AV Vmax:        133.00 cm/s PV Peak grad:   11.2 mmHg AV Peak Grad:   7.1 mmHg    RVOT Peak grad: 2 mmHg LVOT Vmax:      92.60 cm/s  AORTA Ao Asc diam: 2.70 cm MITRAL VALVE MV Area (PHT): 5.31 cm     SHUNTS MV Decel Time: 143 msec     Systemic Diam: 2.30 cm MV E velocity: 90.70 cm/s MV A velocity: 106.00 cm/s MV E/A ratio:  0.86 MV A Prime:    13.2 cm/s Julien Nordmann MD Electronically signed by Julien Nordmann MD Signature Date/Time: 12/31/2020/9:50:21 AM    Final    [place] EKG SITE RITE  Result Date: 12/31/2020 If Site Rite image not attached, placement could not be confirmed due to current cardiac rhythm.   Micro Results     Recent Results (from the past 240 hour(s))  Resp Panel by RT-PCR (Flu A&B, Covid) Nasopharyngeal Swab     Status: None   Collection Time: 12/29/20  1:45 AM   Specimen: Nasopharyngeal Swab; Nasopharyngeal(NP) swabs in vial transport medium  Result Value Ref Range Status   SARS Coronavirus 2 by RT PCR NEGATIVE NEGATIVE Final    Comment: (NOTE) SARS-CoV-2 target nucleic acids are NOT DETECTED.  The SARS-CoV-2 RNA is generally detectable in upper respiratory specimens during the acute phase of infection. The lowest concentration of SARS-CoV-2 viral copies this assay can detect is 138 copies/mL. A negative result does not preclude SARS-Cov-2 infection and should not be used as the sole basis for treatment or other patient management decisions. A negative result may occur with  improper specimen collection/handling, submission of specimen other than nasopharyngeal swab, presence of viral mutation(s) within the areas targeted by this assay, and inadequate number of viral copies(<138 copies/mL). A negative result must be combined with clinical observations, patient  history, and epidemiological information. The expected result is Negative.  Fact Sheet for Patients:  BloggerCourse.com  Fact Sheet for Healthcare Providers:  SeriousBroker.it  This test is no t yet approved or cleared by the [place] FDA and  has been authorized for detection and/or diagnosis of SARS-CoV-2 by FDA under an Emergency Use Authorization (EUA). This EUA will remain  in effect (meaning this test can be used) for the duration of the COVID-19 declaration under Section 564(b)(1) of the Act, 21 U.S.C.section 360bbb-3(b)(1), unless the authorization is terminated  or revoked sooner.       Influenza A by PCR NEGATIVE NEGATIVE Final   Influenza B by PCR NEGATIVE NEGATIVE Final    Comment: (NOTE) The Xpert Xpress SARS-CoV-2/FLU/RSV plus assay is intended as an aid in the diagnosis of influenza from Nasopharyngeal swab specimens and should not be used as a sole basis for treatment. Nasal washings and aspirates are unacceptable for Xpert Xpress  SARS-CoV-2/FLU/RSV testing.  Fact Sheet for Patients: BloggerCourse.com  Fact Sheet for Healthcare Providers: SeriousBroker.it  This test is not yet approved or cleared by the [place] FDA and has been authorized for detection and/or diagnosis of SARS-CoV-2 by FDA under an Emergency Use Authorization (EUA). This EUA will remain in effect (meaning this test can be used) for the duration of the COVID-19 declaration under Section 564(b)(1) of the Act, 21 U.S.C. section 360bbb-3(b)(1), unless the authorization is terminated or revoked.  Performed at [facility], [address], [place], [place] [address]   Surgical PCR screen     Status: None   Collection Time: 12/29/20 10:13 PM   Specimen: Nasal Mucosa; Nasal Swab  Result Value Ref Range Status   MRSA, PCR NEGATIVE NEGATIVE Final   Staphylococcus aureus  NEGATIVE NEGATIVE Final    Comment: (NOTE) The Xpert SA Assay (FDA approved for NASAL specimens in patients 52 years of age and older), is one component of a comprehensive surveillance program. It is not intended to diagnose infection nor to guide or monitor treatment. Performed at [facility], [address] [place], [place] [address]   Aerobic/Anaerobic Culture w Gram Stain (surgical/deep wound)     Status: None (Preliminary result)   Collection Time: 12/30/20  8:02 AM   Specimen: Other Source; Tissue  Result Value Ref Range Status   Specimen Description   Final    WOUND Performed at [facility], [address] [place], [place] [address]    Special Requests   Final    RIGHT FOOT WOUND Performed at [facility], [address] [place], [place] [address]    Gram Stain   Final    NO WBC SEEN NO ORGANISMS SEEN Performed at [facility], [address] [place], [place] [address]    Culture   Final    RARE PSEUDOMONAS AERUGINOSA NO ANAEROBES ISOLATED; CULTURE IN PROGRESS FOR 5 DAYS    Report Status PENDING  Incomplete   Organism ID, Bacteria PSEUDOMONAS AERUGINOSA  Final      Susceptibility   Pseudomonas aeruginosa - MIC*    CEFTAZIDIME <=1 SENSITIVE Sensitive     CIPROFLOXACIN <=0.25 SENSITIVE Sensitive     GENTAMICIN <=1 SENSITIVE Sensitive     IMIPENEM 2 SENSITIVE Sensitive     PIP/TAZO <=4 SENSITIVE Sensitive     CEFEPIME 1 SENSITIVE Sensitive     * RARE PSEUDOMONAS AERUGINOSA  Aerobic/Anaerobic Culture w Gram Stain (surgical/deep wound)     Status: None (Preliminary result)   Collection Time: 12/30/20  8:09 AM   Specimen: Other Source; Tissue  Result Value Ref Range Status   Specimen Description   Final    TISSUE Performed at [facility], [address] [place], [place] [address]    Special Requests   Final    RIGHT 5TH METATERSAL BONE CULTURE Performed at [facility], [address]  [place], [place] [address]    Gram Stain NO WBC SEEN NO ORGANISMS SEEN   Final   Culture   Final    NO GROWTH 4 DAYS NO ANAEROBES ISOLATED; CULTURE IN PROGRESS FOR 5 DAYS Performed at [facility], [address] [place], [place] [address]    Report Status PENDING  Incomplete    Today   Subjective    Brendan Pruitt today has no headache,no chest abdominal pain,no new weakness tingling or numbness, feels much better wants to go home today.    Objective  Blood pressure 104/72, pulse 86, temperature 98.1 F (36.7 C), resp. rate 15, height  (1.651 m), weight 90.7 kg, SpO2 98 %.   Intake/Output Summary (Last 24 hours) at 01/03/2021 1354 Last data filed at 01/03/2021 1010 Gross per 24 hour  Intake 1350 ml  Output --  Net 1350 ml    Exam  Awake Alert, No new F.N deficits, Normal affect [place].AT,PERRAL Supple Neck,No JVD, No cervical lymphadenopathy appriciated.  Symmetrical Chest wall movement, Good air movement bilaterally, CTAB RRR,No Gallops,Rubs or new Murmurs, No Parasternal Heave +ve B.Sounds, Abd Soft, Non tender, No organomegaly appriciated, No rebound -guarding or rigidity. Right foot under bandage, no surrounding cellulitis   Data Review   CBC w Diff:  Lab Results  Component Value Date   WBC 5.6 01/02/2021   HGB 13.4 01/02/2021   HCT 39.1 01/02/2021   PLT 199 01/02/2021   LYMPHOPCT 24 01/02/2021   MONOPCT 10 01/02/2021   EOSPCT 5 01/02/2021   BASOPCT 1 01/02/2021    CMP:  Lab Results  Component Value Date   NA 138 01/02/2021   K 3.7 01/02/2021   CL 106 01/02/2021   CO2 27 01/02/2021   BUN 9 01/02/2021   CREATININE 0.68 01/02/2021   PROT 7.1 01/02/2021   ALBUMIN 3.6 01/02/2021   BILITOT 0.6 01/02/2021   ALKPHOS 80 01/02/2021   AST 24 01/02/2021   ALT 26 01/02/2021  .   Total Time in preparing paper work, data evaluation and todays exam - 35 minutes  Susa Raring M.D on 01/03/2021 at 1:54 PM  [facility]

## 2021-01-03 NOTE — Progress Notes (Signed)
Pt discharged to home.  PICC line removed without complication.  AVS given to pt and explained with no further questions.  All belongings at bedside taken with pt.  Pt transported off unit via WC.

## 2021-01-04 LAB — AEROBIC/ANAEROBIC CULTURE W GRAM STAIN (SURGICAL/DEEP WOUND)
Culture: NO GROWTH
Gram Stain: NONE SEEN
Gram Stain: NONE SEEN

## 2021-01-11 ENCOUNTER — Encounter: Payer: Self-pay | Admitting: Podiatry

## 2021-01-19 ENCOUNTER — Other Ambulatory Visit: Payer: Self-pay

## 2021-01-19 ENCOUNTER — Other Ambulatory Visit
Admission: RE | Admit: 2021-01-19 | Discharge: 2021-01-19 | Disposition: A | Discharge status: Home / Self Care | Payer: Commercial Managed Care - PPO | Source: Ambulatory Visit | Attending: Infectious Diseases | Admitting: Infectious Diseases

## 2021-01-19 ENCOUNTER — Ambulatory Visit: Payer: Commercial Managed Care - PPO | Attending: Infectious Diseases | Admitting: Infectious Diseases

## 2021-01-19 VITALS — BP 113/84 | HR 96 | Resp 16 | Ht 65.0 in | Wt 200.0 lb

## 2021-01-19 DIAGNOSIS — Z7984 Long term (current) use of oral hypoglycemic drugs: Secondary | ICD-10-CM | POA: Insufficient documentation

## 2021-01-19 DIAGNOSIS — E1140 Type 2 diabetes mellitus with diabetic neuropathy, unspecified: Secondary | ICD-10-CM | POA: Diagnosis not present

## 2021-01-19 DIAGNOSIS — E11628 Type 2 diabetes mellitus with other skin complications: Secondary | ICD-10-CM | POA: Diagnosis present

## 2021-01-19 DIAGNOSIS — L089 Local infection of the skin and subcutaneous tissue, unspecified: Secondary | ICD-10-CM

## 2021-01-19 DIAGNOSIS — B965 Pseudomonas (aeruginosa) (mallei) (pseudomallei) as the cause of diseases classified elsewhere: Secondary | ICD-10-CM | POA: Insufficient documentation

## 2021-01-19 DIAGNOSIS — I10 Essential (primary) hypertension: Secondary | ICD-10-CM | POA: Insufficient documentation

## 2021-01-19 DIAGNOSIS — Z833 Family history of diabetes mellitus: Secondary | ICD-10-CM | POA: Diagnosis not present

## 2021-01-19 DIAGNOSIS — Z79899 Other long term (current) drug therapy: Secondary | ICD-10-CM | POA: Diagnosis not present

## 2021-01-19 DIAGNOSIS — Z87891 Personal history of nicotine dependence: Secondary | ICD-10-CM | POA: Diagnosis not present

## 2021-01-19 LAB — COMPREHENSIVE METABOLIC PANEL
ALT: 27 U/L (ref 0–44)
AST: 24 U/L (ref 15–41)
Albumin: 3.8 g/dL (ref 3.5–5.0)
Alkaline Phosphatase: 81 U/L (ref 38–126)
Anion gap: 8 (ref 5–15)
BUN: 11 mg/dL (ref 6–20)
CO2: 24 mmol/L (ref 22–32)
Calcium: 8.9 mg/dL (ref 8.9–10.3)
Chloride: 103 mmol/L (ref 98–111)
Creatinine, Ser: 0.69 mg/dL (ref 0.61–1.24)
GFR, Estimated: >60 mL/min (ref >60)
Glucose, Bld: 159 mg/dL — ABNORMAL HIGH (ref 70–99)
Potassium: 3.6 mmol/L (ref 3.5–5.1)
Sodium: 135 mmol/L (ref 135–145)
Total Bilirubin: 0.6 mg/dL (ref 0.3–1.2)
Total Protein: 7 g/dL (ref 6.5–8.1)

## 2021-01-19 LAB — CBC WITH DIFFERENTIAL/PLATELET
Abs Immature Granulocytes: 0.02 10*3/uL (ref 0.00–0.07)
Basophils Absolute: 0.1 10*3/uL (ref 0.0–0.1)
Basophils Relative: 1 %
Eosinophils Absolute: 0.2 10*3/uL (ref 0.0–0.5)
Eosinophils Relative: 3 %
HCT: 39.4 % (ref 39.0–52.0)
Hemoglobin: 13.9 g/dL (ref 13.0–17.0)
Immature Granulocytes: 0 %
Lymphocytes Relative: 34 %
Lymphs Abs: 2.3 10*3/uL (ref 0.7–4.0)
MCH: 32.8 pg (ref 26.0–34.0)
MCHC: 35.3 g/dL (ref 30.0–36.0)
MCV: 92.9 fL (ref 80.0–100.0)
Monocytes Absolute: 0.7 10*3/uL (ref 0.1–1.0)
Monocytes Relative: 10 %
Neutro Abs: 3.5 10*3/uL (ref 1.7–7.7)
Neutrophils Relative %: 52 %
Platelets: 213 10*3/uL (ref 150–400)
RBC: 4.24 MIL/uL (ref 4.22–5.81)
RDW: 13 % (ref 11.5–15.5)
WBC: 6.7 10*3/uL (ref 4.0–10.5)
nRBC: 0 % (ref 0.0–0.2)

## 2021-01-19 LAB — C-REACTIVE PROTEIN: CRP: 1 mg/dL — ABNORMAL HIGH (ref <1.0)

## 2021-01-19 LAB — SEDIMENTATION RATE: Sed Rate: 27 mm/hr — ABNORMAL HIGH (ref 0–15)

## 2021-01-19 NOTE — Progress Notes (Signed)
NAME: Brendan Pruitt  DOB: [birth year]  MRN: [id number]  Date/Time: 01/19/2021 10:08 AM  Subjective:   ?Follow-up visit for foot wound.   Brendan Pruitt is a 45 y.o. male with a history of DM, , HTN, diabetic foot infection 2 hospitalizations in the recent past  First time was between 7/4-11/18/20 with rt 5th foot infection and underwent metatarsal head amputation. The bone had osteo and the proximal margins could not be identified because of orientation-so he has been treated with 3 weeks of IVs Zosyn followed by 2 weeks of p.o. Augmentin. He was readmitted 8/18- 8/23.22  with residual osteomyelitis and infection and underwent fifth ray excision on 12/30/2020.  Culture Pseudomonas.He was treated with zosyn and sent home on cipro and flagyl for 4 weeks. He is here for follow up. He is doing very well- the sutures were removed yesterday. He is 100% adherent to cipro ( which he will take for 30 days)  and has completed flagyl No diarrhea, no fever, no rash. Bone had osteo but margin was free of osteo Has peripheral neuropathy- pain and numbness   Past Medical History:  Diagnosis Date   Acute osteomyelitis (HCC) 10/2019   phalanx left foot   Cellulitis and abscess of foot 10/2019   left.  using betadine wet to dry dressings.  treated with multiple antibiotics. 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   Hypertension    Neuromuscular disorder (HCC)    diabetic neuropathy    Past Surgical History:  Procedure Laterality Date   AMPUTATION Right 12/30/2020   Procedure: Partial 5th Ray AMPUTATION;  Surgeon: Rosetta Posner, DPM;  Location: [facility];  Service: Podiatry;  Laterality: Right;   AMPUTATION TOE Left 11/06/2019   Procedure: AMPUTATION TOE IPJ LEFT HALLUX;  Surgeon: Rosetta Posner, DPM;  Location: [facility];  Service: Podiatry;  Laterality: Left;   CATARACT EXTRACTION W/PHACO Right  09/21/2020   Procedure: CATARACT EXTRACTION PHACO AND INTRAOCULAR LENS PLACEMENT (IOC) RIGHT DIABETIC;  Surgeon: Lockie Mola, MD;  Location: [facility];  Service: Ophthalmology;  Laterality: Right;  Diabetic - oral meds cde 9.48 01:48.8 minutes 8.7%   CATARACT EXTRACTION W/PHACO Left 10/12/2020   Procedure: CATARACT EXTRACTION PHACO AND INTRAOCULAR LENS PLACEMENT (IOC) LEFT DIABETIC 8.16 01:37.0;  Surgeon: Lockie Mola, MD;  Location: [facility];  Service: Ophthalmology;  Laterality: Left;  Diabetic - oral meds   I & D EXTREMITY Right 11/14/2020   Procedure: IRRIGATION AND DEBRIDEMENT EXTREMITY;  Surgeon: Linus Galas, DPM;  Location: [facility];  Service: Podiatry;  Laterality: Right;   LUMBAR DISC SURGERY  2010   no metal   UMBILICAL HERNIA REPAIR  2010   mesh     Social History   Socioeconomic History   Marital status: Married    Spouse name: Publishing rights manager   Number of children: Not on file   Years of education: Not on file   Highest education level: Not on file  Occupational History   Not on file  Tobacco Use   Smoking status: Former    Packs/day: 0.25    Years: 4.00    Pack years: 1.00    Types: Cigarettes    Quit date: 2016    Years since quitting: 6.6   Smokeless tobacco: Never  Vaping Use   Vaping Use: Never used  Substance and Sexual Activity   Alcohol use: Yes  Alcohol/week: 20.0 standard drinks    Types: 20 Cans of beer per week    Comment: does not drink much lately   Drug use: No   Sexual activity: Yes  Other Topics Concern   Not on file  Social History Narrative   Patient lives with his wife and 5 children.   Social Determinants of Health   Financial Resource Strain: Not on file  Food Insecurity: Not on file  Transportation Needs: Not on file  Physical Activity: Not on file  Stress: Not on file  Social Connections: Not on file  Intimate Partner Violence: Not on file    Family History  Problem Relation Age of Onset    Diabetes Mother    Diabetes Sister    Allergies  Allergen Reactions   Other Itching    Manson Passey Paper towels   I? Current Outpatient Medications  Medication Sig Dispense Refill   acetaminophen (TYLENOL) 500 MG tablet Take 500 mg by mouth every 6 (six) hours as needed for mild pain, moderate pain or headache.     ciprofloxacin (CIPRO) 500 MG tablet Take 1 tablet (500 mg total) by mouth 2 (two) times daily. 60 tablet 0   dapagliflozin propanediol (FARXIGA) 10 MG TABS tablet Take 10 mg by mouth daily.     gabapentin (NEURONTIN) 300 MG capsule Take 1 capsule by mouth 2 (two) times daily.     glipiZIDE (GLUCOTROL XL) 10 MG 24 hr tablet Take 10 mg by mouth 2 (two) times daily.     HYDROcodone-acetaminophen (NORCO/VICODIN) 5-325 MG tablet Take 1-2 tablets by mouth 3 (three) times daily as needed for severe pain. 15 tablet 0   levothyroxine (SYNTHROID) 25 MCG tablet Take 25 mcg by mouth daily.     losartan (COZAAR) 25 MG tablet Take 25 mg by mouth daily.     metFORMIN (GLUCOPHAGE) 1000 MG tablet Take 1,000 mg by mouth 2 (two) times daily with a meal.      Multiple Vitamin (ONE-A-DAY MENS PO) Take 1 tablet by mouth daily.     mupirocin ointment (BACTROBAN) 2 % Apply 1 application topically in the morning and at bedtime.     nirmatrelvir & ritonavir (PAXLOVID) 20 x 150 MG & 10 x 100MG  TBPK Take by mouth.     Omega-3 Fatty Acids (OMEGA 3 PO) Take 1,080 mg by mouth in the morning and at bedtime.     OZEMPIC, 1 MG/DOSE, 4 MG/3ML SOPN Inject 1 mg into the skin every Sunday.      SANTYL ointment Apply topically.     traMADol (ULTRAM) 50 MG tablet Take 50 mg by mouth 3 (three) times daily as needed.     No current facility-administered medications for this visit.     Abtx:  Anti-infectives (From admission, onward)    None       REVIEW OF SYSTEMS:  Const: negative fever, negative chills, negative weight loss Eyes: negative diplopia or visual changes, negative eye pain ENT: negative coryza,  negative sore throat Resp: negative cough, hemoptysis, dyspnea Cards: negative for chest pain, palpitations, lower extremity edema GU: negative for frequency, dysuria and hematuria GI: Negative for abdominal pain, diarrhea, bleeding, constipation Skin: negative for rash and pruritus Heme: negative for easy bruising and gum/nose bleeding MS: negative for myalgias, arthralgias, back pain and muscle weakness Neurolo:has neuropathy Psych: negative for feelings of anxiety, depression  Endocrine:  diabetes Allergy/Immunology- negative for any medication or food allergies ? Objective:  VITALS:  BP 113/84   Pulse 96  Resp 16   Ht 5\' 5"  (1.651 m)   Wt 200 lb (90.7 kg)   SpO2 98%   BMI 33.28 kg/m  PHYSICAL EXAM:  General: Alert, cooperative, no distress, appears stated age.  Head: Normocephalic, without obvious abnormality, atraumatic. Eyes: Conjunctivae clear, anicteric sclerae. Pupils are equal ENT Nares normal. No drainage or sinus tenderness. Lips, mucosa, and tongue normal. No Thrush Neck: Supple, symmetrical, no adenopathy, thyroid: non tender no carotid bruit and no JVD. Back: No CVA tenderness. Lungs: Clear to auscultation bilaterally. No Wheezing or Rhonchi. No rales. Heart: Regular rate and rhythm, no murmur, rub or gallop. Abdomen: Soft, non-tender,not distended. Bowel sounds normal. No masses Extremities: rt foot 5th toe ray excision- surgical site healed well- sutures off- some scabs   Left foot- callus       Skin: No rashes or lesions. Or bruising Lymph: Cervical, supraclavicular normal. Neurologic: Grossly non-focal Pertinent Labs Lab Results CBC    Component Value Date/Time   WBC 5.6 01/02/2021 0442   RBC 4.16 (L) 01/02/2021 0442   HGB 13.4 01/02/2021 0442   HCT 39.1 01/02/2021 0442   PLT 199 01/02/2021 0442   MCV 94.0 01/02/2021 0442   MCH 32.2 01/02/2021 0442   MCHC 34.3 01/02/2021 0442   RDW 12.6 01/02/2021 0442   LYMPHSABS 1.4 01/02/2021 0442    MONOABS 0.6 01/02/2021 0442   EOSABS 0.3 01/02/2021 0442   BASOSABS 0.1 01/02/2021 0442    CMP Latest Ref Rng & Units 01/02/2021 01/01/2021 12/31/2020  Glucose 70 - 99 mg/dL 93 01/02/2021) 93  BUN 6 - 20 mg/dL 9 7 10   Creatinine 0.61 - 1.24 mg/dL 347(Q ) 2.59  Sodium 135 - 145 mmol/L 138 140 139  Potassium 3.5 - 5.1 mmol/L 3.7 3.4(L) 3.5  Chloride 98 - 111 mmol/L 106 108 107  CO2 22 - 32 mmol/L 27 26 24   Calcium 8.9 - 10.3 mg/dL 9.2 5.63(O) 7.56)  Total Protein 6.5 - 8.1 g/dL 7.1 6.7 6.7  Total Bilirubin 0.3 - 1.2 mg/dL 0.6 0.9 0.8  Alkaline Phos 38 - 126 U/L 80 81 86  AST 15 - 41 U/L 24 17 18   ALT 0 - 44 U/L 26 22 27       Microbiology: pseudomonas  Impression/Recommendation ? 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 ___________________________________________________ Discussed with patient,  Note:  This document was prepared using Dragon voice recognition software and may include unintentional dictation errors.

## 2021-04-02 ENCOUNTER — Emergency Department: Payer: Commercial Managed Care - PPO

## 2021-04-02 ENCOUNTER — Other Ambulatory Visit: Payer: Self-pay

## 2021-04-02 ENCOUNTER — Encounter: Payer: Self-pay | Admitting: Emergency Medicine

## 2021-04-02 DIAGNOSIS — Z7984 Long term (current) use of oral hypoglycemic drugs: Secondary | ICD-10-CM | POA: Insufficient documentation

## 2021-04-02 DIAGNOSIS — R2241 Localized swelling, mass and lump, right lower limb: Secondary | ICD-10-CM | POA: Diagnosis present

## 2021-04-02 DIAGNOSIS — Z87891 Personal history of nicotine dependence: Secondary | ICD-10-CM | POA: Insufficient documentation

## 2021-04-02 DIAGNOSIS — I10 Essential (primary) hypertension: Secondary | ICD-10-CM | POA: Insufficient documentation

## 2021-04-02 DIAGNOSIS — Z79899 Other long term (current) drug therapy: Secondary | ICD-10-CM | POA: Diagnosis not present

## 2021-04-02 DIAGNOSIS — L03031 Cellulitis of right toe: Secondary | ICD-10-CM | POA: Insufficient documentation

## 2021-04-02 DIAGNOSIS — E1140 Type 2 diabetes mellitus with diabetic neuropathy, unspecified: Secondary | ICD-10-CM | POA: Insufficient documentation

## 2021-04-02 LAB — COMPREHENSIVE METABOLIC PANEL
ALT: 52 U/L — ABNORMAL HIGH (ref 0–44)
AST: 50 U/L — ABNORMAL HIGH (ref 15–41)
Albumin: 4 g/dL (ref 3.5–5.0)
Alkaline Phosphatase: 90 U/L (ref 38–126)
Anion gap: 6 (ref 5–15)
BUN: 13 mg/dL (ref 6–20)
CO2: 24 mmol/L (ref 22–32)
Calcium: 8.9 mg/dL (ref 8.9–10.3)
Chloride: 102 mmol/L (ref 98–111)
Creatinine, Ser: 0.67 mg/dL (ref 0.61–1.24)
GFR, Estimated: >60 mL/min (ref >60)
Glucose, Bld: 125 mg/dL — ABNORMAL HIGH (ref 70–99)
Potassium: 4 mmol/L (ref 3.5–5.1)
Sodium: 132 mmol/L — ABNORMAL LOW (ref 135–145)
Total Bilirubin: 0.6 mg/dL (ref 0.3–1.2)
Total Protein: 7.3 g/dL (ref 6.5–8.1)

## 2021-04-02 LAB — CBC WITH DIFFERENTIAL/PLATELET
Abs Immature Granulocytes: 0.03 10*3/uL (ref 0.00–0.07)
Basophils Absolute: 0.1 10*3/uL (ref 0.0–0.1)
Basophils Relative: 1 %
Eosinophils Absolute: 0.2 10*3/uL (ref 0.0–0.5)
Eosinophils Relative: 3 %
HCT: 41.9 % (ref 39.0–52.0)
Hemoglobin: 14.6 g/dL (ref 13.0–17.0)
Immature Granulocytes: 0 %
Lymphocytes Relative: 29 %
Lymphs Abs: 2.1 10*3/uL (ref 0.7–4.0)
MCH: 33.1 pg (ref 26.0–34.0)
MCHC: 34.8 g/dL (ref 30.0–36.0)
MCV: 95 fL (ref 80.0–100.0)
Monocytes Absolute: 0.8 10*3/uL (ref 0.1–1.0)
Monocytes Relative: 11 %
Neutro Abs: 4.1 10*3/uL (ref 1.7–7.7)
Neutrophils Relative %: 56 %
Platelets: 210 10*3/uL (ref 150–400)
RBC: 4.41 MIL/uL (ref 4.22–5.81)
RDW: 13 % (ref 11.5–15.5)
WBC: 7.3 10*3/uL (ref 4.0–10.5)
nRBC: 0 % (ref 0.0–0.2)

## 2021-04-02 NOTE — ED Triage Notes (Signed)
Pt in via POV, reports tripping a couple of days ago, getting minor cut to right great toe; since, toe has become red, swollen with some drainage from base of toenail.  Pt is concerned due to previous toe amputations.  Ambulatory to triage without difficulty.  NAD noted at this time.

## 2021-04-03 ENCOUNTER — Emergency Department
Admission: EM | Admit: 2021-04-03 | Discharge: 2021-04-03 | Disposition: A | Discharge status: Home / Self Care | Payer: Commercial Managed Care - PPO | Attending: Emergency Medicine | Admitting: Emergency Medicine

## 2021-04-03 DIAGNOSIS — L03031 Cellulitis of right toe: Secondary | ICD-10-CM

## 2021-04-03 MED ORDER — DOXYCYCLINE HYCLATE 100 MG PO CAPS: 100.0000 mg | ORAL_CAPSULE | Freq: Two times a day (BID) | ORAL | 0 refills | Status: AC

## 2021-04-03 MED ORDER — CEFADROXIL 500 MG PO CAPS: 500.0000 mg | ORAL_CAPSULE | Freq: Two times a day (BID) | ORAL | 0 refills | Status: AC

## 2021-04-03 NOTE — Discharge Instructions (Addendum)
It appears that you have some infection in your right great toe, but no drainable abscess or fluid collection at this time.  Please take the prescribed antibiotics and follow-up with Dr. Excell Seltzer at the next available clinic appointment.  Take the full course of antibiotics even if you are feeling better.  Return to the emergency department if you develop new or worsening symptoms that concern you.

## 2021-04-03 NOTE — ED Provider Notes (Signed)
[facility] Emergency Department Provider Note  ____________________________________________   Event Date/Time   First MD Initiated Contact with Patient 04/03/21 0105     (approximate)  I have reviewed the triage vital signs and the nursing notes.   HISTORY  Chief Complaint Wound Infection    HPI Brendan Pruitt is a 45 y.o. male with extensive prior history of problems with his feet including prior cellulitis and abscesses and osteomyelitis requiring amputation of his left great toe.  He has also had chronic problems on the right and sees Dr. Excell Seltzer with podiatry.  He presents tonight for evaluation of pain and swelling in the right great toe after he bumped his toe about 2 nights ago, just after seeing Dr. Excell Seltzer in clinic.  He said that his right great toenail is loose and it has been "leaking fluid" from around it.  He is worried he might be getting an infection.  Onset was acute and the pain is occasionally severe although it is better now.  Nothing in particular makes it better and pushing on the toe makes it worse.  He denies fever, sore throat, chest pain, shortness of breath, nausea, vomiting, and abdominal pain.     Past Medical History:  Diagnosis Date   Acute osteomyelitis (HCC) 10/2019   phalanx left foot   Cellulitis and abscess of foot 10/2019   left.  using betadine wet to dry dressings.  treated with multiple antibiotics. 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   Hypertension    Neuromuscular disorder (HCC)    diabetic neuropathy    Patient Active Problem List   Diagnosis Date Noted   Abscess of right foot 12/29/2020   Acute osteomyelitis of metatarsal bone of right foot (HCC) 12/29/2020   Pain    Diabetic foot infection (HCC) 11/14/2020   Diabetic ulcer of right foot associated with type 2 diabetes mellitus, with fat  layer exposed (HCC) 11/14/2020   Sepsis (HCC) 11/14/2020   Alcohol use 11/14/2020   Cellulitis of right foot 11/14/2020   HTN (hypertension) 11/14/2020   Diabetic neuropathy (HCC) 11/14/2020   Cellulitis of right lower extremity    Diabetes mellitus, type II (HCC) 09/12/2020   Ulcers of both lower legs (HCC) 09/12/2020   History of amputation of left great toe (HCC) 05/31/2020   Blurring of vision 03/11/2020   History of 2019 novel coronavirus disease (COVID-19) 03/11/2020   Diabetic polyneuropathy associated with type 2 diabetes mellitus (HCC) 10/17/2019   Hyperlipemia, mixed 08/02/2015   Tobacco use disorder 09/17/2014    Past Surgical History:  Procedure Laterality Date   AMPUTATION Right 12/30/2020   Procedure: Partial 5th Ray AMPUTATION;  Surgeon: Rosetta Posner, DPM;  Location: [facility];  Service: Podiatry;  Laterality: Right;   AMPUTATION TOE Left 11/06/2019   Procedure: AMPUTATION TOE IPJ LEFT HALLUX;  Surgeon: Rosetta Posner, DPM;  Location: [facility];  Service: Podiatry;  Laterality: Left;   CATARACT EXTRACTION W/PHACO Right 09/21/2020   Procedure: CATARACT EXTRACTION PHACO AND INTRAOCULAR LENS PLACEMENT (IOC) RIGHT DIABETIC;  Surgeon: Lockie Mola, MD;  Location: [facility];  Service: Ophthalmology;  Laterality: Right;  Diabetic - oral meds cde 9.48 01:48.8 minutes 8.7%   CATARACT EXTRACTION W/PHACO Left 10/12/2020   Procedure: CATARACT EXTRACTION PHACO AND INTRAOCULAR LENS PLACEMENT (IOC) LEFT DIABETIC 8.16 01:37.0;  Surgeon: Lockie Mola, MD;  Location:  [facility];  Service: Ophthalmology;  Laterality: Left;  Diabetic - oral meds   I & D EXTREMITY Right 11/14/2020   Procedure: IRRIGATION AND DEBRIDEMENT EXTREMITY;  Surgeon: Linus Galas, DPM;  Location: [facility];  Service: Podiatry;  Laterality: Right;   LUMBAR DISC SURGERY  2010   no metal   UMBILICAL HERNIA REPAIR  2010   mesh     Prior to Admission medications   Medication Sig Start Date  End Date Taking? Authorizing Provider  cefadroxil (DURICEF) 500 MG capsule Take 1 capsule (500 mg total) by mouth 2 (two) times daily for 10 days. 04/03/21 04/13/21 Yes Loleta Rose, MD  doxycycline (VIBRAMYCIN) 100 MG capsule Take 1 capsule (100 mg total) by mouth 2 (two) times daily for 10 days. 04/03/21 04/13/21 Yes Loleta Rose, MD  acetaminophen (TYLENOL) 500 MG tablet Take 500 mg by mouth every 6 (six) hours as needed for mild pain, moderate pain or headache.    [provider]  dapagliflozin propanediol (FARXIGA) 10 MG TABS tablet Take 10 mg by mouth daily.    [provider]  gabapentin (NEURONTIN) 300 MG capsule Take 1 capsule by mouth 2 (two) times daily. 11/01/20 11/01/21  [provider]  glipiZIDE (GLUCOTROL XL) 10 MG 24 hr tablet Take 10 mg by mouth 2 (two) times daily. 10/17/19   [provider]  HYDROcodone-acetaminophen (NORCO/VICODIN) 5-325 MG tablet Take 1-2 tablets by mouth 3 (three) times daily as needed for severe pain. 01/03/21   Leroy Sea, MD  levothyroxine (SYNTHROID) 25 MCG tablet Take 25 mcg by mouth daily. 12/05/20   [provider]  losartan (COZAAR) 25 MG tablet Take 25 mg by mouth daily.    [provider]  metFORMIN (GLUCOPHAGE) 1000 MG tablet Take 1,000 mg by mouth 2 (two) times daily with a meal.  08/05/15   [provider]  Multiple Vitamin (ONE-A-DAY MENS PO) Take 1 tablet by mouth daily.    [provider]  mupirocin ointment (BACTROBAN) 2 % Apply 1 application topically in the morning and at bedtime. 12/17/20   [provider]  nirmatrelvir & ritonavir (PAXLOVID) 20 x 150 MG & 10 x 100MG  TBPK Take by mouth. 12/16/20   [provider]  Omega-3 Fatty Acids (OMEGA 3 PO) Take 1,080 mg by mouth in the morning and at bedtime.    [provider]  OZEMPIC, 1 MG/DOSE, 4 MG/3ML SOPN Inject 1 mg into the skin every Sunday.  10/23/19   [provider]  SANTYL ointment  Apply topically. 11/25/20   [provider]  traMADol (ULTRAM) 50 MG tablet Take 50 mg by mouth 3 (three) times daily as needed. 12/05/20   [provider]    Allergies Other  Family History  Problem Relation Age of Onset   Diabetes Mother    Diabetes Sister     Social History Social History   Tobacco Use   Smoking status: Former    Packs/day: 0.25    Years: 4.00    Pack years: 1.00    Types: Cigarettes    Quit date: 2016    Years since quitting: 6.8   Smokeless tobacco: Never  Vaping Use   Vaping Use: Never used  Substance Use Topics   Alcohol use: Not Currently    Alcohol/week: 20.0 standard drinks    Types: 20 Cans of beer per week   Drug use: No    Review of Systems Constitutional: No fever/chills Eyes: No visual changes. Cardiovascular:  Denies chest pain. Respiratory: Denies shortness of breath. Gastrointestinal: No abdominal pain.  No nausea, no vomiting.  No diarrhea.  No constipation. Genitourinary: Negative for dysuria. Musculoskeletal: Right great toe pain, swelling, and leakage from around the toenail. Integumentary: Negative for rash. Neurological: Negative for headaches, focal weakness or numbness.   ____________________________________________   PHYSICAL EXAM:  VITAL SIGNS: ED Triage Vitals  Enc Vitals Group     BP 04/02/21 2101 113/75     Pulse Rate 04/02/21 2101 95     Resp 04/02/21 2101 17     Temp 04/02/21 2101 98.3 F (36.8 C)     Temp Source 04/02/21 2101 Oral     SpO2 04/02/21 2101 98 %     Weight 04/02/21 2101 90.7 kg (200 lb)     Height 04/02/21 2101 1.651 m (5\' 5" )     Head Circumference --      Peak Flow --      Pain Score 04/02/21 2107 9     Pain Loc --      Pain Edu? --      Excl. in GC? --     Constitutional: Alert and oriented.  Eyes: Conjunctivae are normal.  Head: Atraumatic. Nose: No congestion/rhinnorhea. Mouth/Throat: Patient is wearing a mask. Neck: No stridor.  No meningeal signs.    Cardiovascular: Normal rate, regular rhythm. Good peripheral circulation. Respiratory: Normal respiratory effort.  No retractions. Gastrointestinal: Soft and nontender. No distention.  Musculoskeletal: Ambulatory without difficulty.  Right great toe is generally edematous and erythematous but without any drainable fluid collections.  I carefully palpated around the nail, which is, in fact, loose, but there is no evidence of paronychia at this time.  There is no active drainage at this time even with palpation and attempts at expression of fluid.  He has a chronic wound on the plantar surface of his foot that is generally well-appearing and which Dr. Excell Seltzer addressed at the clinic appointment several days ago. Neurologic:  Normal speech and language. No gross focal neurologic deficits are appreciated.  Skin:  Skin is warm, dry and intact except as described above on the musculoskeletal section. Psychiatric: Mood and affect are normal. Speech and behavior are normal.  ____________________________________________   LABS (all labs ordered are listed, but only abnormal results are displayed)  Labs Reviewed  COMPREHENSIVE METABOLIC PANEL - Abnormal; Notable for the following components:      Result Value   Sodium 132 (*)    Glucose, Bld 125 (*)    AST 50 (*)    ALT 52 (*)    All other components within normal limits  CBC WITH DIFFERENTIAL/PLATELET   ____________________________________________  RADIOLOGY Marylou Mccoy, personally viewed and evaluated these images (plain radiographs) as part of my medical decision making, as well as reviewing the written report by the radiologist.  ED MD interpretation: Soft tissue swelling without osteomyelitis  Official radiology report(s): DG Toe Great Right  Result Date: 04/02/2021 CLINICAL DATA:  Right toe pain and swelling, possible infection, initial encounter EXAM: RIGHT GREAT TOE COMPARISON:  12/30/2020 FINDINGS: Considerable swelling of the  first toe is noted. No acute fracture or dislocation is noted. No bony erosive changes are noted to suggest osteomyelitis. IMPRESSION: Soft tissue changes consistent with the given clinical history. No acute bony abnormality is noted. Electronically Signed   By: Alcide Clever M.D.   On: 04/02/2021 21:56    ____________________________________________   INITIAL IMPRESSION / MDM / ASSESSMENT AND PLAN / ED COURSE  As part of my medical decision making, I reviewed the following data within the electronic MEDICAL RECORD NUMBER Nursing notes reviewed and incorporated, Labs reviewed , Old chart reviewed, and Notes from prior ED visits, and reviewed x-rays.   Differential diagnosis includes, but is not limited to, cellulitis, osteomyelitis, diabetic foot ulcer, onychomycosis, nailbed trauma, paronychia, felon.  Vital signs are stable and within normal limits.  CBC is normal, comprehensive metabolic panel is essentially normal other than very slightly elevated AST and ALT which were likely related to his history of alcohol use and which are not clinically relevant at this time.  I personally reviewed the patient's imaging and agree with the radiologist's interpretation that there is no evidence of osteomyelitis currently.  Based on physical exam I am reassured that there is no evidence of paronychia or other abscess that requires drainage.  He may have a mild cellulitis and given his history of extensive problems and wound infections I will start him empirically on the medications as listed below but I strongly encouraged him to follow-up with Dr. Excell Seltzer at the next available opportunity.  No indication for further evaluation or treatment.  He understands and agrees with the plan.         ____________________________________________  FINAL CLINICAL IMPRESSION(S) / ED DIAGNOSES  Final diagnoses:  Cellulitis of toe of right foot     MEDICATIONS GIVEN DURING THIS VISIT:  Medications - No data to  display   ED Discharge Orders          Ordered    cefadroxil (DURICEF) 500 MG capsule  2 times daily        04/03/21 0126    doxycycline (VIBRAMYCIN) 100 MG capsule  2 times daily        04/03/21 0126             Note:  This document was prepared using Dragon voice recognition software and may include unintentional dictation errors.   Loleta Rose, MD 04/03/21 [phone]

## 2021-08-01 ENCOUNTER — Encounter: Payer: Self-pay | Admitting: Emergency Medicine

## 2021-08-01 ENCOUNTER — Other Ambulatory Visit: Payer: Self-pay

## 2021-08-01 ENCOUNTER — Inpatient Hospital Stay
Admission: EM | Admit: 2021-08-01 | Discharge: 2021-08-02 | DRG: 639 | Disposition: A | Discharge status: Home / Self Care | Payer: 59 | Source: Ambulatory Visit | Attending: Internal Medicine | Admitting: Internal Medicine

## 2021-08-01 DIAGNOSIS — R112 Nausea with vomiting, unspecified: Secondary | ICD-10-CM | POA: Diagnosis not present

## 2021-08-01 DIAGNOSIS — Z89422 Acquired absence of other left toe(s): Secondary | ICD-10-CM | POA: Diagnosis not present

## 2021-08-01 DIAGNOSIS — Z7984 Long term (current) use of oral hypoglycemic drugs: Secondary | ICD-10-CM

## 2021-08-01 DIAGNOSIS — E1142 Type 2 diabetes mellitus with diabetic polyneuropathy: Secondary | ICD-10-CM | POA: Diagnosis present

## 2021-08-01 DIAGNOSIS — T383X5A Adverse effect of insulin and oral hypoglycemic [antidiabetic] drugs, initial encounter: Secondary | ICD-10-CM | POA: Diagnosis present

## 2021-08-01 DIAGNOSIS — E860 Dehydration: Secondary | ICD-10-CM | POA: Diagnosis present

## 2021-08-01 DIAGNOSIS — Z7985 Long-term (current) use of injectable non-insulin antidiabetic drugs: Secondary | ICD-10-CM

## 2021-08-01 DIAGNOSIS — E1110 Type 2 diabetes mellitus with ketoacidosis without coma: Principal | ICD-10-CM | POA: Diagnosis present

## 2021-08-01 DIAGNOSIS — Z833 Family history of diabetes mellitus: Secondary | ICD-10-CM

## 2021-08-01 DIAGNOSIS — Z6835 Body mass index (BMI) 35.0-35.9, adult: Secondary | ICD-10-CM

## 2021-08-01 DIAGNOSIS — Z79890 Hormone replacement therapy (postmenopausal): Secondary | ICD-10-CM

## 2021-08-01 DIAGNOSIS — Z87891 Personal history of nicotine dependence: Secondary | ICD-10-CM

## 2021-08-01 DIAGNOSIS — Z89431 Acquired absence of right foot: Secondary | ICD-10-CM

## 2021-08-01 DIAGNOSIS — E669 Obesity, unspecified: Secondary | ICD-10-CM | POA: Diagnosis present

## 2021-08-01 DIAGNOSIS — K920 Hematemesis: Secondary | ICD-10-CM | POA: Diagnosis present

## 2021-08-01 DIAGNOSIS — I10 Essential (primary) hypertension: Secondary | ICD-10-CM | POA: Diagnosis present

## 2021-08-01 LAB — CBC WITH DIFFERENTIAL/PLATELET
Abs Immature Granulocytes: 0.05 10*3/uL (ref 0.00–0.07)
Basophils Absolute: 0.1 10*3/uL (ref 0.0–0.1)
Basophils Relative: 1 %
Eosinophils Absolute: 0 10*3/uL (ref 0.0–0.5)
Eosinophils Relative: 0 %
HCT: 46.8 % (ref 39.0–52.0)
Hemoglobin: 15.7 g/dL (ref 13.0–17.0)
Immature Granulocytes: 1 %
Lymphocytes Relative: 16 %
Lymphs Abs: 1.4 10*3/uL (ref 0.7–4.0)
MCH: 32.5 pg (ref 26.0–34.0)
MCHC: 33.5 g/dL (ref 30.0–36.0)
MCV: 96.9 fL (ref 80.0–100.0)
Monocytes Absolute: 0.9 10*3/uL (ref 0.1–1.0)
Monocytes Relative: 10 %
Neutro Abs: 6.3 10*3/uL (ref 1.7–7.7)
Neutrophils Relative %: 72 %
Platelets: 159 10*3/uL (ref 150–400)
RBC: 4.83 MIL/uL (ref 4.22–5.81)
RDW: 14 % (ref 11.5–15.5)
WBC: 8.7 10*3/uL (ref 4.0–10.5)
nRBC: 0 % (ref 0.0–0.2)

## 2021-08-01 LAB — URINALYSIS, ROUTINE W REFLEX MICROSCOPIC
Bacteria, UA: NONE SEEN
Bilirubin Urine: NEGATIVE
Glucose, UA: >=500 mg/dL — AB
Hgb urine dipstick: NEGATIVE
Ketones, ur: 80 mg/dL — AB
Leukocytes,Ua: NEGATIVE
Nitrite: NEGATIVE
Protein, ur: 30 mg/dL — AB
Specific Gravity, Urine: 1.031 — ABNORMAL HIGH (ref 1.005–1.030)
pH: 6 (ref 5.0–8.0)

## 2021-08-01 LAB — BASIC METABOLIC PANEL
Anion gap: 13 (ref 5–15)
Anion gap: 11 (ref 5–15)
Anion gap: 8 (ref 5–15)
BUN: 15 mg/dL (ref 6–20)
BUN: 16 mg/dL (ref 6–20)
BUN: 14 mg/dL (ref 6–20)
CO2: 21 mmol/L — ABNORMAL LOW (ref 22–32)
CO2: 21 mmol/L — ABNORMAL LOW (ref 22–32)
CO2: 24 mmol/L (ref 22–32)
Calcium: 8.8 mg/dL — ABNORMAL LOW (ref 8.9–10.3)
Calcium: 8.3 mg/dL — ABNORMAL LOW (ref 8.9–10.3)
Calcium: 8.5 mg/dL — ABNORMAL LOW (ref 8.9–10.3)
Chloride: 102 mmol/L (ref 98–111)
Chloride: 100 mmol/L (ref 98–111)
Chloride: 102 mmol/L (ref 98–111)
Creatinine, Ser: 0.75 mg/dL (ref 0.61–1.24)
Creatinine, Ser: 0.74 mg/dL (ref 0.61–1.24)
Creatinine, Ser: 0.71 mg/dL (ref 0.61–1.24)
GFR, Estimated: >60 mL/min (ref >60)
GFR, Estimated: >60 mL/min (ref >60)
GFR, Estimated: >60 mL/min (ref >60)
Glucose, Bld: 78 mg/dL (ref 70–99)
Glucose, Bld: 148 mg/dL — ABNORMAL HIGH (ref 70–99)
Glucose, Bld: 157 mg/dL — ABNORMAL HIGH (ref 70–99)
Potassium: 3.5 mmol/L (ref 3.5–5.1)
Potassium: 3.7 mmol/L (ref 3.5–5.1)
Potassium: 3.7 mmol/L (ref 3.5–5.1)
Sodium: 136 mmol/L (ref 135–145)
Sodium: 132 mmol/L — ABNORMAL LOW (ref 135–145)
Sodium: 134 mmol/L — ABNORMAL LOW (ref 135–145)

## 2021-08-01 LAB — COMPREHENSIVE METABOLIC PANEL
ALT: 49 U/L — ABNORMAL HIGH (ref 0–44)
AST: 37 U/L (ref 15–41)
Albumin: 4.4 g/dL (ref 3.5–5.0)
Alkaline Phosphatase: 89 U/L (ref 38–126)
Anion gap: 20 — ABNORMAL HIGH (ref 5–15)
BUN: 16 mg/dL (ref 6–20)
CO2: 17 mmol/L — ABNORMAL LOW (ref 22–32)
Calcium: 9 mg/dL (ref 8.9–10.3)
Chloride: 95 mmol/L — ABNORMAL LOW (ref 98–111)
Creatinine, Ser: 0.77 mg/dL (ref 0.61–1.24)
GFR, Estimated: >60 mL/min (ref >60)
Glucose, Bld: 279 mg/dL — ABNORMAL HIGH (ref 70–99)
Potassium: 4.1 mmol/L (ref 3.5–5.1)
Sodium: 132 mmol/L — ABNORMAL LOW (ref 135–145)
Total Bilirubin: 1.6 mg/dL — ABNORMAL HIGH (ref 0.3–1.2)
Total Protein: 8.2 g/dL — ABNORMAL HIGH (ref 6.5–8.1)

## 2021-08-01 LAB — GLUCOSE, CAPILLARY
Glucose-Capillary: 84 mg/dL (ref 70–99)
Glucose-Capillary: 89 mg/dL (ref 70–99)
Glucose-Capillary: 128 mg/dL — ABNORMAL HIGH (ref 70–99)
Glucose-Capillary: 136 mg/dL — ABNORMAL HIGH (ref 70–99)
Glucose-Capillary: 172 mg/dL — ABNORMAL HIGH (ref 70–99)
Glucose-Capillary: 170 mg/dL — ABNORMAL HIGH (ref 70–99)
Glucose-Capillary: 177 mg/dL — ABNORMAL HIGH (ref 70–99)
Glucose-Capillary: 152 mg/dL — ABNORMAL HIGH (ref 70–99)

## 2021-08-01 LAB — LIPASE, BLOOD: Lipase: 57 U/L — ABNORMAL HIGH (ref 11–51)

## 2021-08-01 LAB — BETA-HYDROXYBUTYRIC ACID
Beta-Hydroxybutyric Acid: 7.56 mmol/L — ABNORMAL HIGH (ref 0.05–0.27)
Beta-Hydroxybutyric Acid: 4.19 mmol/L — ABNORMAL HIGH (ref 0.05–0.27)
Beta-Hydroxybutyric Acid: 2.82 mmol/L — ABNORMAL HIGH (ref 0.05–0.27)

## 2021-08-01 LAB — CBG MONITORING, ED: Glucose-Capillary: 294 mg/dL — ABNORMAL HIGH (ref 70–99)

## 2021-08-01 LAB — MRSA NEXT GEN BY PCR, NASAL: MRSA by PCR Next Gen: NOT DETECTED

## 2021-08-01 LAB — HEMOGLOBIN AND HEMATOCRIT, BLOOD
HCT: 43.5 % (ref 39.0–52.0)
Hemoglobin: 14.7 g/dL (ref 13.0–17.0)

## 2021-08-01 MED ORDER — POTASSIUM CHLORIDE 10 MEQ/100ML IV SOLN
10.0000 meq | INTRAVENOUS | Status: AC
  Administered 2021-08-01 (×2): 10 meq via INTRAVENOUS
  Filled 2021-08-01 (×2): qty 100

## 2021-08-01 MED ORDER — DEXTROSE 50 % IV SOLN: 0.0000 mL | INTRAVENOUS | Status: DC | PRN

## 2021-08-01 MED ORDER — LACTATED RINGERS IV BOLUS
1000.0000 mL | Freq: Once | INTRAVENOUS | Status: AC
  Administered 2021-08-01: 1000 mL via INTRAVENOUS

## 2021-08-01 MED ORDER — DEXTROSE IN LACTATED RINGERS 5 % IV SOLN: INTRAVENOUS | Status: DC

## 2021-08-01 MED ORDER — LACTATED RINGERS IV SOLN: INTRAVENOUS | Status: DC

## 2021-08-01 MED ORDER — POTASSIUM CHLORIDE 10 MEQ/100ML IV SOLN
10.0000 meq | INTRAVENOUS | Status: AC
  Administered 2021-08-01 (×2): 10 meq via INTRAVENOUS
  Filled 2021-08-01: qty 100

## 2021-08-01 MED ORDER — PANTOPRAZOLE SODIUM 40 MG IV SOLR
40.0000 mg | INTRAVENOUS | Status: DC
Start: 2021-08-01 — End: 2021-08-02
  Administered 2021-08-01: 40 mg via INTRAVENOUS
  Filled 2021-08-01: qty 10

## 2021-08-01 MED ORDER — CHLORHEXIDINE GLUCONATE CLOTH 2 % EX PADS
6.0000 | MEDICATED_PAD | Freq: Every day | CUTANEOUS | Status: DC
  Administered 2021-08-02: 6 via TOPICAL

## 2021-08-01 MED ORDER — SODIUM CHLORIDE 0.9 % IV BOLUS
1000.0000 mL | Freq: Once | INTRAVENOUS | Status: AC
  Administered 2021-08-01: 1000 mL via INTRAVENOUS

## 2021-08-01 MED ORDER — ONDANSETRON HCL 4 MG/2ML IJ SOLN
4.0000 mg | Freq: Once | INTRAMUSCULAR | Status: AC
  Administered 2021-08-01: 4 mg via INTRAVENOUS
  Filled 2021-08-01: qty 2

## 2021-08-01 MED ORDER — ENOXAPARIN SODIUM 40 MG/0.4ML IJ SOSY
40.0000 mg | PREFILLED_SYRINGE | INTRAMUSCULAR | Status: DC
  Administered 2021-08-01: 40 mg via SUBCUTANEOUS
  Filled 2021-08-01: qty 0.4

## 2021-08-01 MED ORDER — INSULIN REGULAR(HUMAN) IN NACL 100-0.9 UT/100ML-% IV SOLN
INTRAVENOUS | Status: DC
  Administered 2021-08-01: 17 [IU]/h via INTRAVENOUS
  Administered 2021-08-02: 2.6 [IU]/h via INTRAVENOUS
  Filled 2021-08-01 (×2): qty 100

## 2021-08-01 NOTE — Assessment & Plan Note (Addendum)
Patient most likely has euglycemic DKA related to Ozempic with [place]  ?A1c of 10.2 ?Have held [place] given presenting DKA ?DKA resolved with IVF hydration and insulin gtt ?Have transitioned to subq insulin with lantus 25 units and lipro 8 units before meals ?Would gradually titrate insulin with goal of euglycemia ?Per Diabetic Coordinator, pt advised to f/u with Dr. Otha Carina on d/c ?

## 2021-08-01 NOTE — Progress Notes (Signed)
Admission profile updated. ?

## 2021-08-01 NOTE — ED Notes (Signed)
Informed RN bed assigned 

## 2021-08-01 NOTE — H&P (Signed)
?History and Physical  ? ? ?Patient: Brendan Pruitt SVX:[id number] DOB: Jun 02, 1975 ?DOA: 08/01/2021 ?DOS: the patient was seen and examined on 08/01/2021 ?PCP: Barbette Reichmann, MD  ?Patient coming from: Home ? ?Chief Complaint:  ?Chief Complaint  ?Patient presents with  ? Nausea  ? Emesis  ? Hyperglycemia  ? ?HPI: Brendan Pruitt is a 46 y.o. male with medical history significant for diabetes mellitus, morbid obesity, hypertension who presents to the emergency room from [facility] for evaluation of a 2-day history of nausea and vomiting.   ?Patient states that he checked his sugar 2 days ago and his monitor read greater than 600 so he gave himself 2 shots of Ozempic 2 mg one on each side of his anterior abdominal wall.  He normally injects 1 mg of Ozempic weekly.  He states that the next day he developed nausea and had several episodes of emesis.  He has been unable to tolerate any oral intake and on the day of admission he had several episodes of hematemesis that he describes as emesis containing bright red blood.  He denies having any abdominal pain.  He denies any NSAID use and denies alcohol use. ?He denies having any change in his bowel habits, no urinary symptoms, no fever, no chills, no leg swelling, no headache, no blurred vision or any focal deficit. ?His labs in the emergency room show an anion gap metabolic acidosis with elevated beta hydroxybutyric acid. ?He has been started on an insulin drip and will be admitted to the hospital for further evaluation. ?Review of Systems: As mentioned in the history of present illness. All other systems reviewed and are negative. ?Past Medical History:  ?Diagnosis Date  ? Acute osteomyelitis (HCC) 10/2019  ? phalanx left foot  ? Cellulitis and abscess of foot 10/2019  ? left.  using betadine wet to dry dressings.  treated with multiple antibiotics. + pseudomonas  ? Diabetes mellitus without complication (HCC)   ? Elevated hemoglobin A1c 10/2019  ? results are 12.5%   ? Gangrene (HCC) 10/2019  ? left toe  ? Hypertension   ? Neuromuscular disorder (HCC)   ? diabetic neuropathy  ? ?Past Surgical History:  ?Procedure Laterality Date  ? AMPUTATION Right 12/30/2020  ? Procedure: Partial 5th Ray AMPUTATION;  Surgeon: Rosetta Posner, DPM;  Location: [facility];  Service: Podiatry;  Laterality: Right;  ? AMPUTATION TOE Left 11/06/2019  ? Procedure: AMPUTATION TOE IPJ LEFT HALLUX;  Surgeon: Rosetta Posner, DPM;  Location: [facility];  Service: Podiatry;  Laterality: Left;  ? CATARACT EXTRACTION W/PHACO Right 09/21/2020  ? Procedure: CATARACT EXTRACTION PHACO AND INTRAOCULAR LENS PLACEMENT (IOC) RIGHT DIABETIC;  Surgeon: Lockie Mola, MD;  Location: [facility];  Service: Ophthalmology;  Laterality: Right;  Diabetic - oral meds ?cde 9.48 ?01:48.8 minutes ?8.7%  ? CATARACT EXTRACTION W/PHACO Left 10/12/2020  ? Procedure: CATARACT EXTRACTION PHACO AND INTRAOCULAR LENS PLACEMENT (IOC) LEFT DIABETIC 8.16 01:37.0;  Surgeon: Lockie Mola, MD;  Location: [facility];  Service: Ophthalmology;  Laterality: Left;  Diabetic - oral meds  ? I & D EXTREMITY Right 11/14/2020  ? Procedure: IRRIGATION AND DEBRIDEMENT EXTREMITY;  Surgeon: Linus Galas, DPM;  Location: [facility];  Service: Podiatry;  Laterality: Right;  ? LUMBAR DISC SURGERY  2010  ? no metal  ? UMBILICAL HERNIA REPAIR  2010  ? mesh   ? ?Social History:  reports that he quit smoking about 7 years ago. His smoking use included cigarettes. He has a 1.00 pack-year smoking history. He  has never used smokeless tobacco. He reports that he does not currently use alcohol after a past usage of about 20.0 standard drinks per week. He reports that he does not use drugs. ? ?Allergies  ?Allergen Reactions  ? Other Itching  ?  Manson PasseyBrown Paper towels  ? ? ?Family History  ?Problem Relation Age of Onset  ? Diabetes Mother   ? Diabetes Sister   ? ? ?Prior to Admission medications   ?Medication Sig Start Date End Date Taking? Authorizing  Provider  ?acetaminophen (TYLENOL) 500 MG tablet Take 500 mg by mouth every 6 (six) hours as needed for mild pain, moderate pain or headache.    [provider]  ?dapagliflozin propanediol (FARXIGA) 10 MG TABS tablet Take 10 mg by mouth daily.    [provider]  ?gabapentin (NEURONTIN) 300 MG capsule Take 300 mg by mouth 2 (two) times daily.    [provider]  ?glipiZIDE (GLUCOTROL XL) 10 MG 24 hr tablet Take 10 mg by mouth 2 (two) times daily. 10/17/19   [provider]  ?levothyroxine (SYNTHROID) 25 MCG tablet Take 25 mcg by mouth daily. 12/05/20   [provider]  ?losartan (COZAAR) 25 MG tablet Take 25 mg by mouth daily.    [provider]  ?Multiple Vitamin (ONE-A-DAY MENS PO) Take 1 tablet by mouth daily.    [provider]  ?Omega-3 Fatty Acids (OMEGA 3 PO) Take 1,080 mg by mouth in the morning and at bedtime.    [provider]  ?Semaglutide, 1 MG/DOSE, 4 MG/3ML SOPN Inject 2 mg into the skin every Sunday.    [provider]  ? ? ?Physical Exam: ?Vitals:  ? 08/01/21 0840 08/01/21 0843 08/01/21 1030  ?BP: (!) 151/116  114/69  ?Pulse: (!) 112  100  ?Resp: 20  18  ?Temp: 99 ?F (37.2 ?C)    ?TempSrc: Oral    ?SpO2: 96%  95%  ?Weight:  95.7 kg   ?Height:  5\' 5"  (1.651 m)   ? ?Physical Exam ?Constitutional:   ?   Appearance: He is obese.  ?HENT:  ?   Head: Normocephalic and atraumatic.  ?   Nose: Nose normal.  ?   Mouth/Throat:  ?   Mouth: Mucous membranes are moist.  ?Eyes:  ?   Pupils: Pupils are equal, round, and reactive to light.  ?Cardiovascular:  ?   Rate and Rhythm: Tachycardia present.  ?   Heart sounds: Normal heart sounds.  ?Pulmonary:  ?   Effort: Pulmonary effort is normal.  ?   Breath sounds: Normal breath sounds.  ?Abdominal:  ?   General: Bowel sounds are normal.  ?   Palpations: Abdomen is soft.  ?   Comments: Central adiposity  ?Musculoskeletal:     ?   General: Normal range of motion.  ?   Cervical back: Normal range  of motion and neck supple.  ?Skin: ?   General: Skin is warm and dry.  ?Neurological:  ?   General: No focal deficit present.  ?   Mental Status: He is alert and oriented to person, place, and time.  ?Psychiatric:     ?   Mood and Affect: Mood normal.     ?   Behavior: Behavior normal.  ? ? ?Data Reviewed: ?Relevant notes from primary care and specialist visits, past discharge summaries as available in EHR, including Care Everywhere. ?Prior diagnostic testing as pertinent to current admission diagnoses ?Updated medications and problem lists for reconciliation ?  ED course, including vitals, labs, imaging, treatment and response to treatment ?Triage notes, nursing and pharmacy notes and ED provider's notes ?Notable results as noted in HPI ?Labs reviewed and essentially negative except for beta hydroxybutyric acid of 7.56, sodium 132, potassium 4.1, chloride 95, bicarb 17, BUN 16, glucose 279, creatinine 0.7, total protein 8.2, AST 37, ALT 49, anion gap 20, lipase 57 ?There are no new results to review at this time.  ? ?Assessment and Plan: ?* DKA (diabetic ketoacidosis) (HCC) ?Patient most likely has euglycemic DKA related to Ozempic use ?Hold Ozempic ?Aggressive IV fluid resuscitation ?Continue insulin drip initiated in the ER ?Check and supplement electrolytes ?Supportive care with antiemetics ? ?Hematemesis ?Patient with complaints of several episodes of unwitnessed hematemesis  ?Initial hemoglobin is stable at 15.7g/dl ?Start patient on Protonix 40 mg IV daily ?We will trend H&H  ?We will consult GI if patient has a drop in his H&H or any further episodes of hematemesis ? ?HTN (hypertension) ?Hold antihypertensive medications for now since patient is normotensive ? ? ? ? ? Advance Care Planning:   Code Status: Full Code  ? ?Consults: None ? ?Family Communication: Greater than 50% of time was spent discussing patient's condition and plan of care with him at the bedside.  All questions and concerns have been  addressed.  He verbalizes understanding and agrees with the plan. ? ?Severity of Illness: ?The appropriate patient status for this patient is INPATIENT. Inpatient status is judged to be reasonable and necessary in

## 2021-08-01 NOTE — ED Provider Notes (Signed)
? ?[facility] ?Provider Note ? ? ? Event Date/Time  ? First MD Initiated Contact with Patient 08/01/21 0848   ?  (approximate) ? ? ?History  ? ?Nausea, Emesis, and Hyperglycemia ? ? ?HPI ? ?Brendan Pruitt is a 46 y.o. male with a past history of type 2 diabetes with polyneuropathy who comes the ED complaining of dizziness, nausea and vomiting for the past few days.  Reports her blood sugar was reading high at home.  He notes that he has been having unusual symptoms since his doctor started transitioning him to [place] a few weeks ago.  They have been having him change between taking 1 mg or 2 mg doses.  He notes that after 2 mg dose he gets really dizzy and feels sick.  Just prior to onset of symptoms 2 days ago, he used a 2 mg dose as instructed by his doctor. ?He does endorse polyuria.  No dysuria.  No fever.  He has had poor oral intake due to vomiting. ?  ? ? ?Physical Exam  ? ?Triage Vital Signs: ?ED Triage Vitals  ?Enc Vitals Group  ?   BP 08/01/21 0840 (!) 151/116  ?   Pulse Rate 08/01/21 0840 (!) 112  ?   Resp 08/01/21 0840 20  ?   Temp 08/01/21 0840 99 ?F (37.2 ?C)  ?   Temp Source 08/01/21 0840 Oral  ?   SpO2 08/01/21 0840 96 %  ?   Weight 08/01/21 0843 211 lb (95.7 kg)  ?   Height 08/01/21 0843 5\' 5"  (1.651 m)  ?   Head Circumference --   ?   Peak Flow --   ?   Pain Score 08/01/21 0843 0  ?   Pain Loc --   ?   Pain Edu? --   ?   Excl. in [place]? --   ? ? ?Most recent vital signs: ?Vitals:  ? 08/01/21 0840 08/01/21 1030  ?BP: (!) 151/116 114/69  ?Pulse: (!) 112 100  ?Resp: 20 18  ?Temp: 99 ?F (37.2 ?C)   ?SpO2: 96% 95%  ? ? ? ?General: Awake, no distress.  ?CV:  Good peripheral perfusion.  Tachycardia, heart rate 110 ?Resp:  Normal effort.  Clear to auscultation bilaterally ?Abd:  No distention.  Soft nontender ?Other:  No lower extremity edema or rash.  Dry mucous membranes. ? ? ?ED Results / Procedures / Treatments  ? ?Labs ?(all labs ordered are listed, but only abnormal results are  displayed) ?Labs Reviewed  ?COMPREHENSIVE METABOLIC PANEL - Abnormal; Notable for the following components:  ?    Result Value  ? Sodium 132 (*)   ? Chloride 95 (*)   ? CO2 17 (*)   ? Glucose, Bld 279 (*)   ? Total Protein 8.2 (*)   ? ALT 49 (*)   ? Total Bilirubin 1.6 (*)   ? Anion gap 20 (*)   ? All other components within normal limits  ?LIPASE, BLOOD - Abnormal; Notable for the following components:  ? Lipase 57 (*)   ? All other components within normal limits  ?BETA-HYDROXYBUTYRIC ACID - Abnormal; Notable for the following components:  ? Beta-Hydroxybutyric Acid 7.56 (*)   ? All other components within normal limits  ?CBG MONITORING, ED - Abnormal; Notable for the following components:  ? Glucose-Capillary 294 (*)   ? All other components within normal limits  ?CBC WITH DIFFERENTIAL/PLATELET  ?URINALYSIS, ROUTINE W REFLEX MICROSCOPIC  ? ? ? ?EKG ? ? ? ? ?  RADIOLOGY ? ? ? ? ?PROCEDURES: ? ?Critical Care performed: Yes, see critical care procedure note(s) ? ?.Critical Care ?Performed by: Carrie Mew, MD ?Authorized by: Carrie Mew, MD  ? ?Critical care provider statement:  ?  Critical care time (minutes):  35 ?  Critical care time was exclusive of:  Separately billable procedures and treating other patients ?  Critical care was necessary to treat or prevent imminent or life-threatening deterioration of the following conditions:  Endocrine crisis, metabolic crisis and dehydration ?  Critical care was time spent personally by me on the following activities:  Development of treatment plan with patient or surrogate, discussions with consultants, evaluation of patient's response to treatment, examination of patient, obtaining history from patient or surrogate, ordering and performing treatments and interventions, ordering and review of laboratory studies, ordering and review of radiographic studies, pulse oximetry, re-evaluation of patient's condition and review of old charts ? ? ?MEDICATIONS ORDERED IN  ED: ?Medications  ?insulin regular, human (MYXREDLIN) 100 units/ 100 mL infusion (has no administration in time range)  ?lactated ringers infusion (has no administration in time range)  ?dextrose 5 % in lactated ringers infusion (has no administration in time range)  ?dextrose 50 % solution 0-50 mL (has no administration in time range)  ?potassium chloride 10 mEq in 100 mL IVPB (has no administration in time range)  ?lactated ringers bolus 1,000 mL (has no administration in time range)  ?sodium chloride 0.9 % bolus 1,000 mL (1,000 mLs Intravenous New Bag/Given 08/01/21 0918)  ?ondansetron [facility]) injection 4 mg (4 mg Intravenous Given 08/01/21 0918)  ? ? ? ?IMPRESSION / MDM / ASSESSMENT AND PLAN / ED COURSE  ?I reviewed the triage vital signs and the nursing notes. ?             ?               ? ?Differential diagnosis includes, but is not limited to, dehydration, electrolyte abnormality, DKA, Ozempic side effects ? ? ? ?Patient comes ED with nausea vomiting and dizziness, appears somewhat dehydrated but not in distress.  Will check labs, give IV fluid bolus and Zofran. ? ?Clinical Course as of 08/01/21 1233  ?Tue Aug 01, 2021  ?1230 Labs show DKA.  Blood sugar is falling to 180s so he will need D5 infusion along with insulin infusion until gap is corrected and symptoms are resolved.  Will contact hospitalist. [PS]  ?  ?Clinical Course User Index ?[PS] Carrie Mew, MD  ? ? ? ?FINAL CLINICAL IMPRESSION(S) / ED DIAGNOSES  ? ?Final diagnoses:  ?Type 2 diabetes mellitus with ketoacidosis without coma, without long-term current use of insulin ([place])  ?Nausea and vomiting, unspecified vomiting type  ?Dehydration  ? ? ? ?Rx / DC Orders  ? ?ED Discharge Orders   ? ? None  ? ?  ? ? ? ?Note:  This document was prepared using Dragon voice recognition software and may include unintentional dictation errors. ?  ?Carrie Mew, MD ?08/01/21 1233 ? ?

## 2021-08-01 NOTE — ED Triage Notes (Addendum)
Pt to ED via POV from [facility] with c/o N/V for the last two days. He reports that his machine at home is reading high. He just was started on Ozempic and began to get sick after taking it.  ?

## 2021-08-01 NOTE — Assessment & Plan Note (Addendum)
Patient with complaints of several episodes of unwitnessed hematemesis  ?Initial hemoglobin is stable at 15.7g/dl ?Hgb trends remained stable. No further concerns of blood loss ?

## 2021-08-01 NOTE — ED Notes (Signed)
Pt NAD in bed, a/ox4. Pt c/o n/v since yesterday morning with "lots of blood in it". Pt denies ABD pain, diarrhea or fever. Pt also denies ETOH use or NSAIDS/blood thinner use. Pt asks what it means when his BGL is 600. Pt states he is usually in the 250s Bgl in the morning ? ?

## 2021-08-01 NOTE — Assessment & Plan Note (Addendum)
Would cont home meds on d/c ?Cr noted to be normal at time of d/c ?

## 2021-08-02 DIAGNOSIS — E860 Dehydration: Secondary | ICD-10-CM | POA: Diagnosis not present

## 2021-08-02 DIAGNOSIS — R112 Nausea with vomiting, unspecified: Secondary | ICD-10-CM

## 2021-08-02 LAB — HEMOGLOBIN AND HEMATOCRIT, BLOOD
HCT: 42.7 % (ref 39.0–52.0)
HCT: 46.4 % (ref 39.0–52.0)
Hemoglobin: 14.8 g/dL (ref 13.0–17.0)
Hemoglobin: 15.7 g/dL (ref 13.0–17.0)

## 2021-08-02 LAB — BASIC METABOLIC PANEL
Anion gap: 8 (ref 5–15)
Anion gap: 8 (ref 5–15)
BUN: 14 mg/dL (ref 6–20)
BUN: 13 mg/dL (ref 6–20)
CO2: 24 mmol/L (ref 22–32)
CO2: 25 mmol/L (ref 22–32)
Calcium: 8.5 mg/dL — ABNORMAL LOW (ref 8.9–10.3)
Calcium: 8.5 mg/dL — ABNORMAL LOW (ref 8.9–10.3)
Chloride: 102 mmol/L (ref 98–111)
Chloride: 102 mmol/L (ref 98–111)
Creatinine, Ser: 0.7 mg/dL (ref 0.61–1.24)
Creatinine, Ser: 0.64 mg/dL (ref 0.61–1.24)
GFR, Estimated: >60 mL/min (ref >60)
GFR, Estimated: >60 mL/min (ref >60)
Glucose, Bld: 166 mg/dL — ABNORMAL HIGH (ref 70–99)
Glucose, Bld: 155 mg/dL — ABNORMAL HIGH (ref 70–99)
Potassium: 3.5 mmol/L (ref 3.5–5.1)
Potassium: 3.3 mmol/L — ABNORMAL LOW (ref 3.5–5.1)
Sodium: 134 mmol/L — ABNORMAL LOW (ref 135–145)
Sodium: 135 mmol/L (ref 135–145)

## 2021-08-02 LAB — GLUCOSE, CAPILLARY
Glucose-Capillary: 133 mg/dL — ABNORMAL HIGH (ref 70–99)
Glucose-Capillary: 141 mg/dL — ABNORMAL HIGH (ref 70–99)
Glucose-Capillary: 147 mg/dL — ABNORMAL HIGH (ref 70–99)
Glucose-Capillary: 151 mg/dL — ABNORMAL HIGH (ref 70–99)
Glucose-Capillary: 158 mg/dL — ABNORMAL HIGH (ref 70–99)
Glucose-Capillary: 159 mg/dL — ABNORMAL HIGH (ref 70–99)
Glucose-Capillary: 160 mg/dL — ABNORMAL HIGH (ref 70–99)
Glucose-Capillary: 182 mg/dL — ABNORMAL HIGH (ref 70–99)
Glucose-Capillary: 189 mg/dL — ABNORMAL HIGH (ref 70–99)
Glucose-Capillary: 219 mg/dL — ABNORMAL HIGH (ref 70–99)

## 2021-08-02 LAB — BETA-HYDROXYBUTYRIC ACID: Beta-Hydroxybutyric Acid: 1.54 mmol/L — ABNORMAL HIGH (ref 0.05–0.27)

## 2021-08-02 LAB — HEMOGLOBIN A1C
Hgb A1c MFr Bld: 10.2 % — ABNORMAL HIGH (ref 4.8–5.6)
Mean Plasma Glucose: 246 mg/dL

## 2021-08-02 MED ORDER — INSULIN ASPART 100 UNIT/ML IJ SOLN: 0.0000 [IU] | Freq: Every day | INTRAMUSCULAR | Status: DC

## 2021-08-02 MED ORDER — INSULIN ASPART 100 UNIT/ML IJ SOLN
0.0000 [IU] | Freq: Three times a day (TID) | INTRAMUSCULAR | Status: DC
  Administered 2021-08-02: 5 [IU] via SUBCUTANEOUS

## 2021-08-02 MED ORDER — INSULIN LISPRO (1 UNIT DIAL) 100 UNIT/ML (KWIKPEN): 8.0000 [IU] | PEN_INJECTOR | Freq: Three times a day (TID) | SUBCUTANEOUS | 0 refills | Status: AC

## 2021-08-02 MED ORDER — INSULIN GLARGINE 100 UNIT/ML SOLOSTAR PEN: 25.0000 [IU] | PEN_INJECTOR | Freq: Every day | SUBCUTANEOUS | 0 refills | Status: AC

## 2021-08-02 MED ORDER — ATORVASTATIN CALCIUM 20 MG PO TABS
20.0000 mg | ORAL_TABLET | Freq: Every day | ORAL | Status: DC
  Administered 2021-08-02: 20 mg via ORAL
  Filled 2021-08-02: qty 1

## 2021-08-02 MED ORDER — PANTOPRAZOLE SODIUM 40 MG PO TBEC
40.0000 mg | DELAYED_RELEASE_TABLET | Freq: Every day | ORAL | Status: DC
Start: 2021-08-02 — End: 2021-08-02
  Administered 2021-08-02: 40 mg via ORAL
  Filled 2021-08-02: qty 1

## 2021-08-02 MED ORDER — INSULIN GLARGINE-YFGN 100 UNIT/ML ~~LOC~~ SOLN
20.0000 [IU] | Freq: Every day | SUBCUTANEOUS | Status: DC
  Administered 2021-08-02: 20 [IU] via SUBCUTANEOUS
  Filled 2021-08-02: qty 0.2

## 2021-08-02 MED ORDER — POTASSIUM CHLORIDE CRYS ER 20 MEQ PO TBCR
60.0000 meq | EXTENDED_RELEASE_TABLET | Freq: Once | ORAL | Status: AC
  Administered 2021-08-02: 60 meq via ORAL
  Filled 2021-08-02: qty 3

## 2021-08-02 MED ORDER — LOSARTAN POTASSIUM 50 MG PO TABS
25.0000 mg | ORAL_TABLET | Freq: Every day | ORAL | Status: DC
  Administered 2021-08-02: 25 mg via ORAL
  Filled 2021-08-02: qty 1

## 2021-08-02 MED ORDER — MUPIROCIN 2 % EX OINT
TOPICAL_OINTMENT | Freq: Two times a day (BID) | CUTANEOUS | Status: DC
  Filled 2021-08-02: qty 22

## 2021-08-02 MED ORDER — INSULIN PEN NEEDLE 29G X 5MM MISC: 1.0000 | Freq: Four times a day (QID) | 0 refills | Status: AC

## 2021-08-02 MED ORDER — INSULIN ASPART 100 UNIT/ML IJ SOLN
8.0000 [IU] | Freq: Three times a day (TID) | INTRAMUSCULAR | Status: DC
  Administered 2021-08-02: 8 [IU] via SUBCUTANEOUS
  Filled 2021-08-02: qty 1

## 2021-08-02 MED ORDER — INSULIN LISPRO (1 UNIT DIAL) 100 UNIT/ML (KWIKPEN): PEN_INJECTOR | SUBCUTANEOUS | 11 refills | Status: AC

## 2021-08-02 MED ORDER — LEVOTHYROXINE SODIUM 50 MCG PO TABS
25.0000 ug | ORAL_TABLET | Freq: Every day | ORAL | Status: DC
  Administered 2021-08-02: 25 ug via ORAL
  Filled 2021-08-02: qty 1

## 2021-08-02 MED ORDER — GABAPENTIN 300 MG PO CAPS
300.0000 mg | ORAL_CAPSULE | Freq: Two times a day (BID) | ORAL | Status: DC
  Administered 2021-08-02: 300 mg via ORAL
  Filled 2021-08-02: qty 1

## 2021-08-02 NOTE — Progress Notes (Addendum)
Inpatient Diabetes Program Recommendations ? ?AACE/ADA: New Consensus Statement on Inpatient Glycemic Control (2015) ? ?Target Ranges:  Prepandial:   less than 140 mg/dL ?     Peak postprandial:   less than 180 mg/dL (1-2 hours) ?     Critically ill patients:  140 - 180 mg/dL  ? ?Lab Results  ?Component Value Date  ? GLUCAP 147 (H) 08/02/2021  ? HGBA1C 10.2 (H) 08/01/2021  ? ? ?Review of Glycemic Control ? ?Diabetes history: DM  ?Outpatient Diabetes medications: Farxiga 10 mg Daily, Glipizide 10 mg bid, Ozempic 1 mg weekly ?Current orders for Inpatient glycemic control:  ?Semglee 20 units Daily ?Novolog 0-15 units tid + hs ?Novolog 8 units tid meal coverage ? ?A1c 10.2% on 3/21 ? ?Inpatient Diabetes Program Recommendations:   ? ?Gave himself 2 shots of Ozempic with hyperglycemia ?Will need education regarding Ozempic and that it is not an insulin ? ?Addendum 1136 am: ? ?Spoke with pt at bedside and discussed the reason behind his DKA. Informed pt he will need insulin at time of d/c and he will follow up with his Endo for further instructions on his [place] and Ozempic. Discussed in detail regarding the different types of insulin. Discussed basal insulin and meal coverage insulin when to take and how. Reviewed how to use the insulin pen (pt injects Ozempic, which is similar) discussed how to store insulin. Encouraged pt to check his glucose levels. Also encouraged follow up. Pt reports following his diet at home. Discussed glucose and A1c goals and importance of glucose control. ? ?Thanks, ? ?Christena Deem RN, MSN, BC-ADM ?Inpatient Diabetes Coordinator ?Team Pager [phone] (8a-5p) ? ?

## 2021-08-02 NOTE — Discharge Summary (Signed)
?Physician Discharge Summary ?  ?Patient: Brendan BaliGabriel Pruitt MRN: [id number] DOB: 23-Apr-1976  ?Admit date:     08/01/2021  ?Discharge date: 08/02/21  ?Discharge Physician: Rickey BarbaraStephen Dessire Grimes  ? ?PCP: Barbette ReichmannHande, Vishwanath, MD  ? ?Recommendations at discharge:  ? ? Follow up with PCP in 1-2 weeks ?Recommend follow up with Endocrinology as scheduled ? ?Discharge Diagnoses: ?Principal Problem: ?  DKA (diabetic ketoacidosis) (HCC) ?Active Problems: ?  HTN (hypertension) ?  Hematemesis ? ?Resolved Problems: ?  * No resolved hospital problems. * ? ?Hospital Course: ?46 y.o. male with medical history significant for diabetes mellitus, morbid obesity, hypertension who [facility] the emergency room [facility] for evaluation of a 2-day history of nausea and vomiting.   ?Patient states that he checked his sugar 2 days ago and his monitor read greater than 600 so he gave himself 2 shots of Ozempic 2 mg one on each side of his anterior abdominal wall.  He normally injects 1 mg of Ozempic weekly.  He states that the next day he developed nausea and had several episodes of emesis.  He has been unable to tolerate any oral intake and on the day of admission he had several episodes of hematemesis that he describes as emesis containing bright red blood.  He denies having any abdominal pain.  He denies any NSAID use and denies alcohol use. ?He denies having any change in his bowel habits, no urinary symptoms, no fever, no chills, no leg swelling, no headache, no blurred vision or any focal deficit. ?His labs in the emergency room show an anion gap metabolic acidosis with elevated beta hydroxybutyric acid. ?He was started on an insulin drip and IVF and admitted for further work up ? ?Assessment and Plan: ?* DKA (diabetic ketoacidosis) (HCC) ?Patient most likely has euglycemic DKA related to Ozempic with [place]  ?A1c of 10.2 ?Have held Ozempic and farxiga given presenting DKA ?DKA resolved with IVF hydration and insulin gtt ?Have  transitioned to subq insulin with lantus 25 units and lipro 8 units before meals ?Would gradually titrate insulin with goal of euglycemia ?Per Diabetic Coordinator, pt advised to f/u with Dr. Tedd SiasSolum on d/c ? ?Hematemesis ?Patient with complaints of several episodes of unwitnessed hematemesis  ?Initial hemoglobin is stable at 15.7g/dl ?Hgb trends remained stable. No further concerns of blood loss ? ?HTN (hypertension) ?Would cont home meds on d/c ?Cr noted to be normal at time of d/c ? ? ?  ? ? ?Consultants:  ?Procedures performed:   ?Disposition: Home ?Diet recommendation:  ?Carb modified diet ?DISCHARGE MEDICATION: ?Allergies as of 08/02/2021   ? ?   Reactions  ? Other Itching  ? Manson PasseyBrown Paper towels  ? ?  ? ?  ?Medication List  ?  ? ?STOP taking these medications   ? ?dapagliflozin propanediol 10 MG Tabs tablet ?Commonly known as: FARXIGA ?  ?glipiZIDE 10 MG 24 hr tablet ?Commonly known as: GLUCOTROL XL ?  ?Semaglutide (1 MG/DOSE) 4 MG/3ML Sopn ?  ? ?  ? ?TAKE these medications   ? ?acetaminophen 500 MG tablet ?Commonly known as: TYLENOL ?Take 500-1,000 mg by mouth every 6 (six) hours as needed for mild pain, moderate pain or headache. ?  ?atorvastatin 20 MG tablet ?Commonly known as: LIPITOR ?Take 20 mg by mouth daily. ?  ?gabapentin 300 MG capsule ?Commonly known as: NEURONTIN ?Take 300 mg by mouth 2 (two) times daily. ?  ?insulin glargine 100 UNIT/ML Solostar Pen ?Commonly known as: LANTUS ?Inject 25 Units into the skin  daily. ?  ?insulin lispro 100 UNIT/ML KwikPen ?Commonly known as: Psychologist, clinical ?Inject 8 Units into the skin 3 (three) times daily before meals. ?  ?insulin lispro 100 UNIT/ML KwikPen ?Commonly known as: Psychologist, clinical ?Sliding Scale: ?CBG 121 - 150: 2 units  ?CBG 151 - 200: 3 units  ?CBG 201 - 250: 5 units  ?CBG 251 - 300: 8 units  ?CBG 301 - 350: 11 units  ?CBG 351 - 400: 15 units  ?CBG > 400 call your MD ?  ?Insulin Pen Needle 29G X Misc ?1 Device by Does not apply route QID. For use  with insulin pens ?  ?levothyroxine 25 MCG tablet ?Commonly known as: SYNTHROID ?Take 25 mcg by mouth daily. ?  ?losartan 25 MG tablet ?Commonly known as: COZAAR ?Take 25 mg by mouth daily. ?  ? ?  ? ? Follow-up Information   ? ? Barbette Reichmann, MD Follow up in 2 week(s).   ?Specialty: Internal Medicine ?Why: Hospital follow up ?Contact information: ?[address] ?[facility] ?[place] [address] ?[phone] ? ? ?  ?  ? ? Raj Janus, MD Follow up.   ?Specialty: Endocrinology ?Why: Hospital follow up ?Contact information: ?[address] ?[facility] ?[place] [address] ?[phone] ? ? ?  ?  ? ?  ?  ? ?  ? ?Discharge Exam: ?Filed Weights  ? 08/01/21 0843 08/01/21 1500  ?Weight: 95.7 kg 92.9 kg  ? ?General exam: Awake, laying in bed, in nad ?Respiratory system: Normal respiratory effort, no wheezing ?Cardiovascular system: regular rate, s1, s2 ?Gastrointestinal system: Soft, nondistended, positive BS ?Central nervous system: CN2-12 grossly intact, strength intact ?Extremities: Perfused, no clubbing ?Skin: Normal skin turgor, no notable skin lesions seen ?Psychiatry: Mood normal // no visual hallucinations  ? ?Condition at discharge: good ? ?The results of significant diagnostics from this hospitalization (including imaging, microbiology, ancillary and laboratory) are listed below for reference.  ? ?Imaging Studies: ?No results found. ? ?Microbiology: ?Results for orders placed or performed during the hospital encounter of 08/01/21  ?MRSA Next Gen by PCR, Nasal     Status: None  ? Collection Time: 08/01/21  2:43 PM  ? Specimen: Nasal Mucosa; Nasal Swab  ?Result Value Ref Range Status  ? MRSA by PCR Next Gen NOT DETECTED NOT DETECTED Final  ?  Comment: (NOTE) ?The GeneXpert MRSA Assay (FDA approved for NASAL specimens only), ?is one component of a comprehensive MRSA colonization surveillance ?program. It is not intended to diagnose MRSA infection nor to guide ?or monitor  treatment for MRSA infections. ?Test performance is not FDA approved in patients less than 2 years ?old. ?Performed at [facility], [address] [place], ?[place] [address] ?  ? ? ?Labs: ?CBC: ?Recent Labs  ?Lab 08/01/21 ?1007 08/01/21 ?1457 08/02/21 ?0134  ?WBC 8.7  --   --   ?NEUTROABS 6.3  --   --   ?HGB 15.7 14.7 14.8  ?HCT 46.8 43.5 42.7  ?MCV 96.9  --   --   ?PLT 159  --   --   ? ?Basic Metabolic Panel: ?Recent Labs  ?Lab 08/01/21 ?1457 08/01/21 ?1806 08/01/21 ?2153 08/02/21 ?0134 08/02/21 ?1219  ?NA 136 132* 134* 134* 135  ?K 3.5 3.7 3.7 3.5 3.3*  ?CL 102 100 102 102 102  ?CO2 21* 21* 24 24 25   ?GLUCOSE 78 148* 157* 166* 155*  ?BUN 15 16 14 14 13   ?CREATININE 0.75 0.74 0.71 0.70 0.64  ?  CALCIUM 8.8* 8.3* 8.5* 8.5* 8.5*  ? ?Liver Function Tests: ?Recent Labs  ?Lab 08/01/21 ?1610  ?AST 37  ?ALT 49*  ?ALKPHOS 89  ?BILITOT 1.6*  ?PROT 8.2*  ?ALBUMIN 4.4  ? ?CBG: ?Recent Labs  ?Lab 08/02/21 ?9604 08/02/21 ?0732 08/02/21 ?5409 08/02/21 ?1040 08/02/21 ?1137  ?GLUCAP 160* 147* 133* 182* 219*  ? ? ?Discharge time spent: less than 30 minutes. ? ?Signed: ?Rickey Barbara, MD ?[facility] ?08/02/2021 ?

## 2021-08-02 NOTE — TOC Initial Note (Signed)
Transition of Care (TOC) - Initial/Assessment Note  ? ? ?Patient Details  ?Name: Brendan Pruitt ?MRN: [id number] ?Date of Birth: 1975-08-21 ? ?Transition of Care (TOC) CM/SW Contact:    ?Shelbie Hutching, RN ?Phone Number: ?08/02/2021, 11:00 AM ? ?Clinical Narrative:                 ?Patient admitted to the hospital with DKA.  RNCM met with patient at the bedside, introduced self and explained role in DC planning.  TOC consult for medication assistance.   ?Patient is from home where he lives with his wife and 5 children.  Patient is independent at home and was supposed to go for a job interview yesterday but he ended up in the hospital.  Patient drives, wife also drives, she can pick him up at discharge.  Patient has insurance and prescription coverage.  He has no trouble affording his medications, he says his copay for the [place] was about $50 and he has been taking that, the glipizide and the Ozempic.  Diabetes educator will be coming to see patient and do some diabetic teaching today.   ? ? ? ?Expected Discharge Plan: Home/Self Care ?Barriers to Discharge: Continued Medical Work up ? ? ?Patient Goals and CMS Choice ?Patient states their goals for this hospitalization and ongoing recovery are:: wants education on his medications for diabetes ?  ?  ? ?Expected Discharge Plan and Services ?Expected Discharge Plan: Home/Self Care ?  ?Discharge Planning Services: CM Consult ?  ?Living arrangements for the past 2 months: Mobile Home ?                ?DME Arranged: N/A ?DME Agency: NA ?  ?  ?  ?HH Arranged: NA ?  ?  ?  ?  ? ?Prior Living Arrangements/Services ?Living arrangements for the past 2 months: Mobile Home ?Lives with:: Spouse, Adult Children, Minor Children ?Patient language and need for interpreter reviewed:: Yes ?Do you feel safe going back to the place where you live?: Yes      ?Need for Family Participation in Patient Care: Yes (Comment) (DM) ?Care giver support system in place?: Yes (comment) (wife) ?   ?Criminal Activity/Legal Involvement Pertinent to Current Situation/Hospitalization: No - Comment as needed ? ?Activities of Daily Living ?Home Assistive Devices/Equipment: Eyeglasses ?ADL Screening (condition at time of admission) ?Patient's cognitive ability adequate to safely complete daily activities?: Yes ?Is the patient deaf or have difficulty hearing?: No ?Does the patient have difficulty seeing, even when wearing glasses/contacts?: No ?Does the patient have difficulty concentrating, remembering, or making decisions?: No ?Patient able to express need for assistance with ADLs?: Yes ?Does the patient have difficulty dressing or bathing?: No ?Independently performs ADLs?: Yes (appropriate for developmental age) ?Does the patient have difficulty walking or climbing stairs?: No ?Weakness of Legs: None ?Weakness of Arms/Hands: None ? ?Permission Sought/Granted ?Permission sought to share information with : Case Manager, Family Supports ?Permission granted to share information with : Yes, Verbal Permission Granted ? Share Information with NAME: Sable Feil ?   ? Permission granted to share info w Relationship: spouse ? Permission granted to share info w Contact Information: [phone] ? ?Emotional Assessment ?Appearance:: Appears stated age ?Attitude/Demeanor/Rapport: Engaged ?Affect (typically observed): Accepting ?Orientation: : Oriented to Self, Oriented to Place, Oriented to  Time, Oriented to Situation ?Alcohol / Substance Use: Not Applicable ?Psych Involvement: No (comment) ? ?Admission diagnosis:  Dehydration [E86.0] ?DKA (diabetic ketoacidosis) ([place]) [E11.10] ?Type 2 diabetes mellitus with ketoacidosis without coma, without long-term  current use of insulin ([place]) [E11.10] ?Nausea and vomiting, unspecified vomiting type [R11.2] ?Patient Active Problem List  ? Diagnosis Date Noted  ? DKA (diabetic ketoacidosis) ([place]) 08/01/2021  ? Hematemesis 08/01/2021  ? Abscess of right foot 12/29/2020  ?  Acute osteomyelitis of metatarsal bone of right foot ([place]) 12/29/2020  ? Pain   ? Diabetic foot infection ([place]) 11/14/2020  ? Diabetic ulcer of right foot associated with type 2 diabetes mellitus, with fat layer exposed ([place]) 11/14/2020  ? Sepsis ([place]) 11/14/2020  ? Alcohol use 11/14/2020  ? Cellulitis of right foot 11/14/2020  ? HTN (hypertension) 11/14/2020  ? Diabetic neuropathy ([place]) 11/14/2020  ? Cellulitis of right lower extremity   ? Diabetes mellitus, type II ([place]) 09/12/2020  ? Ulcers of both lower legs ([place]) 09/12/2020  ? History of amputation of left great toe ([place]) 05/31/2020  ? Blurring of vision 03/11/2020  ? History of 2019 novel coronavirus disease (COVID-19) 03/11/2020  ? Diabetic polyneuropathy associated with type 2 diabetes mellitus ([place]) 10/17/2019  ? Hyperlipemia, mixed 08/02/2015  ? Tobacco use disorder 09/17/2014  ? ?PCP:  Tracie Harrier, MD ?Pharmacy:   ?[facility] [address] [place], [place] - [address] ?[address] ?[place] [address] ?Phone: [phone] Fax: [phone] ? ?[facility] [place], [place] AT [facility] OF [address] ?[place] ?[place] [address] ?Phone: [phone] Fax: [phone] ? ? ? ? ?Social Determinants of Health (SDOH) Interventions ?  ? ?Readmission Risk Interventions ?   ? View : No data to display.  ?  ?  ?  ? ? ? ?

## 2021-08-02 NOTE — Progress Notes (Signed)
Discharge instruction provided to patient regarding nausea and vomiting and Insulin management. Patient demonstrated use of insulin, and understanding of sliding scale insulin. Belongings returned to patient. Patient voices no concerns. Patient has transportation home. ?

## 2021-08-02 NOTE — Progress Notes (Signed)
PHARMACIST - PHYSICIAN COMMUNICATION ? ?CONCERNING: IV to Oral Route Change Policy ? ?RECOMMENDATION: ?This patient is receiving pantoprazole by the intravenous route.  Based on criteria approved by the Pharmacy and Therapeutics Committee, the intravenous medication(s) is/are being converted to the equivalent oral dose form(s). ? ? ?DESCRIPTION: ?These criteria include: ?The patient is eating (either orally or via tube) and/or has been taking other orally administered medications for a least 24 hours ?The patient has no evidence of active gastrointestinal bleeding or impaired GI absorption (gastrectomy, short bowel, patient on TNA or NPO). ? ?If you have questions about this conversion, please contact the Pharmacy Department  ? ?Tressie Ellis, RPH ?08/02/2021 8:36 AM  ?

## 2022-01-19 DIAGNOSIS — E113393 Type 2 diabetes mellitus with moderate nonproliferative diabetic retinopathy without macular edema, bilateral: Secondary | ICD-10-CM | POA: Diagnosis not present

## 2022-01-29 DIAGNOSIS — E1165 Type 2 diabetes mellitus with hyperglycemia: Secondary | ICD-10-CM | POA: Diagnosis not present

## 2022-01-29 DIAGNOSIS — E1142 Type 2 diabetes mellitus with diabetic polyneuropathy: Secondary | ICD-10-CM | POA: Diagnosis not present

## 2022-01-29 DIAGNOSIS — E113393 Type 2 diabetes mellitus with moderate nonproliferative diabetic retinopathy without macular edema, bilateral: Secondary | ICD-10-CM | POA: Diagnosis not present

## 2022-01-29 DIAGNOSIS — I10 Essential (primary) hypertension: Secondary | ICD-10-CM | POA: Diagnosis not present

## 2022-01-29 DIAGNOSIS — Z794 Long term (current) use of insulin: Secondary | ICD-10-CM | POA: Diagnosis not present

## 2022-01-29 DIAGNOSIS — E782 Mixed hyperlipidemia: Secondary | ICD-10-CM | POA: Diagnosis not present

## 2022-03-12 ENCOUNTER — Encounter (INDEPENDENT_AMBULATORY_CARE_PROVIDER_SITE_OTHER): Payer: Self-pay

## 2022-03-13 DIAGNOSIS — E1165 Type 2 diabetes mellitus with hyperglycemia: Secondary | ICD-10-CM | POA: Diagnosis not present

## 2022-03-13 DIAGNOSIS — E113393 Type 2 diabetes mellitus with moderate nonproliferative diabetic retinopathy without macular edema, bilateral: Secondary | ICD-10-CM | POA: Diagnosis not present

## 2022-03-13 DIAGNOSIS — E1142 Type 2 diabetes mellitus with diabetic polyneuropathy: Secondary | ICD-10-CM | POA: Diagnosis not present

## 2022-03-13 DIAGNOSIS — I10 Essential (primary) hypertension: Secondary | ICD-10-CM | POA: Diagnosis not present

## 2022-03-13 DIAGNOSIS — E782 Mixed hyperlipidemia: Secondary | ICD-10-CM | POA: Diagnosis not present

## 2022-03-13 DIAGNOSIS — Z794 Long term (current) use of insulin: Secondary | ICD-10-CM | POA: Diagnosis not present

## 2022-03-23 DIAGNOSIS — E1142 Type 2 diabetes mellitus with diabetic polyneuropathy: Secondary | ICD-10-CM | POA: Diagnosis not present

## 2022-03-23 DIAGNOSIS — L603 Nail dystrophy: Secondary | ICD-10-CM | POA: Diagnosis not present

## 2022-11-14 DIAGNOSIS — E1142 Type 2 diabetes mellitus with diabetic polyneuropathy: Secondary | ICD-10-CM | POA: Diagnosis not present

## 2022-11-14 DIAGNOSIS — L97911 Non-pressure chronic ulcer of unspecified part of right lower leg limited to breakdown of skin: Secondary | ICD-10-CM | POA: Diagnosis not present

## 2022-11-14 DIAGNOSIS — L03115 Cellulitis of right lower limb: Secondary | ICD-10-CM | POA: Diagnosis not present

## 2022-11-14 DIAGNOSIS — L603 Nail dystrophy: Secondary | ICD-10-CM | POA: Diagnosis not present

## 2022-11-14 DIAGNOSIS — L03116 Cellulitis of left lower limb: Secondary | ICD-10-CM | POA: Diagnosis not present

## 2022-11-14 DIAGNOSIS — M792 Neuralgia and neuritis, unspecified: Secondary | ICD-10-CM | POA: Diagnosis not present

## 2022-11-14 DIAGNOSIS — Z89412 Acquired absence of left great toe: Secondary | ICD-10-CM | POA: Diagnosis not present

## 2022-11-14 DIAGNOSIS — Z89421 Acquired absence of other right toe(s): Secondary | ICD-10-CM | POA: Diagnosis not present

## 2022-11-14 DIAGNOSIS — L97921 Non-pressure chronic ulcer of unspecified part of left lower leg limited to breakdown of skin: Secondary | ICD-10-CM | POA: Diagnosis not present

## 2022-11-28 DIAGNOSIS — E782 Mixed hyperlipidemia: Secondary | ICD-10-CM | POA: Diagnosis not present

## 2022-11-28 DIAGNOSIS — E1165 Type 2 diabetes mellitus with hyperglycemia: Secondary | ICD-10-CM | POA: Diagnosis not present

## 2022-11-28 DIAGNOSIS — I10 Essential (primary) hypertension: Secondary | ICD-10-CM | POA: Diagnosis not present

## 2022-11-28 DIAGNOSIS — I700 Atherosclerosis of aorta: Secondary | ICD-10-CM | POA: Diagnosis not present

## 2022-11-28 DIAGNOSIS — Z6835 Body mass index (BMI) 35.0-35.9, adult: Secondary | ICD-10-CM | POA: Diagnosis not present

## 2022-11-28 DIAGNOSIS — Z89412 Acquired absence of left great toe: Secondary | ICD-10-CM | POA: Diagnosis not present

## 2022-11-28 DIAGNOSIS — R400 Somnolence: Secondary | ICD-10-CM | POA: Diagnosis not present

## 2022-11-28 DIAGNOSIS — Z89421 Acquired absence of other right toe(s): Secondary | ICD-10-CM | POA: Diagnosis not present

## 2022-11-28 DIAGNOSIS — Z79899 Other long term (current) drug therapy: Secondary | ICD-10-CM | POA: Diagnosis not present

## 2022-11-28 DIAGNOSIS — E1142 Type 2 diabetes mellitus with diabetic polyneuropathy: Secondary | ICD-10-CM | POA: Diagnosis not present

## 2023-02-11 IMAGING — DX DG FOOT 2V*R*
2 series · 2 of 2 positions shown · non-contrast
Comparison: Radiograph 11/14/2020

CLINICAL DATA: Status post right fifth metatarsal amputation

EXAM:
RIGHT FOOT - 2 VIEW

[foot ap]
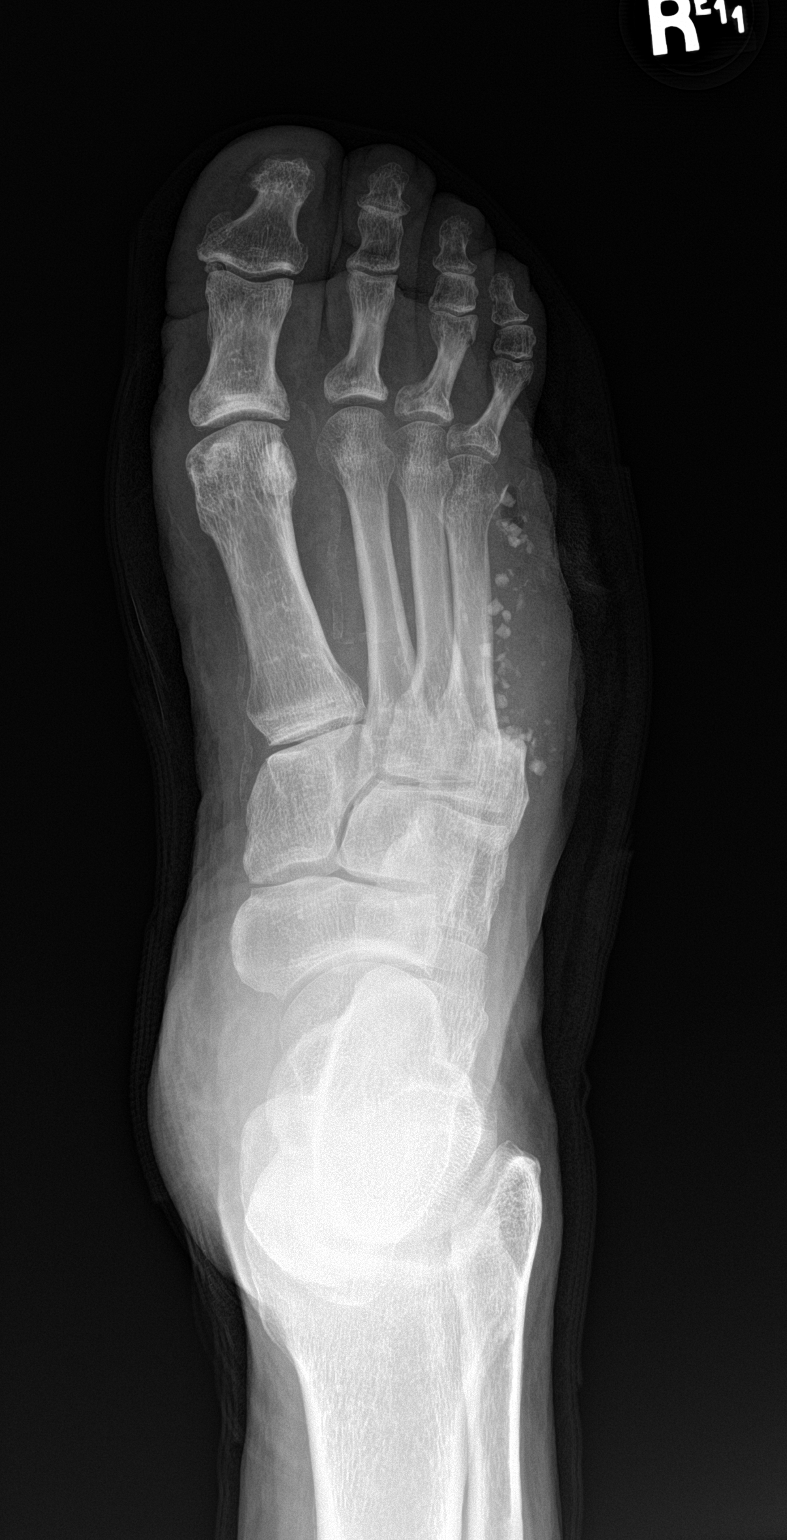

[foot lat]
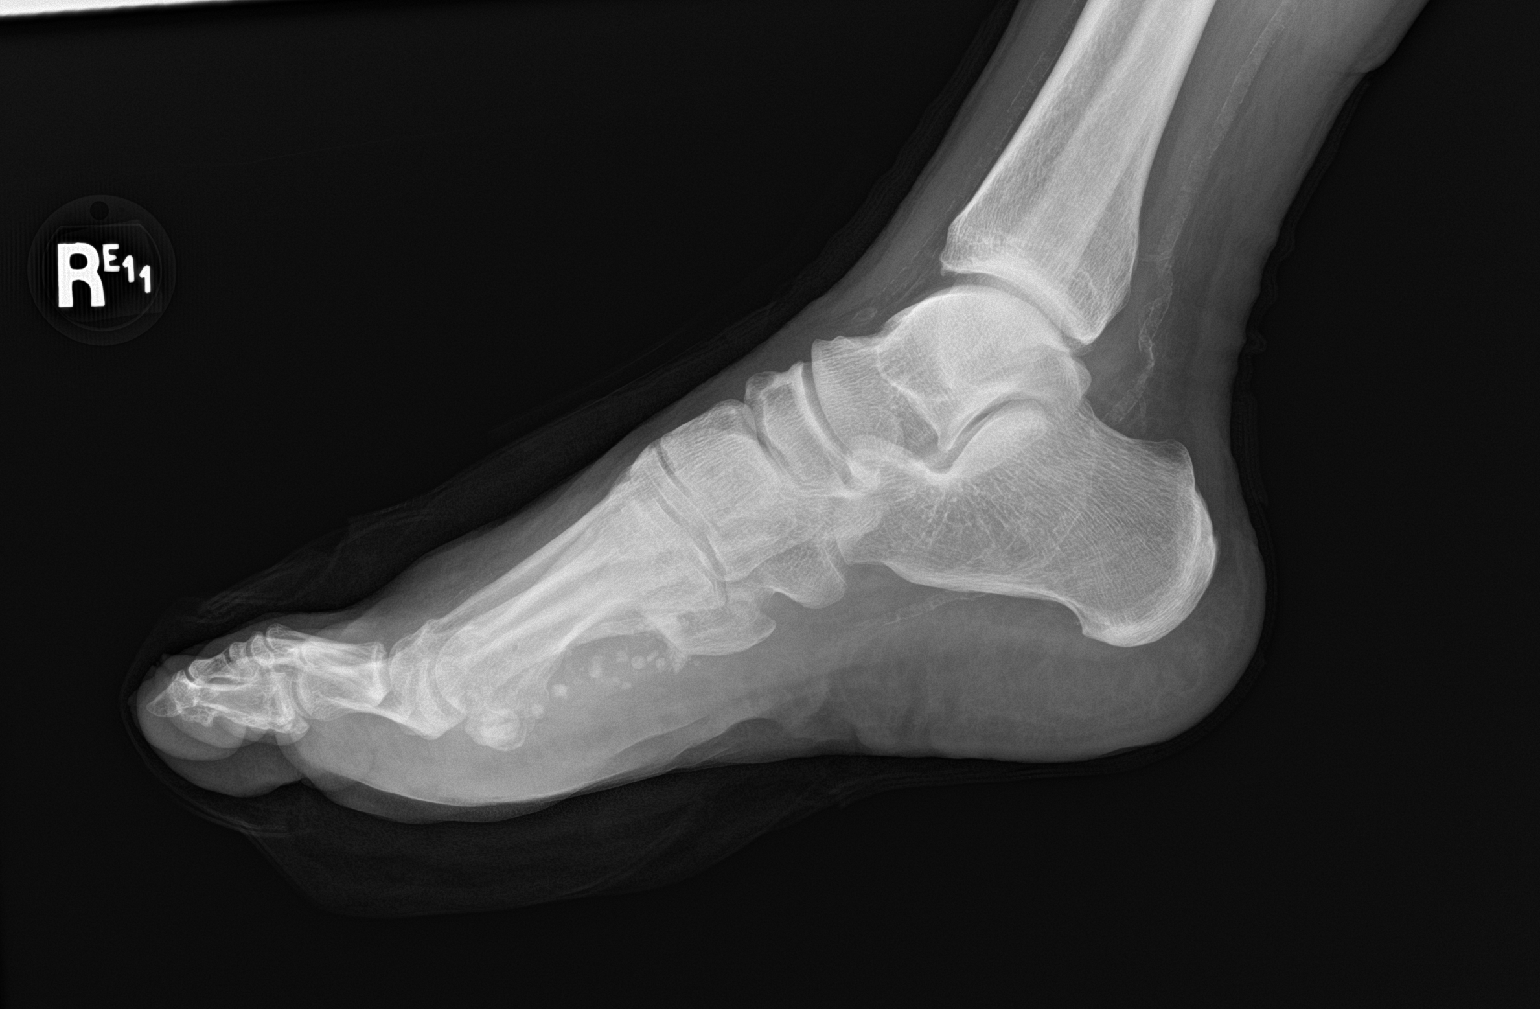

[2 of 2 positions shown; findings below may reference images not displayed]

FINDINGS: Postsurgical changes of partial fifth ray amputation with residual
base of fifth metatarsal. There are tiny radiopaque pellets in the
surgical site, likely antibiotic beads. Expected postsurgical soft
tissue changes. Vascular calcifications.
IMPRESSION: Postsurgical changes of partial fifth ray metatarsal amputation with
antibiotic bead placement. Expected postsurgical soft tissue
changes.

## 2023-08-14 ENCOUNTER — Other Ambulatory Visit: Payer: Self-pay | Admitting: Internal Medicine

## 2023-08-14 DIAGNOSIS — R748 Abnormal levels of other serum enzymes: Secondary | ICD-10-CM

## 2023-08-29 ENCOUNTER — Ambulatory Visit

## 2023-09-03 ENCOUNTER — Encounter: Payer: Self-pay | Admitting: Cardiology

## 2023-09-03 ENCOUNTER — Ambulatory Visit
Admission: RE | Admit: 2023-09-03 | Discharge: 2023-09-03 | Disposition: A | Discharge status: Home / Self Care | Payer: Self-pay | Attending: Cardiology | Admitting: Cardiology

## 2023-09-03 ENCOUNTER — Encounter
Admission: RE | Disposition: A | Discharge status: Home / Self Care | Payer: Self-pay | Source: Home / Self Care | Attending: Cardiology

## 2023-09-03 ENCOUNTER — Other Ambulatory Visit: Payer: Self-pay

## 2023-09-03 DIAGNOSIS — I2510 Atherosclerotic heart disease of native coronary artery without angina pectoris: Secondary | ICD-10-CM | POA: Insufficient documentation

## 2023-09-03 DIAGNOSIS — R9439 Abnormal result of other cardiovascular function study: Secondary | ICD-10-CM | POA: Diagnosis present

## 2023-09-03 DIAGNOSIS — I2582 Chronic total occlusion of coronary artery: Secondary | ICD-10-CM | POA: Diagnosis not present

## 2023-09-03 HISTORY — PX: LEFT HEART CATH AND CORONARY ANGIOGRAPHY: CATH118249

## 2023-09-03 LAB — GLUCOSE, CAPILLARY
Glucose-Capillary: 392 mg/dL — ABNORMAL HIGH (ref 70–99)
Glucose-Capillary: 332 mg/dL — ABNORMAL HIGH (ref 70–99)

## 2023-09-03 SURGERY — LEFT HEART CATH AND CORONARY ANGIOGRAPHY
Anesthesia: Moderate Sedation | Laterality: Left

## 2023-09-03 MED ORDER — SODIUM CHLORIDE 0.9 % WEIGHT BASED INFUSION
3.0000 mL/kg/h | INTRAVENOUS | Status: AC
  Administered 2023-09-03: 3 mL/kg/h via INTRAVENOUS

## 2023-09-03 MED ORDER — FENTANYL CITRATE (PF) 100 MCG/2ML IJ SOLN
INTRAMUSCULAR | Status: AC
  Filled 2023-09-03: qty 2

## 2023-09-03 MED ORDER — SODIUM CHLORIDE 0.9 % WEIGHT BASED INFUSION
1.0000 mL/kg/h | INTRAVENOUS | Status: DC
Start: 2023-09-03 — End: 2023-09-03
  Administered 2023-09-03: 1 mL/kg/h via INTRAVENOUS

## 2023-09-03 MED ORDER — FENTANYL CITRATE (PF) 100 MCG/2ML IJ SOLN
INTRAMUSCULAR | Status: DC | PRN
  Administered 2023-09-03: 25 ug via INTRAVENOUS

## 2023-09-03 MED ORDER — LIDOCAINE HCL (PF) 1 % IJ SOLN
INTRAMUSCULAR | Status: DC | PRN
  Administered 2023-09-03: 2 mL

## 2023-09-03 MED ORDER — MIDAZOLAM HCL 2 MG/2ML IJ SOLN
INTRAMUSCULAR | Status: DC | PRN
  Administered 2023-09-03: 1 mg via INTRAVENOUS

## 2023-09-03 MED ORDER — SODIUM CHLORIDE 0.9% FLUSH: 3.0000 mL | Freq: Two times a day (BID) | INTRAVENOUS | Status: DC

## 2023-09-03 MED ORDER — ASPIRIN 81 MG PO CHEW
81.0000 mg | CHEWABLE_TABLET | ORAL | Status: AC
  Administered 2023-09-03: 81 mg via ORAL

## 2023-09-03 MED ORDER — LABETALOL HCL 5 MG/ML IV SOLN: 10.0000 mg | INTRAVENOUS | Status: DC | PRN

## 2023-09-03 MED ORDER — VERAPAMIL HCL 2.5 MG/ML IV SOLN
INTRAVENOUS | Status: DC | PRN
  Administered 2023-09-03: 2.5 mg via INTRA_ARTERIAL

## 2023-09-03 MED ORDER — HEPARIN SODIUM (PORCINE) 1000 UNIT/ML IJ SOLN
INTRAMUSCULAR | Status: DC | PRN
  Administered 2023-09-03: 4000 [IU] via INTRAVENOUS

## 2023-09-03 MED ORDER — HEPARIN (PORCINE) IN NACL 1000-0.9 UT/500ML-% IV SOLN
INTRAVENOUS | Status: DC | PRN
  Administered 2023-09-03: 1000 mL

## 2023-09-03 MED ORDER — HEPARIN SODIUM (PORCINE) 1000 UNIT/ML IJ SOLN
INTRAMUSCULAR | Status: AC
  Filled 2023-09-03: qty 10

## 2023-09-03 MED ORDER — MIDAZOLAM HCL 2 MG/2ML IJ SOLN
INTRAMUSCULAR | Status: AC
  Filled 2023-09-03: qty 2

## 2023-09-03 MED ORDER — ACETAMINOPHEN 325 MG PO TABS: 650.0000 mg | ORAL_TABLET | ORAL | Status: DC | PRN

## 2023-09-03 MED ORDER — HEPARIN (PORCINE) IN NACL 1000-0.9 UT/500ML-% IV SOLN
INTRAVENOUS | Status: AC
  Filled 2023-09-03: qty 1000

## 2023-09-03 MED ORDER — VERAPAMIL HCL 2.5 MG/ML IV SOLN
INTRAVENOUS | Status: AC
  Filled 2023-09-03: qty 2

## 2023-09-03 MED ORDER — SODIUM CHLORIDE 0.9% FLUSH: 3.0000 mL | INTRAVENOUS | Status: DC | PRN

## 2023-09-03 MED ORDER — ONDANSETRON HCL 4 MG/2ML IJ SOLN: 4.0000 mg | Freq: Four times a day (QID) | INTRAMUSCULAR | Status: DC | PRN

## 2023-09-03 MED ORDER — SODIUM CHLORIDE 0.9 % IV SOLN: 250.0000 mL | INTRAVENOUS | Status: DC | PRN

## 2023-09-03 MED ORDER — HYDRALAZINE HCL 20 MG/ML IJ SOLN: 10.0000 mg | INTRAMUSCULAR | Status: DC | PRN

## 2023-09-03 MED ORDER — LIDOCAINE HCL 1 % IJ SOLN
INTRAMUSCULAR | Status: AC
Start: 2023-09-03 — End: ?
  Filled 2023-09-03: qty 20

## 2023-09-03 MED ORDER — SODIUM CHLORIDE 0.9 % WEIGHT BASED INFUSION: 1.0000 mL/kg/h | INTRAVENOUS | Status: DC

## 2023-09-03 MED ORDER — ASPIRIN 81 MG PO CHEW
CHEWABLE_TABLET | ORAL | Status: AC
  Filled 2023-09-03: qty 1

## 2023-09-03 MED ORDER — IOHEXOL 300 MG/ML  SOLN
INTRAMUSCULAR | Status: DC | PRN
  Administered 2023-09-03: 119 mL

## 2023-09-03 SURGICAL SUPPLY — 12 items
CATH INFINITI 5 FR JL3.5 (CATHETERS) IMPLANT
CATH INFINITI AMBI 5FR JK (CATHETERS) IMPLANT
CATH INFINITI JR4 5F (CATHETERS) IMPLANT
CATH LAUNCHER 5F EBU3.0 (CATHETERS) IMPLANT
DEVICE RAD TR BAND REGULAR (VASCULAR PRODUCTS) IMPLANT
DRAPE BRACHIAL (DRAPES) IMPLANT
GLIDESHEATH SLEND SS 6F .021 (SHEATH) IMPLANT
GUIDEWIRE INQWIRE 1.5J.035X260 (WIRE) IMPLANT
KIT SYRINGE INJ CVI SPIKEX1 (MISCELLANEOUS) IMPLANT
PACK CARDIAC CATH (CUSTOM PROCEDURE TRAY) ×1 IMPLANT
SET ATX-X65L (MISCELLANEOUS) IMPLANT
STATION PROTECTION PRESSURIZED (MISCELLANEOUS) IMPLANT

## 2023-09-03 NOTE — Progress Notes (Signed)
 Verbal order from Dr. Paraschos to let pt administer home insulin  dose. Dose taken before meal for sugar of 332

## 2023-09-20 ENCOUNTER — Ambulatory Visit
Admission: RE | Admit: 2023-09-20 | Discharge: 2023-09-20 | Disposition: A | Discharge status: Home / Self Care | Source: Ambulatory Visit | Attending: Internal Medicine | Admitting: Internal Medicine

## 2023-09-20 DIAGNOSIS — R748 Abnormal levels of other serum enzymes: Secondary | ICD-10-CM | POA: Diagnosis present
